# Patient Record
Sex: Male | Born: 1960 | Race: White | Hispanic: No | Marital: Single | Smoking: Never smoker
Health system: Southern US, Community
[De-identification: ages and names within clinical notes are randomized; demographics above are authoritative.]

## PROBLEM LIST (undated history)

## (undated) DIAGNOSIS — F32A Depression, unspecified: Secondary | ICD-10-CM

## (undated) DIAGNOSIS — K219 Gastro-esophageal reflux disease without esophagitis: Secondary | ICD-10-CM

## (undated) DIAGNOSIS — I10 Essential (primary) hypertension: Secondary | ICD-10-CM

## (undated) DIAGNOSIS — F329 Major depressive disorder, single episode, unspecified: Secondary | ICD-10-CM

## (undated) HISTORY — DX: Essential (primary) hypertension: I10

## (undated) HISTORY — DX: Depression, unspecified: F32.A

## (undated) HISTORY — DX: Major depressive disorder, single episode, unspecified: F32.9

## (undated) HISTORY — DX: Gastro-esophageal reflux disease without esophagitis: K21.9

---

## 2005-09-03 ENCOUNTER — Ambulatory Visit: Payer: Self-pay | Admitting: Unknown Physician Specialty

## 2009-05-10 ENCOUNTER — Ambulatory Visit: Payer: Self-pay | Admitting: Family Medicine

## 2009-05-11 ENCOUNTER — Ambulatory Visit: Payer: Self-pay | Admitting: Family Medicine

## 2009-07-13 ENCOUNTER — Encounter: Admission: RE | Admit: 2009-07-13 | Discharge: 2009-07-13 | Payer: Self-pay | Admitting: Occupational Medicine

## 2009-12-20 IMAGING — CR DG CHEST 2V
1 series · 3 of 3 positions shown · non-contrast
Comparison: none

REASON FOR EXAM: Pain due to trauma, left lower rib detail
COMMENTS:

[Series 1: view not recorded · 0.17mm/px · 3 of 3 slices shown]
[im 1/3]
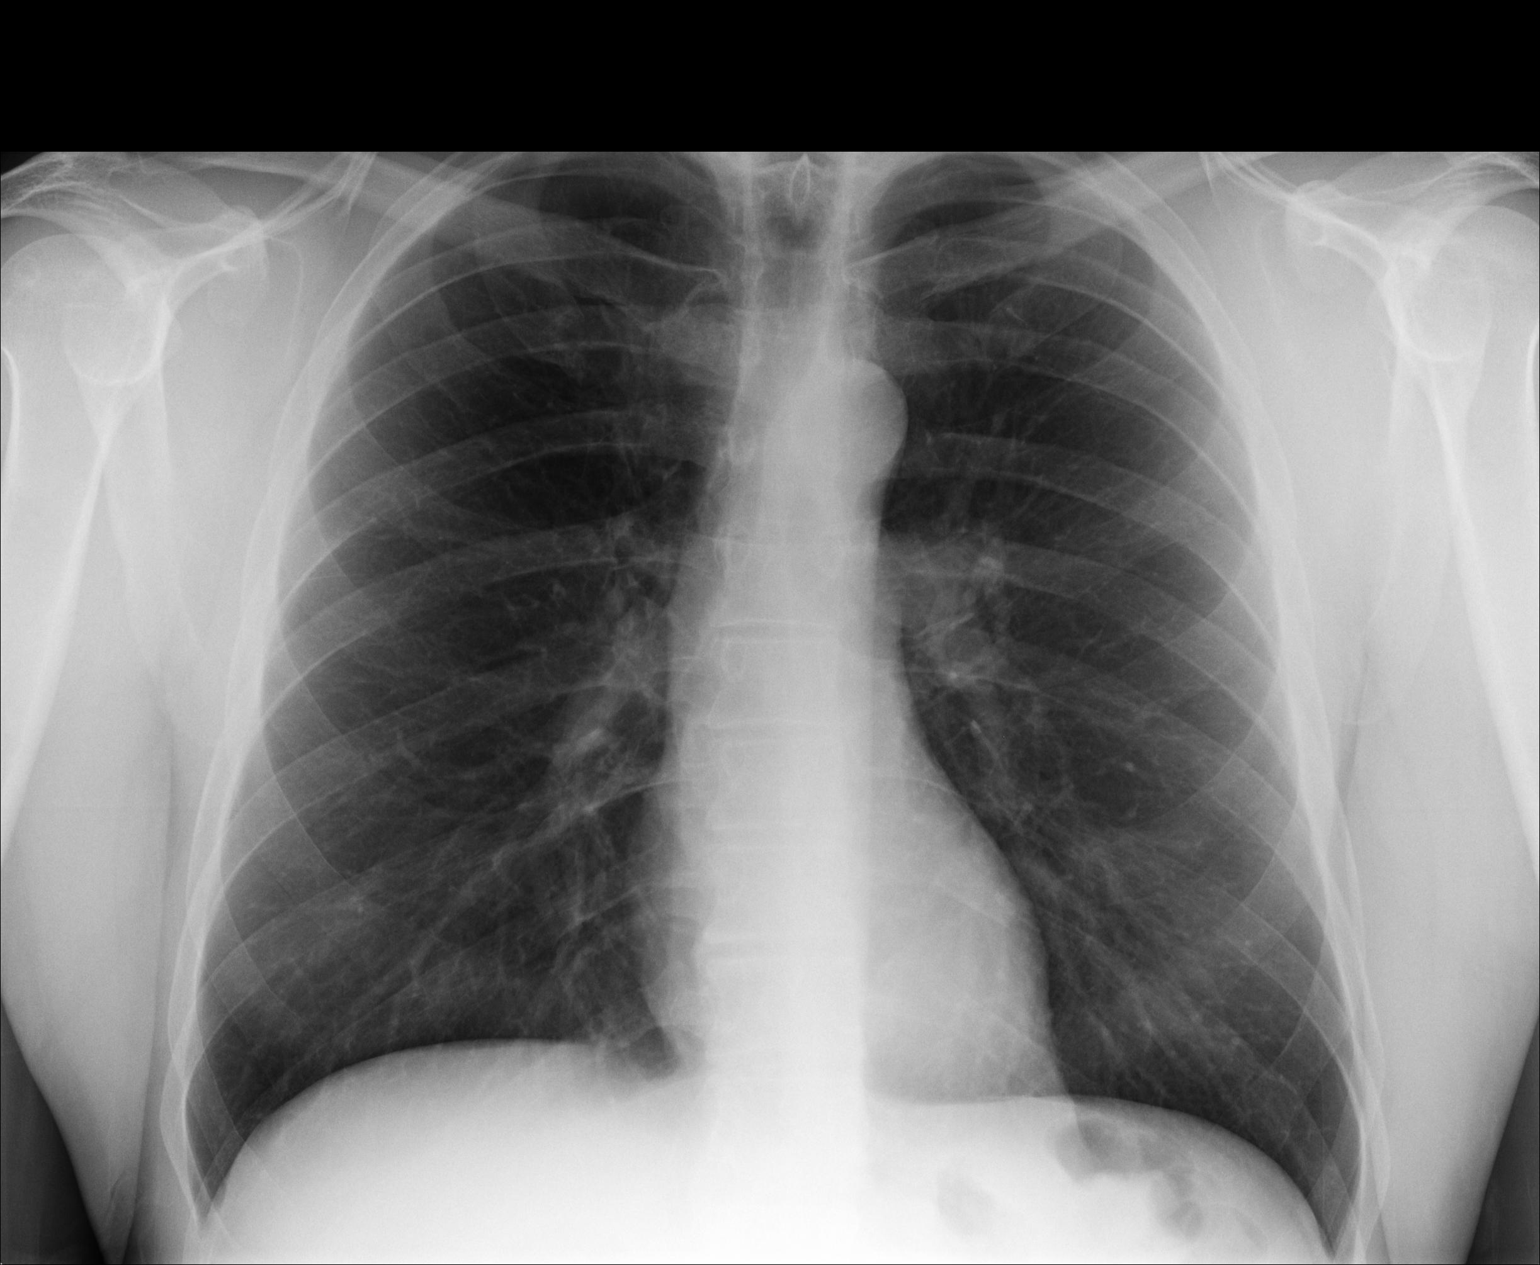
[im 2/3]
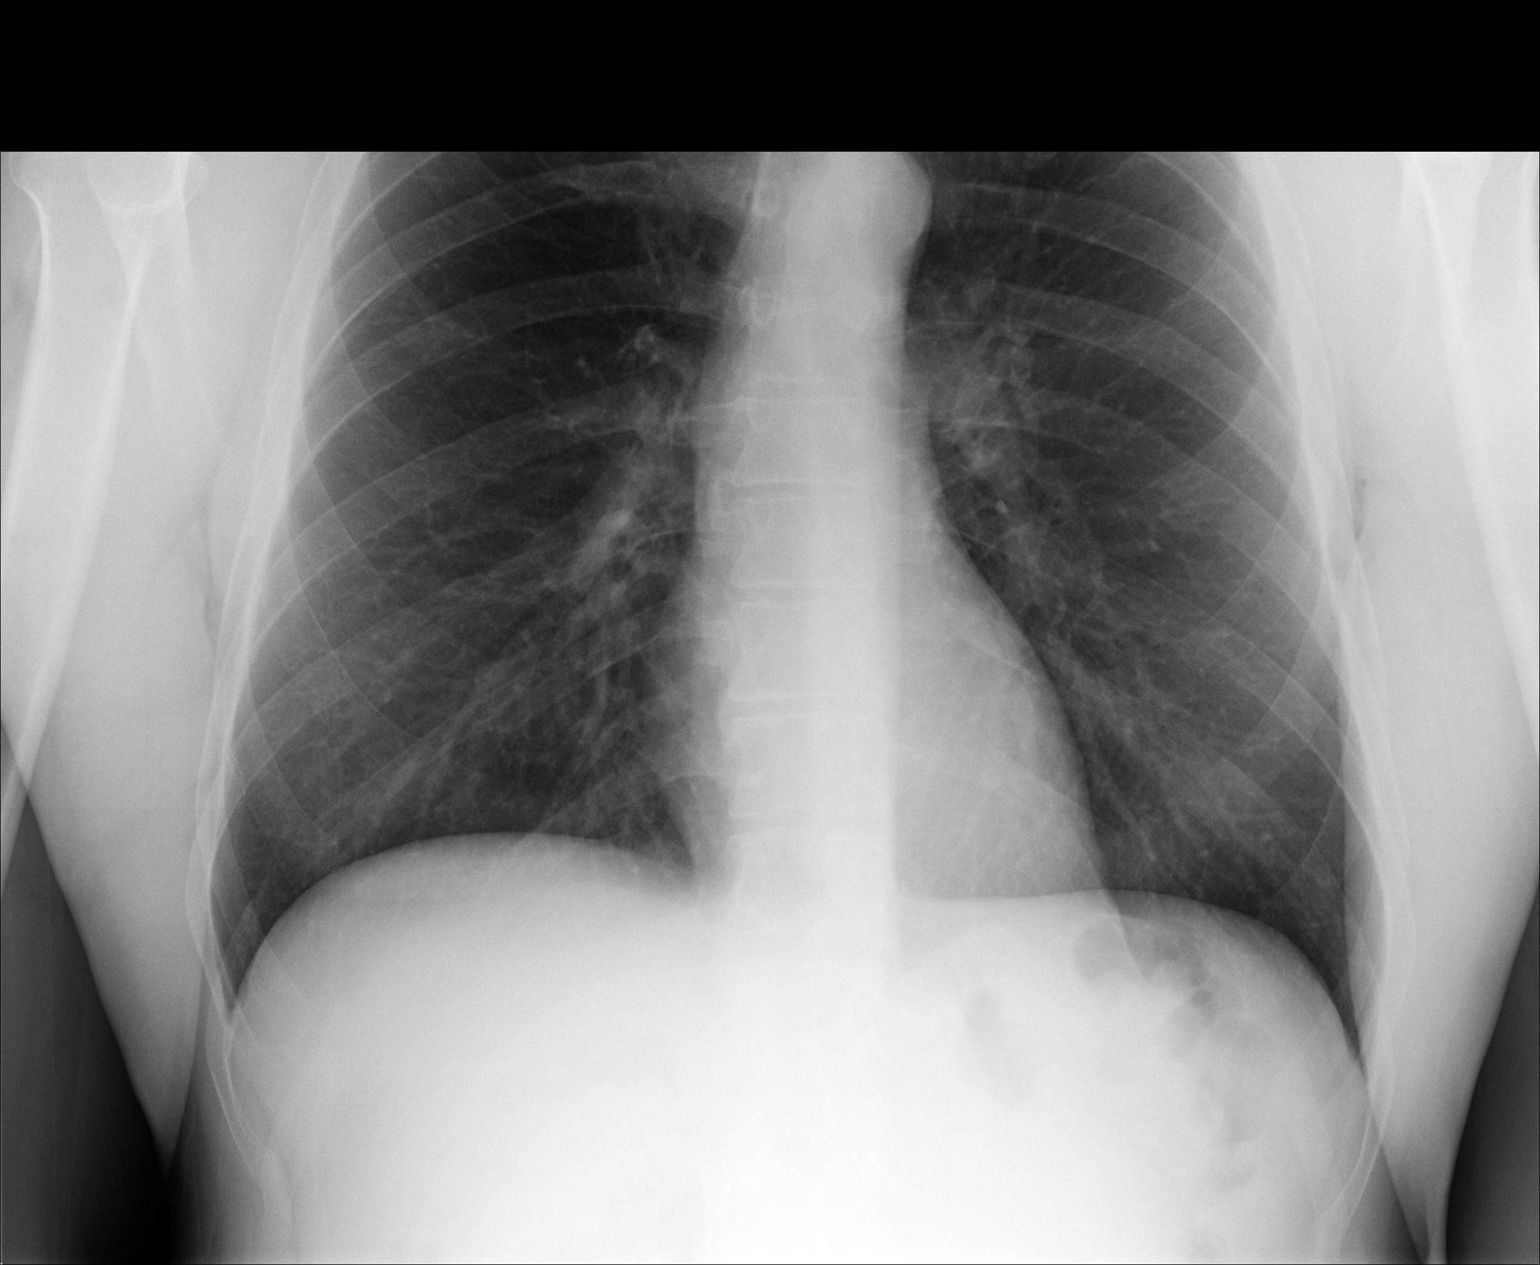
[im 3/3]
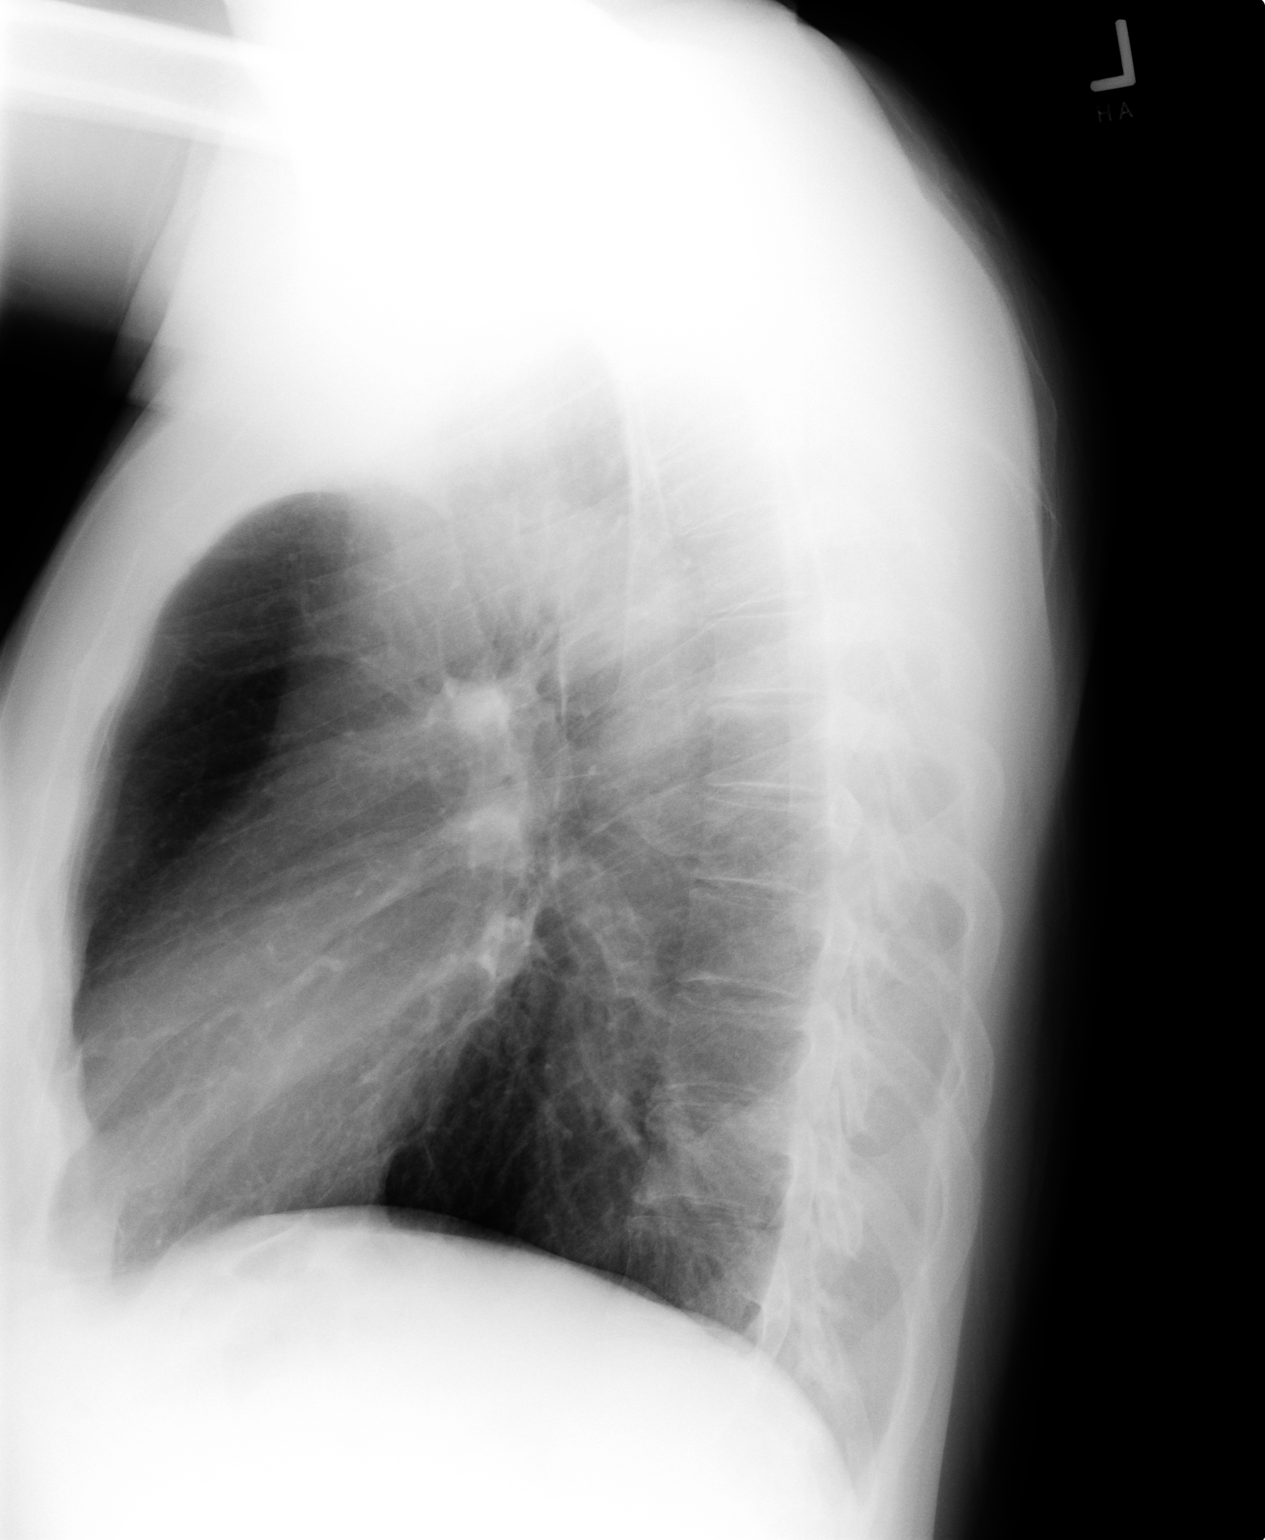

[3 of 3 positions shown; findings below may reference images not displayed]

PROCEDURE:     KDR - KDXR CHEST PA (OR AP) AND LAT  - May 10, 2009 [DATE]

RESULT:     The lung fields are clear. No pneumonia, pneumothorax or pleural
fusion is seen. The chest appears mildly hyperexpanded suspicious for a
history of COPD or asthma. No rib fracture is identified on the current
chest radiograph.
IMPRESSION: 1. No acute changes are identified.
2. The chest appears hyperexpanded.

## 2009-12-21 IMAGING — CT CT ABDOMEN W/ CM
1 of 2 series · 15 of 32 positions shown, 19 images · non-contrast
Comparison: None

REASON FOR EXAM: abd pain injury to ribs eval for rib fx or spleen trauma
COMMENTS:

PROCEDURE:     CT  - CT ABDOMEN STANDARD W  - May 11, 2009  [DATE]
RESULT:     History: Abdominal pain
TECHNIQUE: Multiple axial images of the abdomen were performed from the lung
bases to the iliac crests, with p.o. contrast and with one or milliliters of
Bsovue-AOR intravenous contrast.

[Series 2: abdomen · axial · 0.72mm/px · z∈[-952,-652]mm · 15 of 66 slices shown, 19 images]
[im 3/66  soft-tissue]
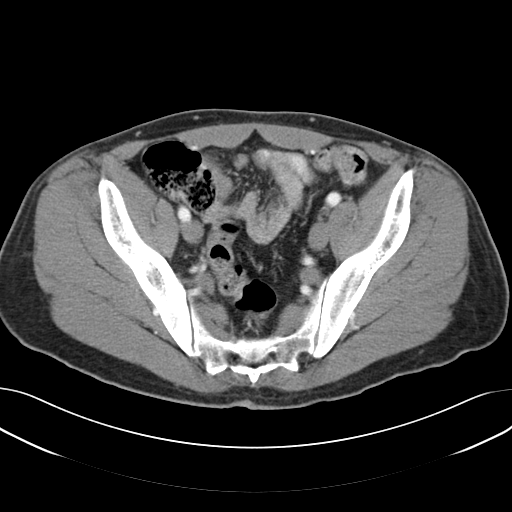
[im 3/66  bone]
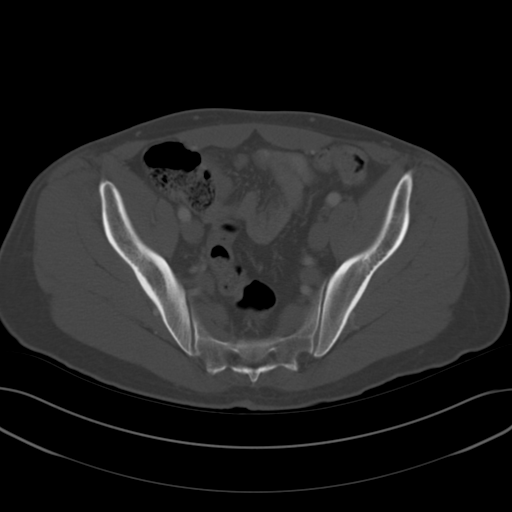
[im 9/66  soft-tissue]
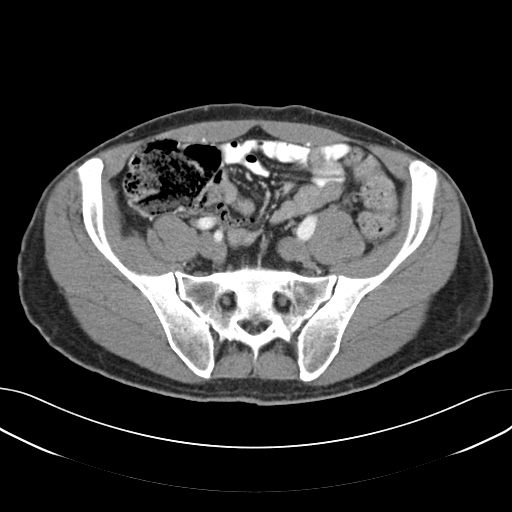
[im 14/66  soft-tissue]
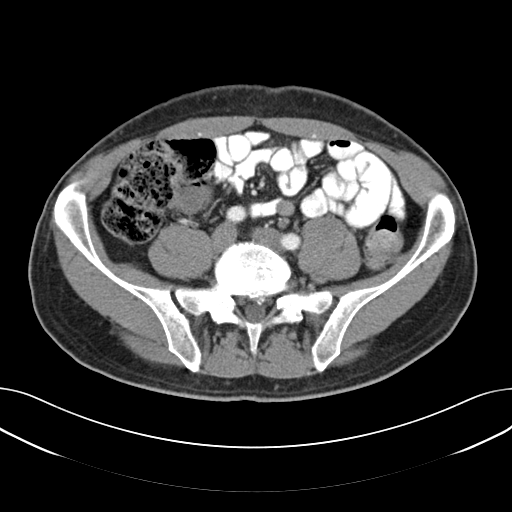
[im 19/66  soft-tissue]
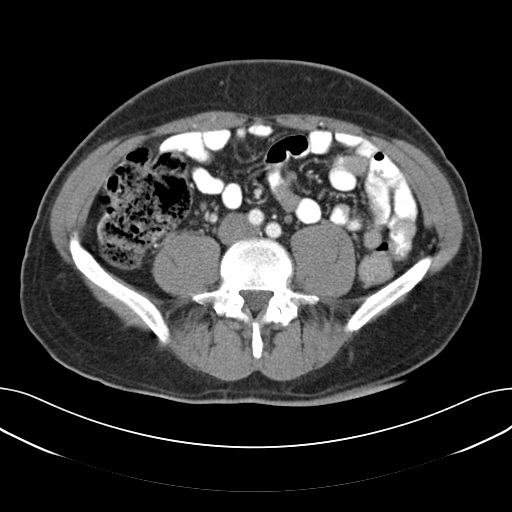
[im 22/66  soft-tissue]
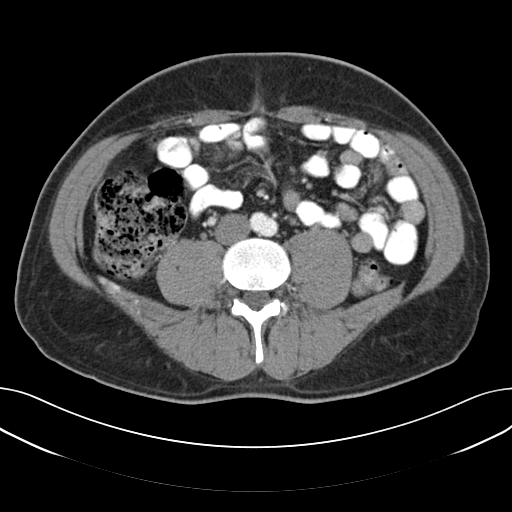
[im 28/66  soft-tissue]
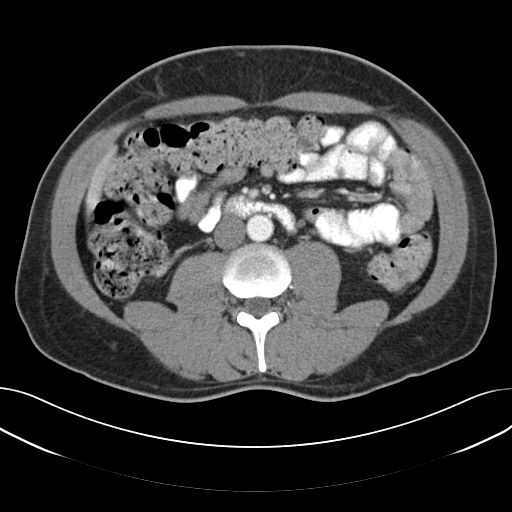
[im 33/66  soft-tissue]
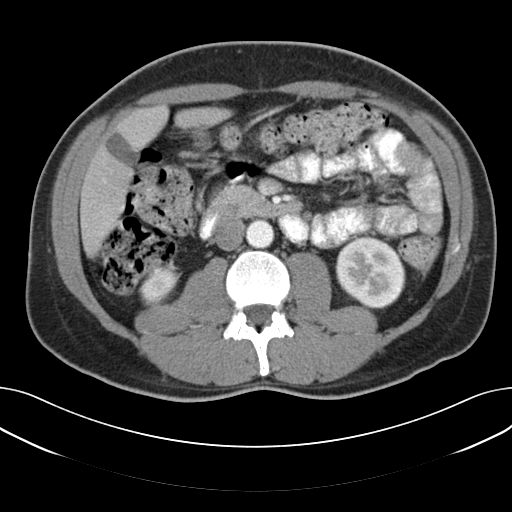
[im 38/66  soft-tissue]
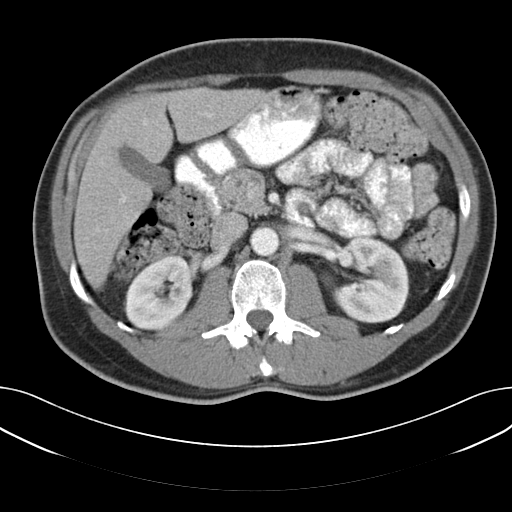
[im 44/66  soft-tissue]
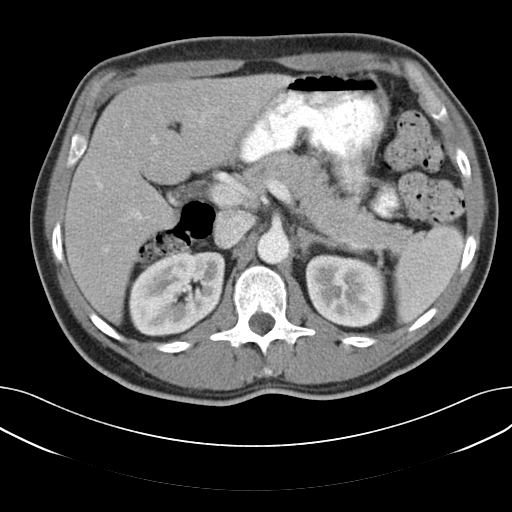
[im 44/66  bone]
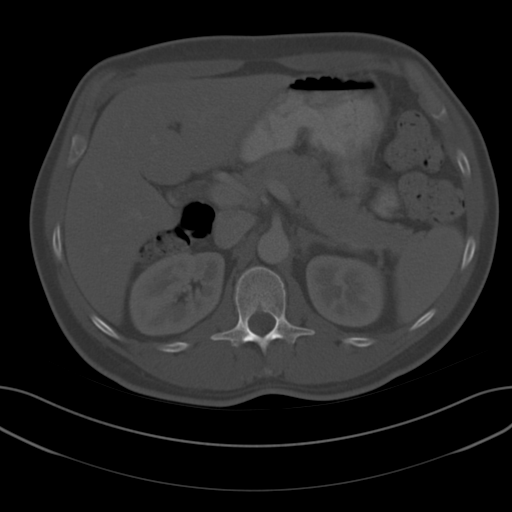
[im 47/66  soft-tissue]
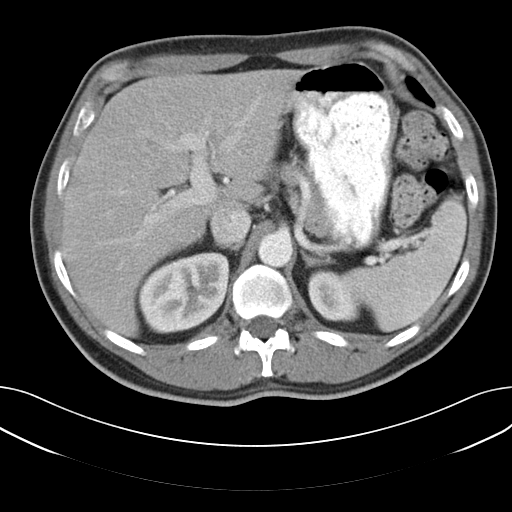
[im 52/66  soft-tissue]
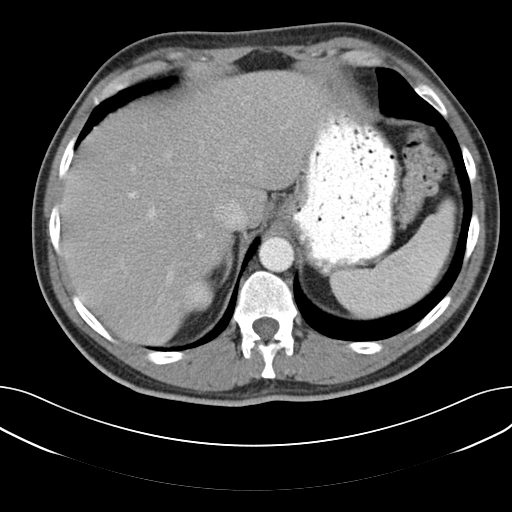
[im 55/66  lung]
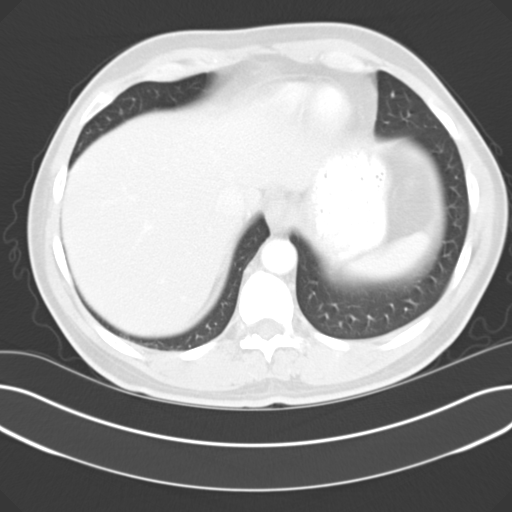
[im 57/66  soft-tissue]
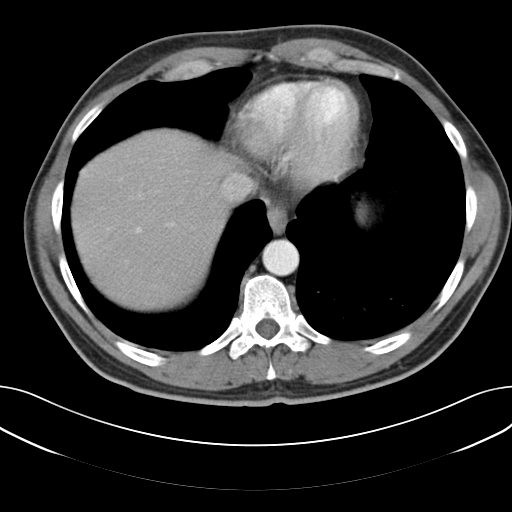
[im 57/66  lung]
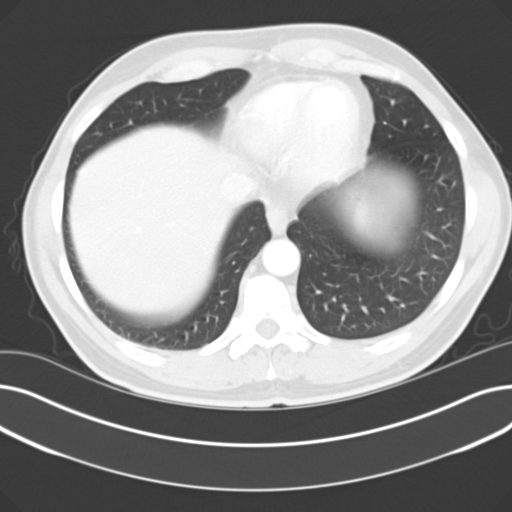
[im 60/66  lung]
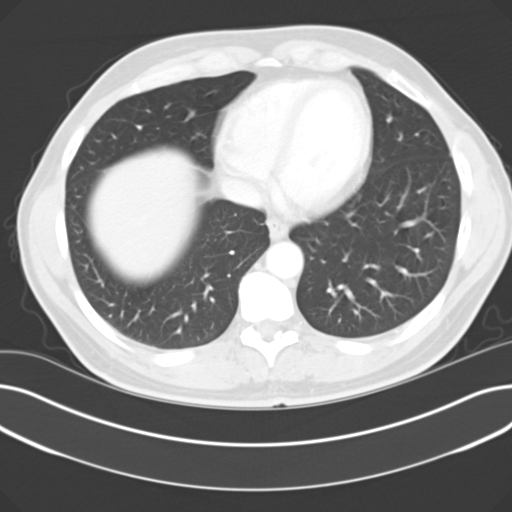
[im 63/66  soft-tissue]
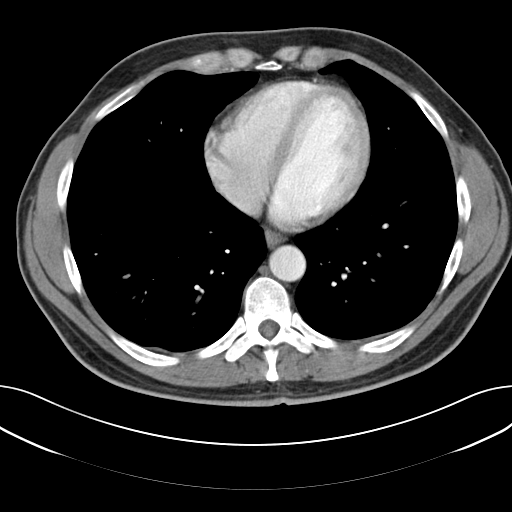
[im 63/66  lung]
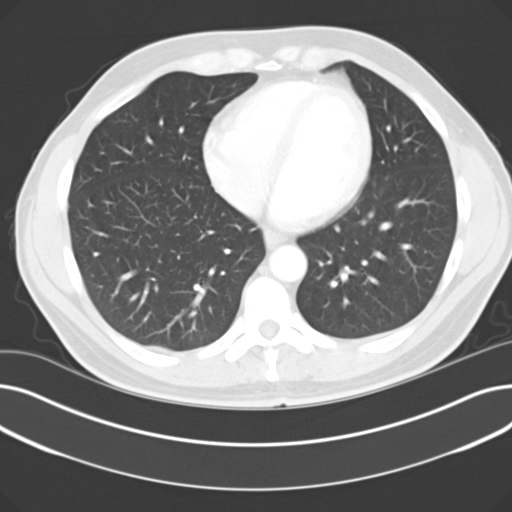

[15 of 32 positions shown; findings below may reference images not displayed]

FINDINGS: The lung bases are clear. There is no pneumothorax. The heart size is
normal.

The liver demonstrates no focal abnormality. There is no intrahepatic or
extrahepatic biliary ductal dilatation. The gallbladder is unremarkable. The
spleen demonstrates no focal abnormality. The kidneys, adrenal glands, and
pancreas are normal.

The visualized portions of the stomach, duodenum, small intestine, and large
intestine demonstrate no contrast extravasation or dilatation. There is no
pneumoperitoneum, pneumatosis, or portal venous gas. There is no abdominal
free fluid. There is no lymphadenopathy.

The abdominal aorta is normal in caliber.

The osseous structures are unremarkable.
IMPRESSION: No acute abdominal injury.

## 2010-03-04 ENCOUNTER — Ambulatory Visit: Payer: Self-pay | Admitting: Family Medicine

## 2010-10-14 IMAGING — CR LEFT INDEX FINGER 2+V
1 series · 3 of 3 positions shown · non-contrast
Comparison: none

REASON FOR EXAM: pain from trauma
COMMENTS:

[Series 1: view not recorded · 0.17mm/px · 3 of 3 slices shown]
[im 1/3]
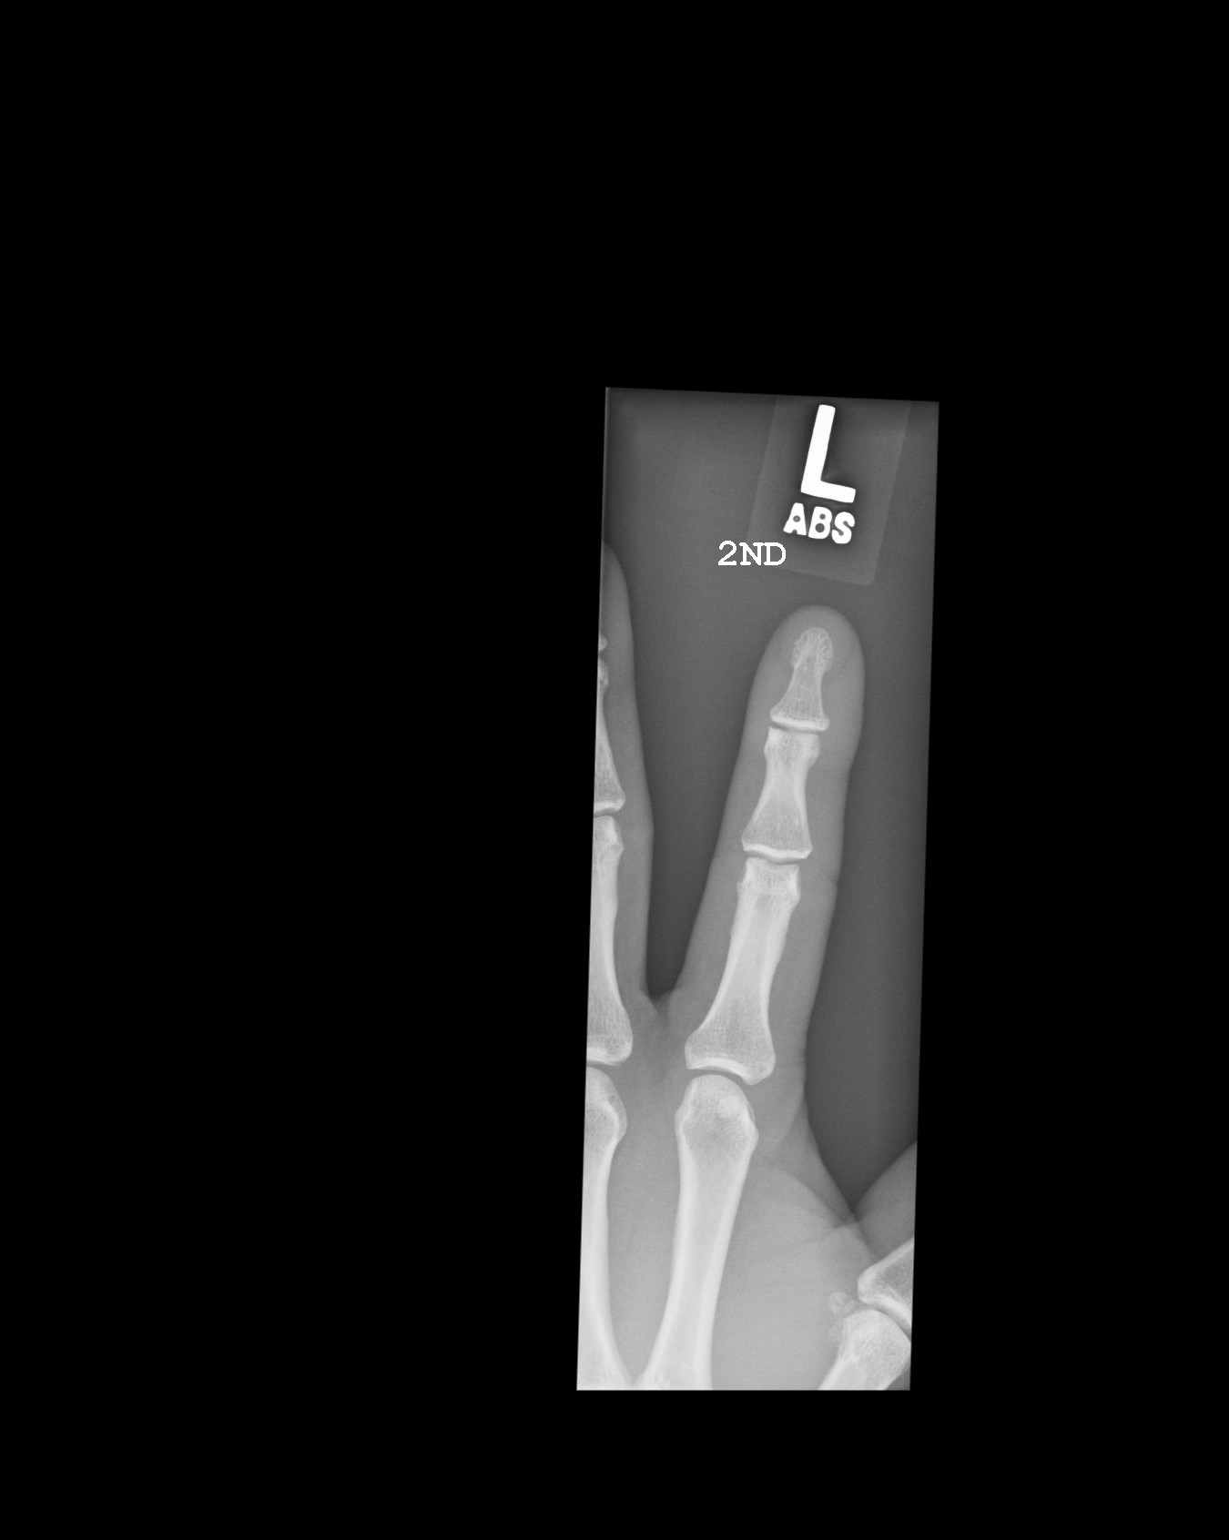
[im 2/3]
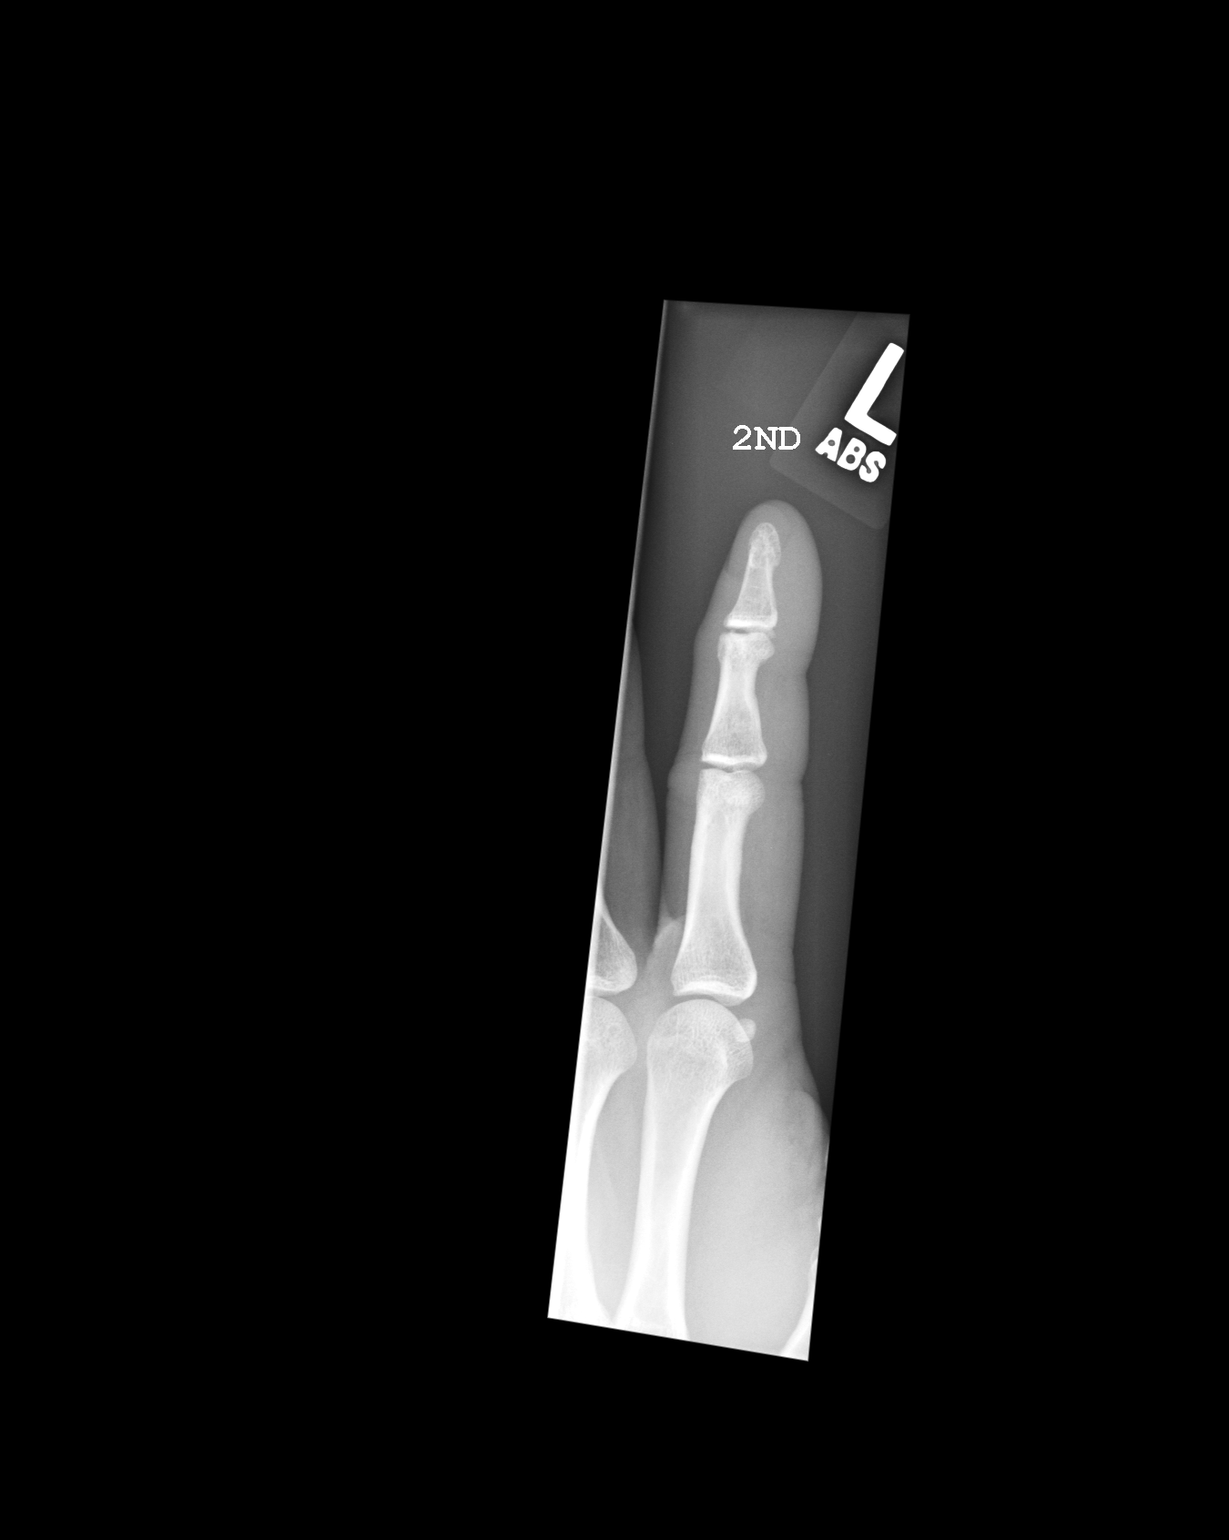
[im 3/3]
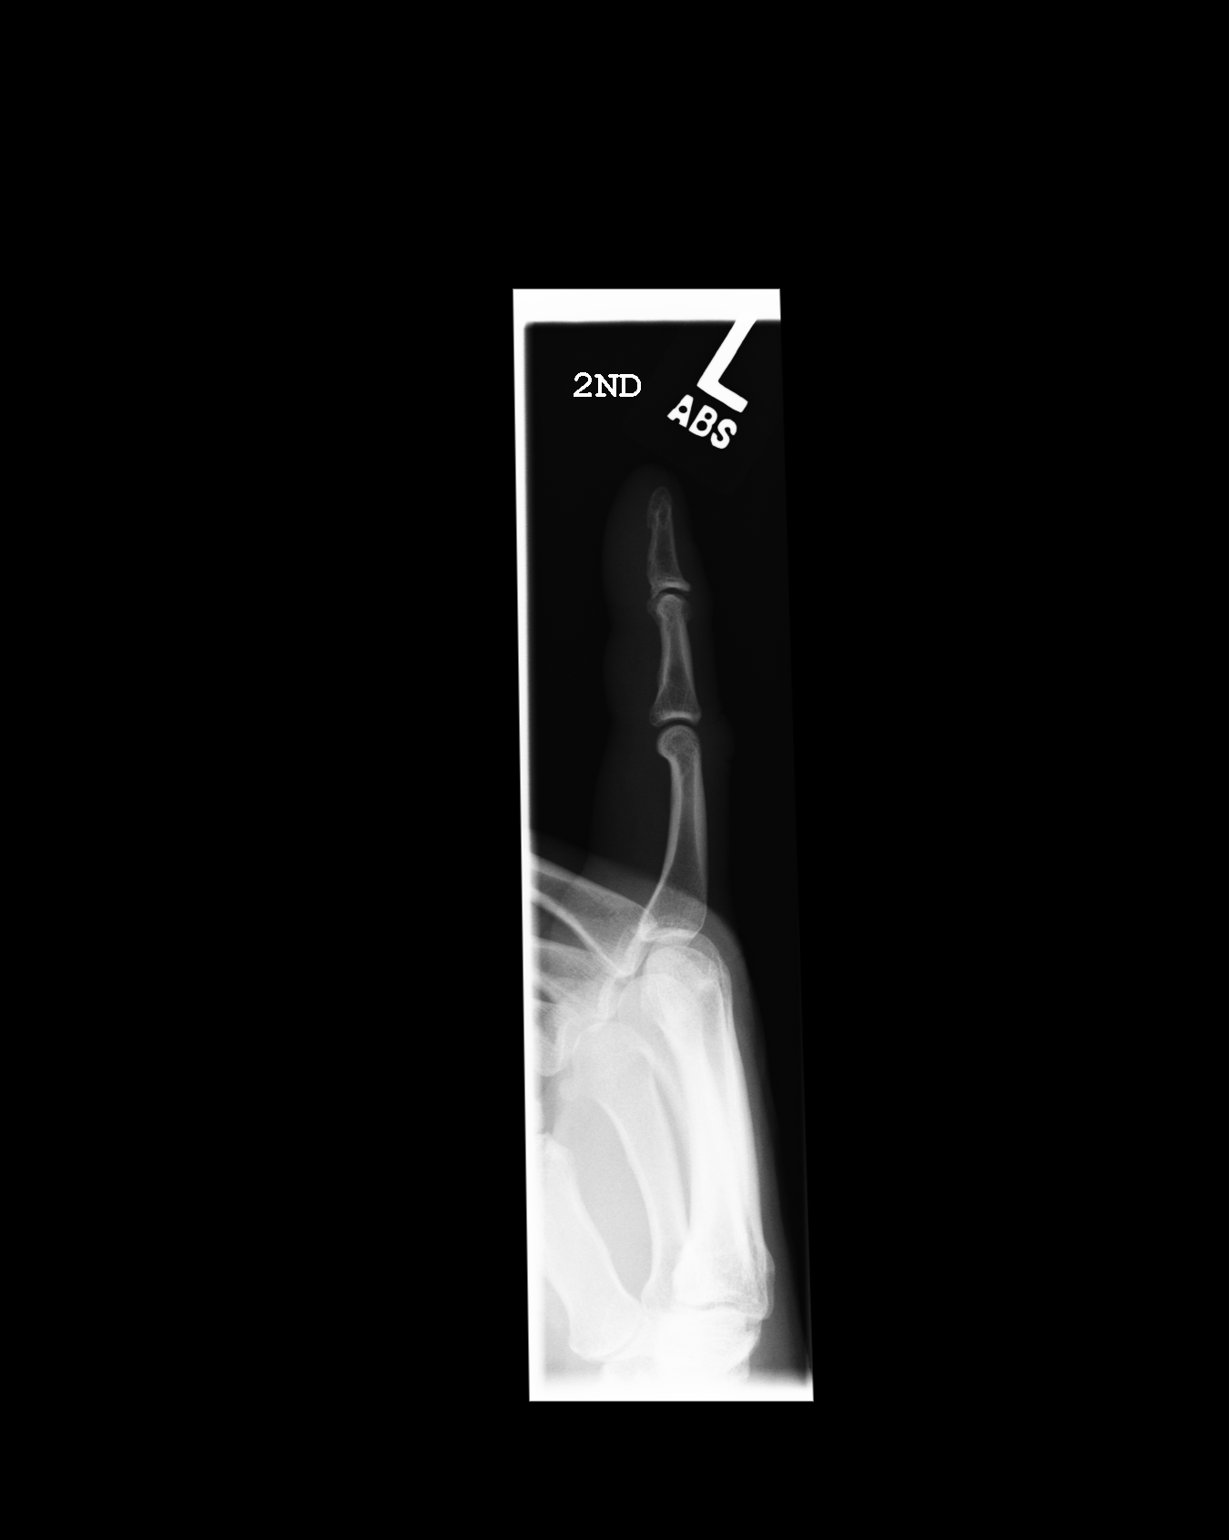

[3 of 3 positions shown; findings below may reference images not displayed]

PROCEDURE:     DXR - DXR FINGER INDEX 2ND DIGIT LT HA  - March 04, 2010 [DATE]

RESULT:     Three views of the right index finger are submitted. The bones
appear adequately mineralized. I do not see evidence of an acute fracture.
There is very mild degenerative change of the DIP joint. The overlying soft
tissues are grossly normal.
IMPRESSION: I do not see objective evidence of acute fracture or
dislocation of the left index finger. There is mild degenerative change of
the DIP joint.

## 2011-02-04 ENCOUNTER — Ambulatory Visit: Payer: Self-pay | Admitting: Unknown Physician Specialty

## 2011-02-05 LAB — PATHOLOGY REPORT

## 2013-11-03 HISTORY — PX: ESOPHAGOGASTRODUODENOSCOPY ENDOSCOPY: SHX5814

## 2015-03-29 LAB — BASIC METABOLIC PANEL
BUN: 18 mg/dL (ref 4–21)
Creatinine: 1.1 mg/dL (ref 0.6–1.3)
Potassium: 4.5 mmol/L (ref 3.4–5.3)
SODIUM: 141 mmol/L (ref 137–147)

## 2015-03-29 LAB — HEPATIC FUNCTION PANEL
ALK PHOS: 89 U/L (ref 25–125)
ALT: 23 U/L (ref 10–40)
AST: 19 U/L (ref 14–40)
Bilirubin, Total: 0.3 mg/dL

## 2015-03-29 LAB — TSH: TSH: 1.91 u[IU]/mL (ref 0.41–5.90)

## 2015-04-09 ENCOUNTER — Other Ambulatory Visit: Payer: Self-pay | Admitting: Emergency Medicine

## 2015-04-09 DIAGNOSIS — K2270 Barrett's esophagus without dysplasia: Secondary | ICD-10-CM | POA: Insufficient documentation

## 2015-04-09 DIAGNOSIS — F329 Major depressive disorder, single episode, unspecified: Secondary | ICD-10-CM | POA: Insufficient documentation

## 2015-04-09 DIAGNOSIS — Z8659 Personal history of other mental and behavioral disorders: Secondary | ICD-10-CM | POA: Insufficient documentation

## 2015-04-09 DIAGNOSIS — F32A Depression, unspecified: Secondary | ICD-10-CM | POA: Insufficient documentation

## 2015-04-09 DIAGNOSIS — I10 Essential (primary) hypertension: Secondary | ICD-10-CM | POA: Insufficient documentation

## 2015-04-09 DIAGNOSIS — Z8249 Family history of ischemic heart disease and other diseases of the circulatory system: Secondary | ICD-10-CM | POA: Insufficient documentation

## 2015-04-26 ENCOUNTER — Other Ambulatory Visit: Payer: Self-pay

## 2015-04-26 MED ORDER — LISINOPRIL 10 MG PO TABS: 10.0000 mg | ORAL_TABLET | Freq: Every day | ORAL | Status: DC

## 2015-04-27 ENCOUNTER — Encounter: Payer: Self-pay | Admitting: Family Medicine

## 2015-05-10 ENCOUNTER — Ambulatory Visit (INDEPENDENT_AMBULATORY_CARE_PROVIDER_SITE_OTHER): Payer: 59 | Admitting: Family Medicine

## 2015-05-10 ENCOUNTER — Encounter: Payer: Self-pay | Admitting: Family Medicine

## 2015-05-10 VITALS — BP 128/84 | HR 92 | Temp 98.4°F | Resp 16 | Wt 227.0 lb

## 2015-05-10 DIAGNOSIS — F329 Major depressive disorder, single episode, unspecified: Secondary | ICD-10-CM | POA: Diagnosis not present

## 2015-05-10 DIAGNOSIS — F32A Depression, unspecified: Secondary | ICD-10-CM

## 2015-05-10 DIAGNOSIS — I10 Essential (primary) hypertension: Secondary | ICD-10-CM | POA: Diagnosis not present

## 2015-05-10 NOTE — Progress Notes (Signed)
Patient ID: James Arellano, male   DOB: February 14, 1961, 54 y.o.   MRN: [id number]    Subjective:  HPI  Hypertension  Patient is here for 1 month follow up. Last visit was in May to get established and Lisinopril was started due to B/p at that time was 150/78. He has not been checking his B/P. He has not had any side effects from the medication. He is not having any symptoms of concern.  His last lab work was done in May during his last visit and levels were ok.  Prior to Admission medications   Medication Sig Start Date End Date Taking? Authorizing Provider  citalopram (CELEXA) 20 MG tablet Take by mouth.   Yes Historical Provider, MD  lisinopril (PRINIVIL,ZESTRIL) 10 MG tablet Take 1 tablet (10 mg total) by mouth daily. 04/26/15  Yes Richard Hulen ShoutsL Gilbert Jr., MD  Multiple Vitamin (MULTIVITAMIN) tablet Take 1 tablet by mouth daily.   Yes Historical Provider, MD  omeprazole (PRILOSEC) 40 MG capsule Take by mouth.   Yes Historical Provider, MD    Patient Active Problem List   Diagnosis Date Noted  . Barrett esophagus 04/09/2015  . H/O: depression 04/09/2015  . Family history of coronary arteriosclerosis 04/09/2015  . Benign hypertension 04/09/2015  . Depression 04/09/2015    Past Medical History  Diagnosis Date  . Depression     History   Social History  . Marital Status: Single    Spouse Name: single  . Number of Children: 2  . Years of Education: 19   Occupational History  . employeed with [place] [employer]    Social History Main Topics  . Smoking status: Never Smoker   . Smokeless tobacco: Never Used  . Alcohol Use: Yes     Comment: wine ususally on the weekend  . Drug Use: No  . Sexual Activity: Not on file   Other Topics Concern  . Not on file   Social History Narrative    No Known Allergies  Review of Systems  Constitutional: Negative for fever, chills, weight loss and malaise/fatigue.  Respiratory: Negative for cough, hemoptysis, sputum  production, shortness of breath and wheezing.   Cardiovascular: Negative for chest pain, palpitations, claudication and leg swelling.  Gastrointestinal: Negative for heartburn, nausea and vomiting.  Neurological: Negative for dizziness, tingling, tremors, weakness and headaches.     There is no immunization history on file for this patient. Objective:  BP 128/84 mmHg  Pulse 92  Temp(Src) 98.4 F (36.9 C)  Resp 16  Wt 227 lb (102.967 kg)  Physical Exam  Constitutional: He is oriented to person, place, and time and well-developed, well-nourished, and in no distress. No distress.  Eyes: Conjunctivae are normal. Pupils are equal, round, and reactive to light.  Neck: Normal range of motion. Neck supple.  Cardiovascular: Normal rate, regular rhythm, normal heart sounds and intact distal pulses.   No murmur heard. Pulmonary/Chest: Breath sounds normal. No respiratory distress. He has no wheezes. He has no rales.  Musculoskeletal: Normal range of motion. He exhibits no edema or tenderness.  Neurological: He is alert and oriented to person, place, and time. Gait normal.  Psychiatric: Mood, memory, affect and judgment normal.    Lab Results  Component Value Date   TSH 1.91 03/29/2015    CMP     Component Value Date/Time   NA 141 03/29/2015   K 4.5 03/29/2015   BUN 18 03/29/2015   CREATININE 1.1 03/29/2015   AST 19 03/29/2015  ALT 23 03/29/2015   ALKPHOS 89 03/29/2015    Assessment and Plan :  1. Benign hypertension Improved. Continue current medication.  2. Depression Follows psych in [place]. Stable. Followed by Dr. Bufford Buttner. Stable and doing well.  3. Reflux Stable on Prilosec. CPE later this year.   Patient was seen and examined by Dr. Bosie Clos and note was scribed by Samara Deist, RMA.   Julieanne Manson MD [facility] 05/10/2015 4:00 PM

## 2015-06-15 DIAGNOSIS — Z8719 Personal history of other diseases of the digestive system: Secondary | ICD-10-CM | POA: Insufficient documentation

## 2015-06-15 DIAGNOSIS — K219 Gastro-esophageal reflux disease without esophagitis: Secondary | ICD-10-CM | POA: Insufficient documentation

## 2015-08-23 ENCOUNTER — Encounter: Payer: Self-pay | Admitting: Family Medicine

## 2015-08-23 ENCOUNTER — Ambulatory Visit (INDEPENDENT_AMBULATORY_CARE_PROVIDER_SITE_OTHER): Payer: 59 | Admitting: Family Medicine

## 2015-08-23 VITALS — BP 112/80 | HR 68 | Temp 98.0°F | Resp 16 | Ht 72.0 in | Wt 220.0 lb

## 2015-08-23 DIAGNOSIS — Z125 Encounter for screening for malignant neoplasm of prostate: Secondary | ICD-10-CM

## 2015-08-23 DIAGNOSIS — Z0000 Encounter for general adult medical examination without abnormal findings: Secondary | ICD-10-CM | POA: Diagnosis not present

## 2015-08-23 LAB — POCT URINALYSIS DIPSTICK
BILIRUBIN UA: NEGATIVE
Blood, UA: NEGATIVE
GLUCOSE UA: NEGATIVE
KETONES UA: NEGATIVE
LEUKOCYTES UA: NEGATIVE
NITRITE UA: NEGATIVE
PH UA: 7
Protein, UA: NEGATIVE
Spec Grav, UA: 1.01
Urobilinogen, UA: 0.2

## 2015-08-23 NOTE — Progress Notes (Signed)
Patient ID: James Arellano, male   DOB: [birth year], 54 y.o.   MRN: [id number]       Patient: James Arellano, Male    DOB: Feb 18, 1961, 54 y.o.   MRN: [id number] Visit Date: 08/23/2015  Today's Provider: Megan Mans, MD   Chief Complaint  Patient presents with  . Annual Exam   Subjective:    Annual physical exam James Arellano is a 54 y.o. male who presents today for health maintenance and complete physical. He feels well. He reports exercising " not as much as I should. He reports he is sleeping well.  -----------------------------------------------------------------  Tdap- unsure, when he had it. But had flu vaccine about 1 week ago so can not get vaccine today.  Colonoscopy- 10/2014 EKG- 03/29/15 Flu vaccine 08/2015   Review of Systems  Constitutional: Negative.   HENT: Negative.   Eyes: Negative.   Respiratory: Negative.   Cardiovascular: Negative.   Gastrointestinal: Negative.   Endocrine: Negative.   Genitourinary: Negative.   Musculoskeletal: Negative.   Skin: Negative.   Allergic/Immunologic: Negative.   Neurological: Negative.   Hematological: Negative.   Psychiatric/Behavioral: Negative.     Social History He  reports that he has never smoked. He has never used smokeless tobacco. He reports that he drinks alcohol. He reports that he does not use illicit drugs. Social History   Social History  . Marital Status: Single    Spouse Name: single  . Number of Children: 2  . Years of Education: 19   Occupational History  . employeed with [employer] [employer]    Social History Main Topics  . Smoking status: Never Smoker   . Smokeless tobacco: Never Used  . Alcohol Use: Yes     Comment: wine ususally on the weekend  . Drug Use: No  . Sexual Activity: Not Asked   Other Topics Concern  . None   Social History Narrative    Patient Active Problem List   Diagnosis Date Noted  . Gastro-esophageal reflux disease without  esophagitis 06/15/2015  . Personal history of other diseases of the digestive system 06/15/2015  . Barrett esophagus 04/09/2015  . H/O: depression 04/09/2015  . Family history of coronary arteriosclerosis 04/09/2015  . Benign hypertension 04/09/2015  . Depression 04/09/2015    Past Surgical History  Procedure Laterality Date  . Esophagogastroduodenoscopy endoscopy  2015    Family History  Family Status  Relation Status Death Age  . Mother Deceased 32    Due to natural causes  . Father Deceased 65    cause of death MI  . Sister Alive   . Brother Alive   . Sister Alive   . Sister Alive   . Brother Alive   . Brother Deceased 4's    Cause of death- Kidney failure, alcoholism  . Brother Alive   . Brother Alive    His family history includes Dementia in his mother; GER disease in his brother; Heart disease in his father; Heart disease (age of onset: 72's) in his brother; Heart disease (age of onset: 20's) in his brother; Peptic Ulcer Disease in his brother.    No Known Allergies  Previous Medications   CITALOPRAM (CELEXA) 20 MG TABLET    Take by mouth.   LISINOPRIL (PRINIVIL,ZESTRIL) 10 MG TABLET    Take 1 tablet (10 mg total) by mouth daily.   MULTIPLE VITAMIN (MULTIVITAMIN) TABLET    Take 1 tablet by mouth daily.   OMEPRAZOLE (PRILOSEC) 40 MG CAPSULE  Take by mouth.    Patient Care Team: Maple Hudsonichard L Shaylan Tutton Jr., MD as PCP - General (Family Medicine)     Objective:   Vitals: BP 112/80 mmHg  Pulse 68  Temp(Src) 98 F (36.7 C) (Oral)  Resp 16  Ht 6' (1.829 m)  Wt 220 lb (99.791 kg)  BMI 29.83 kg/m2   Physical Exam  Constitutional: He is oriented to person, place, and time. He appears well-developed and well-nourished.  HENT:  Head: Normocephalic and atraumatic.  Right Ear: External ear normal.  Left Ear: External ear normal.  Nose: Nose normal.  Eyes: Conjunctivae are normal.  Neck: Neck supple.  Cardiovascular: Normal rate, regular rhythm and normal heart  sounds.   Pulmonary/Chest: Effort normal and breath sounds normal.  Abdominal: Soft.  Neurological: He is alert and oriented to person, place, and time.  Skin: Skin is warm and dry.  Psychiatric: He has a normal mood and affect. His behavior is normal. Judgment and thought content normal.     Depression Screen No flowsheet data found.    Assessment & Plan:     Routine Health Maintenance and Physical Exam  Exercise Activities and Dietary recommendations Goals    None       There is no immunization history on file for this patient.  Health Maintenance  Topic Date Due  . Hepatitis C Screening  17-Mar-1961  . HIV Screening  03/27/1977  . TETANUS/TDAP  03/27/1981  . COLONOSCOPY  03/27/2012  . INFLUENZA VACCINE  08/22/2016 (Originally 06/04/2015)      Discussed health benefits of physical activity, and encouraged him to engage in regular exercise appropriate for his age and condition.   GERD Pt needs long term PPI I have done the exam and reviewed the above chart and it is accurate to the best of my knowledge.  --------------------------------------------------------------------

## 2015-08-24 LAB — CBC WITH DIFFERENTIAL/PLATELET
BASOS: 0 %
Basophils Absolute: 0 10*3/uL (ref 0.0–0.2)
EOS (ABSOLUTE): 0.1 10*3/uL (ref 0.0–0.4)
EOS: 1 %
HEMATOCRIT: 42.8 % (ref 37.5–51.0)
Hemoglobin: 14.6 g/dL (ref 12.6–17.7)
IMMATURE GRANS (ABS): 0 10*3/uL (ref 0.0–0.1)
IMMATURE GRANULOCYTES: 0 %
LYMPHS: 28 %
Lymphocytes Absolute: 2 10*3/uL (ref 0.7–3.1)
MCH: 31.2 pg (ref 26.6–33.0)
MCHC: 34.1 g/dL (ref 31.5–35.7)
MCV: 92 fL (ref 79–97)
MONOCYTES: 5 %
Monocytes Absolute: 0.4 10*3/uL (ref 0.1–0.9)
NEUTROS PCT: 66 %
Neutrophils Absolute: 4.8 10*3/uL (ref 1.4–7.0)
PLATELETS: 360 10*3/uL (ref 150–379)
RBC: 4.68 x10E6/uL (ref 4.14–5.80)
RDW: 13.4 % (ref 12.3–15.4)
WBC: 7.3 10*3/uL (ref 3.4–10.8)

## 2015-08-24 LAB — LIPID PANEL WITH LDL/HDL RATIO
Cholesterol, Total: 227 mg/dL — ABNORMAL HIGH (ref 100–199)
HDL: 61 mg/dL (ref >39)
LDL Calculated: 153 mg/dL — ABNORMAL HIGH (ref 0–99)
LDL/HDL RATIO: 2.5 ratio (ref 0.0–3.6)
TRIGLYCERIDES: 63 mg/dL (ref 0–149)
VLDL CHOLESTEROL CAL: 13 mg/dL (ref 5–40)

## 2015-08-24 LAB — COMPREHENSIVE METABOLIC PANEL
A/G RATIO: 1.8 (ref 1.1–2.5)
ALT: 22 IU/L (ref 0–44)
AST: 19 IU/L (ref 0–40)
Albumin: 4.6 g/dL (ref 3.5–5.5)
Alkaline Phosphatase: 70 IU/L (ref 39–117)
BILIRUBIN TOTAL: 0.7 mg/dL (ref 0.0–1.2)
BUN/Creatinine Ratio: 16 (ref 9–20)
BUN: 17 mg/dL (ref 6–24)
CHLORIDE: 98 mmol/L (ref 97–106)
CO2: 27 mmol/L (ref 18–29)
Calcium: 9.5 mg/dL (ref 8.7–10.2)
Creatinine, Ser: 1.05 mg/dL (ref 0.76–1.27)
GFR calc non Af Amer: 81 mL/min/{1.73_m2} (ref >59)
GFR, EST AFRICAN AMERICAN: 93 mL/min/{1.73_m2} (ref >59)
GLOBULIN, TOTAL: 2.6 g/dL (ref 1.5–4.5)
Glucose: 99 mg/dL (ref 65–99)
POTASSIUM: 4.4 mmol/L (ref 3.5–5.2)
SODIUM: 140 mmol/L (ref 136–144)
TOTAL PROTEIN: 7.2 g/dL (ref 6.0–8.5)

## 2015-08-24 LAB — PSA: Prostate Specific Ag, Serum: 0.8 ng/mL (ref 0.0–4.0)

## 2015-08-24 LAB — TSH: TSH: 1.4 u[IU]/mL (ref 0.450–4.500)

## 2016-02-21 ENCOUNTER — Ambulatory Visit (INDEPENDENT_AMBULATORY_CARE_PROVIDER_SITE_OTHER): Payer: 59 | Admitting: Family Medicine

## 2016-02-21 VITALS — BP 142/88 | HR 92 | Temp 98.3°F | Resp 14 | Wt 215.0 lb

## 2016-02-21 DIAGNOSIS — K219 Gastro-esophageal reflux disease without esophagitis: Secondary | ICD-10-CM | POA: Diagnosis not present

## 2016-02-21 DIAGNOSIS — F32A Depression, unspecified: Secondary | ICD-10-CM

## 2016-02-21 DIAGNOSIS — E785 Hyperlipidemia, unspecified: Secondary | ICD-10-CM | POA: Diagnosis not present

## 2016-02-21 DIAGNOSIS — I10 Essential (primary) hypertension: Secondary | ICD-10-CM

## 2016-02-21 DIAGNOSIS — Z23 Encounter for immunization: Secondary | ICD-10-CM

## 2016-02-21 DIAGNOSIS — F329 Major depressive disorder, single episode, unspecified: Secondary | ICD-10-CM

## 2016-02-21 MED ORDER — LISINOPRIL 20 MG PO TABS: 20.0000 mg | ORAL_TABLET | Freq: Every day | ORAL | Status: DC

## 2016-02-21 NOTE — Progress Notes (Signed)
Patient ID: James Arellano, male   DOB: 1961/05/23, 55 y.o.   MRN: [id number]    Subjective:  HPI  Patient is here for 6 months follow up. Last visit was in October for physical.  Hypertension: Patient has been checking his b/p and he states readings have been a little high but he is not sure of the readings. No cardiac symptoms currently. But patient states that about 1 month ago he had an episode of dizziness while working. He feels like it was related to not eating a lot that day, it lasted for about an hour at the most and after he got something to eat this improved. No issues since then. BP Readings from Last 3 Encounters:  02/21/16 142/88  08/23/15 112/80  05/10/15 128/84   GERD: Symptoms are controlled on Omeprazole 40 mg 1 twice daily.  Hyperlipidemia: Patient is not on any medications presently. He has worked a little on his habits. He is down 5 lbs since last visit. Lab Results  Component Value Date   CHOL 227* 08/23/2015   HDL 61 08/23/2015   LDLCALC 153* 08/23/2015   TRIG 63 08/23/2015     Prior to Admission medications   Medication Sig Start Date End Date Taking? Authorizing Provider  citalopram (CELEXA) 20 MG tablet Take by mouth.    Historical Provider, MD  lisinopril (PRINIVIL,ZESTRIL) 10 MG tablet Take 1 tablet (10 mg total) by mouth daily. 04/26/15   Maple Hudsonichard L Gilbert Jr., MD  Multiple Vitamin (MULTIVITAMIN) tablet Take 1 tablet by mouth daily.    Historical Provider, MD  omeprazole (PRILOSEC) 40 MG capsule Take by mouth.    Historical Provider, MD    Patient Active Problem List   Diagnosis Date Noted  . Gastro-esophageal reflux disease without esophagitis 06/15/2015  . Personal history of other diseases of the digestive system 06/15/2015  . Barrett esophagus 04/09/2015  . H/O: depression 04/09/2015  . Family history of coronary arteriosclerosis 04/09/2015  . Benign hypertension 04/09/2015  . Depression 04/09/2015    Past Medical History  Diagnosis  Date  . Depression     Social History   Social History  . Marital Status: Single    Spouse Name: single  . Number of Children: 2  . Years of Education: 19   Occupational History  . employeed with [place] [employer]    Social History Main Topics  . Smoking status: Never Smoker   . Smokeless tobacco: Never Used  . Alcohol Use: Yes     Comment: wine ususally on the weekend  . Drug Use: No  . Sexual Activity: Not on file   Other Topics Concern  . Not on file   Social History Narrative    No Known Allergies  Review of Systems  Constitutional: Negative.   Respiratory: Negative.   Cardiovascular: Negative.   Gastrointestinal: Negative.   Musculoskeletal: Negative.   Psychiatric/Behavioral: Negative.      There is no immunization history on file for this patient. Objective:  BP 142/88 mmHg  Pulse 92  Temp(Src) 98.3 F (36.8 C)  Resp 14  Wt 215 lb (97.523 kg)  Physical Exam  Constitutional: He is oriented to person, place, and time and well-developed, well-nourished, and in no distress.  HENT:  Head: Normocephalic and atraumatic.  Cardiovascular: Normal rate, regular rhythm, normal heart sounds and intact distal pulses.   No murmur heard. Pulmonary/Chest: Effort normal and breath sounds normal. No respiratory distress. He has no wheezes.  Musculoskeletal: Normal range of motion.  He exhibits no edema or tenderness.  Neurological: He is alert and oriented to person, place, and time.  Psychiatric: Mood, memory, affect and judgment normal.    Lab Results  Component Value Date   WBC 7.3 08/23/2015   HCT 42.8 08/23/2015   PLT 360 08/23/2015   GLUCOSE 99 08/23/2015   CHOL 227* 08/23/2015   TRIG 63 08/23/2015   HDL 61 08/23/2015   LDLCALC 153* 08/23/2015   TSH 1.400 08/23/2015    CMP     Component Value Date/Time   NA 140 08/23/2015 0956   K 4.4 08/23/2015 0956   CL 98 08/23/2015 0956   CO2 27 08/23/2015 0956   GLUCOSE 99 08/23/2015 0956    BUN 17 08/23/2015 0956   CREATININE 1.05 08/23/2015 0956   CREATININE 1.1 03/29/2015   CALCIUM 9.5 08/23/2015 0956   PROT 7.2 08/23/2015 0956   ALBUMIN 4.6 08/23/2015 0956   AST 19 08/23/2015 0956   ALT 22 08/23/2015 0956   ALKPHOS 70 08/23/2015 0956   BILITOT 0.7 08/23/2015 0956   GFRNONAA 81 08/23/2015 0956   GFRAA 93 08/23/2015 0956    Assessment and Plan :  1. Benign hypertension Slightly elevated. Will go ahead and increase Lisinopril to 20 mg daily. - lisinopril (PRINIVIL,ZESTRIL) 20 MG tablet; Take 1 tablet (20 mg total) by mouth daily.  Dispense: 90 tablet; Refill: 3  2. Gastro-esophageal reflux disease without esophagitis Stable on Omeprazole daily.  3. Depression Stable. 4. Hyperlipidemia Re check levels on the next visit.  5. Need for Tdap vaccination Administered today. Patient does not know where he had his last tetanus or exactly when it was. He did step on the nail about 2 months ago. Update today. - Tdap vaccine greater than or equal to 7yo IM I have done the exam and reviewed the above chart and it is accurate to the best of my knowledge.  Patient was seen and examined by Dr. Bosie Clos and note was scribed by Samara Deist, RMA.  Julieanne Manson MD [facility] 02/21/2016 8:33 AM

## 2016-06-02 ENCOUNTER — Ambulatory Visit (INDEPENDENT_AMBULATORY_CARE_PROVIDER_SITE_OTHER): Payer: 59 | Admitting: Family Medicine

## 2016-06-02 VITALS — BP 114/78 | HR 80 | Temp 98.3°F | Wt 212.0 lb

## 2016-06-02 DIAGNOSIS — R110 Nausea: Secondary | ICD-10-CM | POA: Diagnosis not present

## 2016-06-02 DIAGNOSIS — R197 Diarrhea, unspecified: Secondary | ICD-10-CM | POA: Diagnosis not present

## 2016-06-02 MED ORDER — PROMETHAZINE HCL 25 MG PO TABS: 25.0000 mg | ORAL_TABLET | Freq: Four times a day (QID) | ORAL | 0 refills | Status: DC | PRN

## 2016-06-02 MED ORDER — PROMETHAZINE HCL 25 MG PO TABS: 25.0000 mg | ORAL_TABLET | Freq: Once | ORAL | Status: DC

## 2016-06-02 MED ORDER — DIPHENOXYLATE-ATROPINE 2.5-0.025 MG PO TABS: 1.0000 | ORAL_TABLET | Freq: Four times a day (QID) | ORAL | Status: DC | PRN

## 2016-06-02 MED ORDER — DIPHENOXYLATE-ATROPINE 2.5-0.025 MG PO TABS: 1.0000 | ORAL_TABLET | Freq: Four times a day (QID) | ORAL | 0 refills | Status: DC | PRN

## 2016-06-02 MED ORDER — PROMETHAZINE HCL 12.5 MG PO TABS: 25.0000 mg | ORAL_TABLET | Freq: Four times a day (QID) | ORAL | Status: DC | PRN

## 2016-06-02 NOTE — Progress Notes (Signed)
James Arellano  MRN: [id number] DOB: May 28, 1961  Subjective:  HPI   The patient is 55 year old male who presents with complaint of nausea, diarrhea and fever.  He was out of the country for 2 weeks, from July 15-July 28.  He traveled to [place] and [place].  On Monday July 24 he developed fever, nausea and diarrhea.  He continued with fever until Thursday July 27, and the diarrhea and nausea continue today.  He began feeling better on the 27 but on the 28 he had to travel home and has worsened since the day of travel.  No other people he was around during this time developed any symptoms.  Patient Active Problem List   Diagnosis Date Noted  . Gastro-esophageal reflux disease without esophagitis 06/15/2015  . Personal history of other diseases of the digestive system 06/15/2015  . Barrett esophagus 04/09/2015  . H/O: depression 04/09/2015  . Family history of coronary arteriosclerosis 04/09/2015  . Benign hypertension 04/09/2015  . Depression 04/09/2015    Past Medical History:  Diagnosis Date  . Depression     Social History   Social History  . Marital status: Single    Spouse name: single  . Number of children: 2  . Years of education: 42   Occupational History  . employeed with [place] [employer]    Social History Main Topics  . Smoking status: Never Smoker  . Smokeless tobacco: Never Used  . Alcohol use Yes     Comment: wine ususally on the weekend  . Drug use: No  . Sexual activity: Not on file   Other Topics Concern  . Not on file   Social History Narrative  . No narrative on file    Outpatient Medications Prior to Visit  Medication Sig Dispense Refill  . ADDERALL XR 20 MG 24 hr capsule TK ONE C PO BID  0  . citalopram (CELEXA) 20 MG tablet Take 20 mg by mouth daily.     Marland Kitchen lisinopril (PRINIVIL,ZESTRIL) 20 MG tablet Take 1 tablet (20 mg total) by mouth daily. 90 tablet 3  . Multiple Vitamin (MULTIVITAMIN) tablet Take 1 tablet by mouth daily.     Marland Kitchen omeprazole (PRILOSEC) 40 MG capsule Take 40 mg by mouth 2 (two) times daily.      No facility-administered medications prior to visit.     No Known Allergies  Review of Systems  Constitutional: Positive for chills, fever and malaise/fatigue.  HENT: Positive for sore throat. Negative for congestion, ear discharge, ear pain, hearing loss, nosebleeds and tinnitus.   Respiratory: Positive for cough and sputum production. Negative for hemoptysis, shortness of breath, wheezing and stridor.   Cardiovascular: Negative for chest pain, palpitations, orthopnea, claudication, leg swelling and PND.  Gastrointestinal: Positive for abdominal pain, diarrhea and nausea. Negative for blood in stool, constipation, heartburn, melena and vomiting.  Skin: Positive for itching. Negative for rash.  Neurological: Positive for dizziness, weakness and headaches.   Objective:  BP 114/78   Pulse 80   Temp 98.3 F (36.8 C) (Oral)   Wt 212 lb (96.2 kg)   BMI 28.75 kg/m   Physical Exam  Constitutional: He is oriented to person, place, and time and well-developed, well-nourished, and in no distress.  HENT:  Head: Normocephalic and atraumatic.  Eyes: Pupils are equal, round, and reactive to light.  Neck: Normal range of motion.  Cardiovascular: Normal rate, regular rhythm and normal heart sounds.   Pulmonary/Chest: Effort normal and breath sounds normal.  Abdominal: Soft. Bowel sounds are normal.  Neurological: He is alert and oriented to person, place, and time. Gait normal.  Skin: Skin is warm and dry.  Psychiatric: Mood, memory, affect and judgment normal.    Assessment and Plan :   1. Diarrhea, unspecified type  Unlikely traveler's diarrhea after traveling to [place]. More likely viral. Will workup as indicated. - diphenoxylate-atropine (LOMOTIL) 2.5-0.025 MG per tablet 1 tablet; Take 1 tablet by mouth 4 (four) times daily as needed for diarrhea or loose stools. - CBC with Differential/Platelet -  Comprehensive metabolic panel   2. Nausea Likely viral. No further workup beyond labs at this time.  - promethazine (PHENERGAN) tablet 25 mg; Take 2 tablets (25 mg total) by mouth every 6 (six) hours as needed for nausea or vomiting.    Julieanne Manson MD [facility] 06/02/2016 1:50 PM

## 2016-06-03 ENCOUNTER — Encounter: Payer: Self-pay | Admitting: Family Medicine

## 2016-06-03 LAB — COMPREHENSIVE METABOLIC PANEL
A/G RATIO: 1.6 (ref 1.2–2.2)
ALK PHOS: 60 IU/L (ref 39–117)
ALT: 12 IU/L (ref 0–44)
AST: 10 IU/L (ref 0–40)
Albumin: 4.1 g/dL (ref 3.5–5.5)
BILIRUBIN TOTAL: 0.4 mg/dL (ref 0.0–1.2)
BUN/Creatinine Ratio: 13 (ref 9–20)
BUN: 13 mg/dL (ref 6–24)
CHLORIDE: 96 mmol/L (ref 96–106)
CO2: 29 mmol/L (ref 18–29)
Calcium: 9.1 mg/dL (ref 8.7–10.2)
Creatinine, Ser: 1.02 mg/dL (ref 0.76–1.27)
GFR calc non Af Amer: 83 mL/min/{1.73_m2} (ref >59)
GFR, EST AFRICAN AMERICAN: 96 mL/min/{1.73_m2} (ref >59)
GLUCOSE: 94 mg/dL (ref 65–99)
Globulin, Total: 2.6 g/dL (ref 1.5–4.5)
POTASSIUM: 4.3 mmol/L (ref 3.5–5.2)
Sodium: 142 mmol/L (ref 134–144)
TOTAL PROTEIN: 6.7 g/dL (ref 6.0–8.5)

## 2016-06-03 LAB — CBC WITH DIFFERENTIAL/PLATELET
BASOS ABS: 0.1 10*3/uL (ref 0.0–0.2)
Basos: 1 %
EOS (ABSOLUTE): 0.2 10*3/uL (ref 0.0–0.4)
Eos: 2 %
Hematocrit: 40.2 % (ref 37.5–51.0)
Hemoglobin: 13.3 g/dL (ref 12.6–17.7)
Immature Grans (Abs): 0.1 10*3/uL (ref 0.0–0.1)
Immature Granulocytes: 1 %
LYMPHS ABS: 2 10*3/uL (ref 0.7–3.1)
Lymphs: 28 %
MCH: 30.5 pg (ref 26.6–33.0)
MCHC: 33.1 g/dL (ref 31.5–35.7)
MCV: 92 fL (ref 79–97)
MONOCYTES: 12 %
MONOS ABS: 0.9 10*3/uL (ref 0.1–0.9)
NEUTROS ABS: 4 10*3/uL (ref 1.4–7.0)
Neutrophils: 56 %
PLATELETS: 361 10*3/uL (ref 150–379)
RBC: 4.36 x10E6/uL (ref 4.14–5.80)
RDW: 13.8 % (ref 12.3–15.4)
WBC: 7.1 10*3/uL (ref 3.4–10.8)

## 2016-06-05 ENCOUNTER — Telehealth: Payer: Self-pay

## 2016-06-05 LAB — HEPATITIS A ANTIBODY, IGM: Hep A IgM: NEGATIVE

## 2016-06-05 LAB — SPECIMEN STATUS REPORT

## 2016-06-05 NOTE — Telephone Encounter (Signed)
Dr. Reece Agar.    I wanted to update you on the latest. Wednesday night the diarrhea returned with a vengeance, with 6 or so painfully productive movements in a 2 hr period. Since that time I have had several more, but none as prolific. The low grade fever has returned, as well as the chills, muscle aches, and general misery. I guess my question is, do I just need to suck it up and it will eventually go away, or is there anything else that can be done? I am not averse to spending a night or two in the hospital, if necessary. I just want this crap to go away.     As always, you are awesome!   This came through my chart-aa

## 2016-06-09 NOTE — Telephone Encounter (Signed)
Does pt have well or city water? If still having diarrhea can treat empirically with Cipro or can do stool studies to the lab.

## 2016-06-09 NOTE — Telephone Encounter (Signed)
Pt has improved over the weekend and just wants to wait on an antibiotic at this point. He does have a follow up appt for 06/12/16 with you and will keep it to follow up with this as well as follow up on medications. Thanks.

## 2016-06-12 ENCOUNTER — Ambulatory Visit (INDEPENDENT_AMBULATORY_CARE_PROVIDER_SITE_OTHER): Payer: 59 | Admitting: Family Medicine

## 2016-06-12 ENCOUNTER — Encounter: Payer: Self-pay | Admitting: Family Medicine

## 2016-06-12 VITALS — BP 122/72 | HR 80 | Temp 98.3°F | Resp 16 | Wt 216.0 lb

## 2016-06-12 DIAGNOSIS — K219 Gastro-esophageal reflux disease without esophagitis: Secondary | ICD-10-CM

## 2016-06-12 DIAGNOSIS — I10 Essential (primary) hypertension: Secondary | ICD-10-CM | POA: Diagnosis not present

## 2016-06-12 NOTE — Progress Notes (Signed)
Patient: James Arellano Male    DOB: 07/15/1961   55 y.o.   MRN: [id number] Visit Date: 06/12/2016  Today's Provider: Megan Mansichard Rishik Tubby Jr, MD   Chief Complaint  Patient presents with  . Hypertension    3 month follow up   Subjective:    HPI  Hypertension, follow-up:  BP Readings from Last 3 Encounters:  06/12/16 122/72  06/02/16 114/78  02/21/16 (!) 142/88    He was last seen for hypertension 4 months ago.  BP at that visit was 142/88. Management since that visit includes increasing Lisinopril to 20mg  daily. He reports good compliance with treatment. He is not having side effects.  He is exercising. He is adherent to low salt diet.   Outside blood pressures are checked occasionally. Patient denies chest pain, fatigue, orthopnea, palpitations and tachypnea.    Weight trend: stable Wt Readings from Last 3 Encounters:  06/12/16 216 lb (98 kg)  06/02/16 212 lb (96.2 kg)  02/21/16 215 lb (97.5 kg)    Current diet: well balanced   Patient also reports that he was seen last week for diarrhea. Patient reports that his symptoms have resolved.      No Known Allergies Current Meds  Medication Sig  . ADDERALL XR 20 MG 24 hr capsule TK ONE C PO BID  . citalopram (CELEXA) 20 MG tablet Take 20 mg by mouth daily.   . diphenoxylate-atropine (LOMOTIL) 2.5-0.025 MG tablet Take 1 tablet by mouth 4 (four) times daily as needed for diarrhea or loose stools.  Marland Kitchen. lisinopril (PRINIVIL,ZESTRIL) 20 MG tablet Take 1 tablet (20 mg total) by mouth daily.  . Multiple Vitamin (MULTIVITAMIN) tablet Take 1 tablet by mouth daily.  Marland Kitchen. omeprazole (PRILOSEC) 40 MG capsule Take 40 mg by mouth 2 (two) times daily.   . promethazine (PHENERGAN) 25 MG tablet Take 1 tablet (25 mg total) by mouth every 6 (six) hours as needed for nausea or vomiting.   Current Facility-Administered Medications for the 06/12/16 encounter (Office Visit) with Maple Hudsonichard L Anelle Parlow Jr., MD  Medication  .  diphenoxylate-atropine (LOMOTIL) 2.5-0.025 MG per tablet 1 tablet  . diphenoxylate-atropine (LOMOTIL) 2.5-0.025 MG per tablet 1 tablet  . promethazine (PHENERGAN) tablet 25 mg  . promethazine (PHENERGAN) tablet 25 mg    Review of Systems  Constitutional: Negative.   Respiratory: Negative.   Cardiovascular: Negative.   Musculoskeletal: Negative.   Neurological: Negative.     Social History  Substance Use Topics  . Smoking status: Never Smoker  . Smokeless tobacco: Never Used  . Alcohol use Yes     Comment: wine ususally on the weekend   Objective:   BP 122/72 (BP Location: Right Arm, Patient Position: Sitting, Cuff Size: Normal)   Pulse 80   Temp 98.3 F (36.8 C)   Resp 16   Wt 216 lb (98 kg)   BMI 29.29 kg/m   Physical Exam  Constitutional: He is oriented to person, place, and time. He appears well-developed and well-nourished.  HENT:  Head: Normocephalic and atraumatic.  Eyes: Conjunctivae are normal.  Cardiovascular: Normal rate, regular rhythm, normal heart sounds and intact distal pulses.   Pulmonary/Chest: Effort normal and breath sounds normal.  Abdominal: Soft. Bowel sounds are normal.  Neurological: He is alert and oriented to person, place, and time.  Skin: Skin is warm and dry.  Psychiatric: He has a normal mood and affect. His behavior is normal. Judgment and thought content normal.  Assessment & Plan:     1. Benign hypertension   2. Gastro-esophageal reflux disease without esophagitis  3. Gastroenteritis Resolving       Megan Mans, MD  [facility]

## 2016-06-16 ENCOUNTER — Encounter: Payer: Self-pay | Admitting: Family Medicine

## 2016-06-23 ENCOUNTER — Other Ambulatory Visit: Payer: Self-pay | Admitting: Family Medicine

## 2016-06-23 ENCOUNTER — Telehealth: Payer: Self-pay | Admitting: Family Medicine

## 2016-06-23 DIAGNOSIS — R197 Diarrhea, unspecified: Secondary | ICD-10-CM

## 2016-06-23 NOTE — Telephone Encounter (Signed)
Patient advised as below. Lab slip printed.

## 2016-06-23 NOTE — Telephone Encounter (Signed)
GI referraL while waiting obtain stool for CDiff,WBC,O and P.

## 2016-06-23 NOTE — Telephone Encounter (Signed)
Pt states he has had diarrhea on and off for 4 weeks.  Pt states he has been in to see Dr Sullivan LoneGilbert a couple of times.  Pt is concerned due to the diarrhea started back last night.  Pt is requesting a call back.  CB#[phone]/MW

## 2016-06-24 ENCOUNTER — Encounter: Payer: Self-pay | Admitting: Family Medicine

## 2016-07-02 LAB — CDIFF NAA+O+P+STOOL CULTURE: E coli, Shiga toxin Assay: NEGATIVE

## 2017-01-10 ENCOUNTER — Other Ambulatory Visit: Payer: Self-pay | Admitting: Family Medicine

## 2017-01-10 DIAGNOSIS — I10 Essential (primary) hypertension: Secondary | ICD-10-CM

## 2017-06-26 ENCOUNTER — Other Ambulatory Visit: Payer: Self-pay | Admitting: Family Medicine

## 2017-06-26 DIAGNOSIS — I10 Essential (primary) hypertension: Secondary | ICD-10-CM

## 2017-09-03 DIAGNOSIS — Z8719 Personal history of other diseases of the digestive system: Secondary | ICD-10-CM | POA: Diagnosis not present

## 2017-09-03 DIAGNOSIS — K2270 Barrett's esophagus without dysplasia: Secondary | ICD-10-CM | POA: Diagnosis not present

## 2017-09-03 DIAGNOSIS — K219 Gastro-esophageal reflux disease without esophagitis: Secondary | ICD-10-CM | POA: Diagnosis not present

## 2017-09-17 ENCOUNTER — Other Ambulatory Visit: Payer: Self-pay | Admitting: Family Medicine

## 2017-09-17 DIAGNOSIS — I10 Essential (primary) hypertension: Secondary | ICD-10-CM

## 2017-10-01 DIAGNOSIS — K219 Gastro-esophageal reflux disease without esophagitis: Secondary | ICD-10-CM | POA: Diagnosis not present

## 2017-10-01 DIAGNOSIS — K2970 Gastritis, unspecified, without bleeding: Secondary | ICD-10-CM | POA: Diagnosis not present

## 2017-10-01 DIAGNOSIS — K2270 Barrett's esophagus without dysplasia: Secondary | ICD-10-CM | POA: Diagnosis not present

## 2017-10-20 ENCOUNTER — Other Ambulatory Visit: Payer: Self-pay | Admitting: Family Medicine

## 2017-10-20 DIAGNOSIS — I10 Essential (primary) hypertension: Secondary | ICD-10-CM

## 2018-02-01 ENCOUNTER — Other Ambulatory Visit: Payer: Self-pay | Admitting: Family Medicine

## 2018-02-01 DIAGNOSIS — I10 Essential (primary) hypertension: Secondary | ICD-10-CM

## 2018-02-23 ENCOUNTER — Ambulatory Visit: Payer: 59 | Admitting: Family Medicine

## 2018-02-23 ENCOUNTER — Encounter: Payer: Self-pay | Admitting: Family Medicine

## 2018-02-23 VITALS — BP 144/104 | HR 87 | Temp 98.6°F | Resp 16 | Wt 224.2 lb

## 2018-02-23 DIAGNOSIS — F988 Other specified behavioral and emotional disorders with onset usually occurring in childhood and adolescence: Secondary | ICD-10-CM | POA: Diagnosis not present

## 2018-02-23 DIAGNOSIS — I10 Essential (primary) hypertension: Secondary | ICD-10-CM | POA: Diagnosis not present

## 2018-02-23 MED ORDER — LISINOPRIL 40 MG PO TABS: 40.0000 mg | ORAL_TABLET | Freq: Every day | ORAL | 3 refills | Status: DC

## 2018-02-23 NOTE — Progress Notes (Signed)
Patient: James Arellano Male    DOB: 1961/03/23   57 y.o.   MRN: [id number] Visit Date: 02/23/2018  Today's Provider: Megan Mansichard Gilbert Jr, MD   Chief Complaint  Patient presents with  . Hypertension  . Gastroesophageal Reflux   Subjective:    Gastroesophageal Reflux  He complains of heartburn. He reports no abdominal pain, no belching, no chest pain, no choking, no coughing, no dysphagia, no early satiety, no globus sensation, no hoarse voice or no nausea. This is a recurrent problem. The problem occurs occasionally. The problem has been unchanged. The heartburn is located in the substernum. The heartburn is of mild intensity. The heartburn does not wake him from sleep. The heartburn does not limit his activity. The heartburn doesn't change with position. The symptoms are aggravated by certain foods. Pertinent negatives include no anemia, fatigue, melena, muscle weakness, orthopnea or weight loss. Risk factors include Barrett's esophagus and caffeine use. He has tried an antacid for the symptoms.        Hypertension, follow-up:  BP Readings from Last 3 Encounters:  02/23/18 (!) 144/104  06/12/16 122/72  06/02/16 114/78    He was last seen for hypertension 1 years ago.  BP at that visit was 122/72. Management changes since that visit include none. He reports fair compliance with treatment. He is not having side effects.  He is exercising. He is adherent to low salt diet.   Outside blood pressures are as follows per patient diastolic reading ranging >90. He is experiencing none.  Patient denies chest pain, chest pressure/discomfort, claudication, dyspnea, exertional chest pressure/discomfort, fatigue, irregular heart beat, lower extremity edema, near-syncope, orthopnea, palpitations, paroxysmal nocturnal dyspnea, syncope and tachypnea.   Cardiovascular risk factors include advanced age (older than 8855 for men, 5165 for women).  Use of agents associated with hypertension:  none.     Weight trend: increasing steadily Wt Readings from Last 3 Encounters:  02/23/18 224 lb 3.2 oz (101.7 kg)  06/12/16 216 lb (98 kg)  06/02/16 212 lb (96.2 kg)    Current diet: in general, a "healthy" diet    ------------------------------------------------------------------------    No Known Allergies   Current Outpatient Medications:  .  ADDERALL XR 20 MG 24 hr capsule, TK ONE C PO BID, Disp: , Rfl: 0 .  busPIRone (BUSPAR) 10 MG tablet, Take by mouth., Disp: , Rfl:  .  citalopram (CELEXA) 20 MG tablet, Take 20 mg by mouth daily. , Disp: , Rfl:  .  lisinopril (PRINIVIL,ZESTRIL) 20 MG tablet, TAKE 1 TABLET BY MOUTH  DAILY, Disp: 30 tablet, Rfl: 0 .  omeprazole (PRILOSEC) 40 MG capsule, Take 40 mg by mouth 2 (two) times daily. , Disp: , Rfl:   Current Facility-Administered Medications:  .  diphenoxylate-atropine (LOMOTIL) 2.5-0.025 MG per tablet 1 tablet, 1 tablet, Oral, QID PRN, Maple HudsonGilbert, Richard L Jr., MD .  diphenoxylate-atropine (LOMOTIL) 2.5-0.025 MG per tablet 1 tablet, 1 tablet, Oral, QID PRN, Maple HudsonGilbert, Richard L Jr., MD .  promethazine [facility](PHENERGAN) tablet 25 mg, 25 mg, Oral, Once, Maple HudsonGilbert, Richard L Jr., MD .  promethazine [facility](PHENERGAN) tablet 25 mg, 25 mg, Oral, Q6H PRN, Maple HudsonGilbert, Richard L Jr., MD  Review of Systems  Constitutional: Negative for fatigue and weight loss.  HENT: Negative.  Negative for hoarse voice.   Respiratory: Negative for cough and choking.   Cardiovascular: Negative for chest pain.  Gastrointestinal: Positive for heartburn. Negative for abdominal pain, dysphagia, melena and nausea.  Genitourinary: Negative.  Musculoskeletal: Negative.  Negative for muscle weakness.  Allergic/Immunologic: Negative.   Neurological: Negative.   Hematological: Negative.   Psychiatric/Behavioral: Negative.     Social History   Tobacco Use  . Smoking status: Never Smoker  . Smokeless tobacco: Never Used  Substance Use Topics  . Alcohol use: Yes    Comment:  wine ususally on the weekend   Objective:   BP (!) 144/104   Pulse 87   Temp 98.6 F (37 C) (Oral)   Resp 16   Wt 224 lb 3.2 oz (101.7 kg)   BMI 30.41 kg/m  Vitals:   02/23/18 1502  BP: (!) 144/104  Pulse: 87  Resp: 16  Temp: 98.6 F (37 C)  TempSrc: Oral  Weight: 224 lb 3.2 oz (101.7 kg)     Physical Exam  Constitutional: He is oriented to person, place, and time. He appears well-developed and well-nourished.  HENT:  Head: Normocephalic and atraumatic.  Eyes: No scleral icterus.  Neck: No thyromegaly present.  Cardiovascular: Normal rate, regular rhythm and normal heart sounds.  Pulmonary/Chest: Effort normal.  Abdominal: Soft.  Neurological: He is alert and oriented to person, place, and time.  Skin: Skin is warm and dry.  Psychiatric: He has a normal mood and affect. His behavior is normal. Judgment and thought content normal.        Assessment & Plan:     1. Benign hypertension 2.ADD Per psychiatry. - lisinopril (PRINIVIL,ZESTRIL) 40 MG tablet; Take 1 tablet (40 mg total) by mouth daily.  Dispense: 90 tablet; Refill: 3 RTC 6 months for CPE.     I have done the exam and reviewed the chart and it is accurate to the best of my knowledge. Dentist has been used and  any errors in dictation or transcription are unintentional. Julieanne Manson M.D. [facility]  Megan Mans, MD  [facility]

## 2018-04-06 ENCOUNTER — Ambulatory Visit: Payer: 59 | Admitting: Family Medicine

## 2018-04-06 ENCOUNTER — Encounter: Payer: Self-pay | Admitting: Family Medicine

## 2018-04-06 VITALS — BP 118/80 | HR 74 | Temp 99.4°F | Resp 14 | Ht 72.0 in | Wt 222.0 lb

## 2018-04-06 DIAGNOSIS — L309 Dermatitis, unspecified: Secondary | ICD-10-CM

## 2018-04-06 DIAGNOSIS — B880 Other acariasis: Secondary | ICD-10-CM | POA: Diagnosis not present

## 2018-04-06 MED ORDER — PREDNISONE 5 MG (48) PO TBPK: ORAL_TABLET | ORAL | 0 refills | Status: DC

## 2018-04-06 NOTE — Progress Notes (Signed)
Patient: James Arellano Male    DOB: 09-19-1961   57 y.o.   MRN: [id number] Visit Date: 04/06/2018  Today's Provider: Megan Mansichard Gilbert Jr, MD   Chief Complaint  Patient presents with  . Rash   Subjective:    HPI Pt is here today for a rash that has been going on for about 2-3 weeks. He reports that it is extremely itchy. It is red, raised and sort of scaly like. He was thinking it was some type of poison ivy/oak but it doe snot have the blisters that it normally does. PT reports that he does a lot of outside yard work but he uses gloves, long pants, long sleeves and does not know how he would be getting into anything. The rash is located on his lower back, lower legs and buttocks. He has been trying Benadryl cream and another OTC cream for it without relief.    No Known Allergies   Current Outpatient Medications:  .  ADDERALL XR 20 MG 24 hr capsule, TK ONE C PO BID, Disp: , Rfl: 0 .  busPIRone (BUSPAR) 10 MG tablet, Take by mouth., Disp: , Rfl:  .  citalopram (CELEXA) 20 MG tablet, Take 20 mg by mouth daily. , Disp: , Rfl:  .  lisinopril (PRINIVIL,ZESTRIL) 40 MG tablet, Take 1 tablet (40 mg total) by mouth daily., Disp: 90 tablet, Rfl: 3 .  omeprazole (PRILOSEC) 40 MG capsule, Take 40 mg by mouth 2 (two) times daily. , Disp: , Rfl:   Current Facility-Administered Medications:  .  diphenoxylate-atropine (LOMOTIL) 2.5-0.025 MG per tablet 1 tablet, 1 tablet, Oral, QID PRN, Maple HudsonGilbert, Richard L Jr., MD .  diphenoxylate-atropine (LOMOTIL) 2.5-0.025 MG per tablet 1 tablet, 1 tablet, Oral, QID PRN, Maple HudsonGilbert, Richard L Jr., MD .  promethazine [facility](PHENERGAN) tablet 25 mg, 25 mg, Oral, Once, Maple HudsonGilbert, Richard L Jr., MD .  promethazine [facility](PHENERGAN) tablet 25 mg, 25 mg, Oral, Q6H PRN, Maple HudsonGilbert, Richard L Jr., MD  Review of Systems  Constitutional: Negative.   HENT: Negative.   Eyes: Negative.   Respiratory: Negative.   Cardiovascular: Negative.   Gastrointestinal: Negative.   Endocrine:  Negative.   Genitourinary: Negative.   Musculoskeletal: Negative.   Skin: Positive for rash.  Allergic/Immunologic: Negative.   Neurological: Negative.   Hematological: Negative.   Psychiatric/Behavioral: Negative.     Social History   Tobacco Use  . Smoking status: Never Smoker  . Smokeless tobacco: Never Used  Substance Use Topics  . Alcohol use: Yes    Comment: wine ususally on the weekend   Objective:   BP 118/80 (BP Location: Left Arm, Patient Position: Sitting, Cuff Size: Large)   Pulse 74   Temp 99.4 F (37.4 C) (Oral)   Resp 14   Ht 6' (1.829 m)   Wt 222 lb (100.7 kg)   BMI 30.11 kg/m  Vitals:   04/06/18 1109  BP: 118/80  Pulse: 74  Resp: 14  Temp: 99.4 F (37.4 C)  TempSrc: Oral  Weight: 222 lb (100.7 kg)  Height: 6' (1.829 m)     Physical Exam  Constitutional: He is oriented to person, place, and time. He appears well-developed and well-nourished.  HENT:  Head: Normocephalic and atraumatic.  Right Ear: External ear normal.  Left Ear: External ear normal.  Nose: Nose normal.  Mouth/Throat: Oropharynx is clear and moist.  Eyes: No scleral icterus.  Neck: No thyromegaly present.  Cardiovascular: Normal rate, regular rhythm and normal heart sounds.  Pulmonary/Chest: Effort normal and breath sounds normal.  Lymphadenopathy:    He has no cervical adenopathy.  Neurological: He is alert and oriented to person, place, and time.  Skin: Skin is warm and dry. Rash noted.  Erythematous scaling rash on trunk. Small patches. No blisters.  Psychiatric: He has a normal mood and affect. His behavior is normal. Judgment and thought content normal.        Assessment & Plan:     1. [place]  - predniSONE (STERAPRED UNI-PAK 48 TAB) 5 MG (48) TBPK tablet; Take as directed on pak.  Dispense: 48 tablet; Refill: 0  2. Dermatitis Nonspecific. - predniSONE (STERAPRED UNI-PAK 48 TAB) 5 MG (48) TBPK tablet; Take as directed on pak.  Dispense: 48 tablet; Refill:  0  I have done the exam and reviewed the chart and it is accurate to the best of my knowledge. Dentist has been used and  any errors in dictation or transcription are unintentional. Julieanne Manson M.D. [facility]       Megan Mans, MD  [facility]

## 2018-04-22 ENCOUNTER — Ambulatory Visit: Payer: Self-pay | Admitting: Family Medicine

## 2018-10-07 ENCOUNTER — Encounter: Payer: Self-pay | Admitting: Family Medicine

## 2018-10-12 ENCOUNTER — Ambulatory Visit (INDEPENDENT_AMBULATORY_CARE_PROVIDER_SITE_OTHER): Payer: 59 | Admitting: Family Medicine

## 2018-10-12 ENCOUNTER — Other Ambulatory Visit: Payer: Self-pay

## 2018-10-12 VITALS — BP 162/88 | HR 68 | Temp 98.1°F | Resp 16 | Ht 72.0 in | Wt 234.0 lb

## 2018-10-12 DIAGNOSIS — Z23 Encounter for immunization: Secondary | ICD-10-CM

## 2018-10-12 DIAGNOSIS — Z8719 Personal history of other diseases of the digestive system: Secondary | ICD-10-CM | POA: Diagnosis not present

## 2018-10-12 DIAGNOSIS — G4733 Obstructive sleep apnea (adult) (pediatric): Secondary | ICD-10-CM | POA: Diagnosis not present

## 2018-10-12 DIAGNOSIS — Z125 Encounter for screening for malignant neoplasm of prostate: Secondary | ICD-10-CM

## 2018-10-12 DIAGNOSIS — Z0000 Encounter for general adult medical examination without abnormal findings: Secondary | ICD-10-CM | POA: Diagnosis not present

## 2018-10-12 DIAGNOSIS — I10 Essential (primary) hypertension: Secondary | ICD-10-CM | POA: Diagnosis not present

## 2018-10-12 LAB — POCT URINALYSIS DIPSTICK
BILIRUBIN UA: NEGATIVE
Blood, UA: NEGATIVE
Glucose, UA: NEGATIVE
Ketones, UA: NEGATIVE
Leukocytes, UA: NEGATIVE
Nitrite, UA: NEGATIVE
Protein, UA: NEGATIVE
Spec Grav, UA: >=1.03 — AB (ref 1.010–1.025)
Urobilinogen, UA: 0.2 E.U./dL
pH, UA: 6 (ref 5.0–8.0)

## 2018-10-12 MED ORDER — AMLODIPINE BESYLATE 5 MG PO TABS: 5.0000 mg | ORAL_TABLET | Freq: Every day | ORAL | 3 refills | Status: DC

## 2018-10-12 NOTE — Progress Notes (Signed)
Patient: James Arellano, Male    DOB: 1960-11-28, 57 y.o.   MRN: [id number] Visit Date: 10/12/2018  Today's Provider: Wilhemena Durie, MD   Chief Complaint  Patient presents with  . Annual Exam   Subjective:  James Arellano is a 57 y.o. male who presents today for health maintenance and complete physical. He feels well. He reports exercising three times weekly. He reports he is sleeping well. 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  He is married for the second time and has been for 8 years.  He has 2 children and one step child. His wife states he snores regularly and has some apnea.  He has not been exercising regularly and his diet is out of control.  Review of Systems  Constitutional: Negative.   HENT: Negative.   Eyes: Negative.   Respiratory: Negative.   Cardiovascular: Negative.   Gastrointestinal: Negative.   Endocrine: Negative.   Genitourinary: Negative.   Musculoskeletal: Negative.   Skin: Negative.   Allergic/Immunologic: Negative.   Neurological: Negative.   Hematological: Negative.   Psychiatric/Behavioral: Negative.     Social History   Socioeconomic History  . Marital status: Single    Spouse name: single  . Number of children: 2  . Years of education: 44  . Highest education level: Not on file  Occupational History  . 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 Financial resource strain: Not on file  . Food insecurity:    Worry: Not on file    Inability: Not on file  . Transportation needs:    Medical: Not on file    Non-medical: Not on file  Tobacco Use  . Smoking status: Never Smoker  . Smokeless tobacco: Never Used  Substance and Sexual Activity  . Alcohol use: Yes    Comment: wine ususally on the weekend  . Drug use: No  . Sexual activity: Not on file  Lifestyle  . Physical activity:    Days per week: Not on file    Minutes per session: Not on file  . Stress: Not on file   Relationships  . Social connections:    Talks on phone: Not on file    Gets together: Not on file    Attends religious service: Not on file    Active member of club or organization: Not on file    Attends meetings of clubs or organizations: Not on file    Relationship status: Not on file  . Intimate partner violence:    Fear of current or ex partner: Not on file    Emotionally abused: Not on file    Physically abused: Not on file    Forced sexual activity: Not on file  Other Topics Concern  . Not on file  Social History Narrative  . Not on file    Patient Active Problem List   Diagnosis Date Noted  . Gastro-esophageal reflux disease without esophagitis 06/15/2015  . Personal history of other diseases of the digestive system 06/15/2015  . History of Barrett's esophagus 06/15/2015  . Barrett esophagus 04/09/2015  . H/O: depression 04/09/2015  . Family history of coronary arteriosclerosis 04/09/2015  . Benign hypertension 04/09/2015  . Depression 04/09/2015    Past Surgical History:  Procedure Laterality Date  . ESOPHAGOGASTRODUODENOSCOPY ENDOSCOPY  2015    His family history includes Dementia in his mother; GER disease in his brother; Heart disease in his father; Heart disease (age of onset: 57's) in his  brother; Heart disease (age of onset: 15's) in his brother; Peptic Ulcer Disease in his brother.     Outpatient Encounter Medications as of 10/12/2018  Medication Sig Note  . ADDERALL XR 20 MG 24 hr capsule TK ONE C PO BID 02/21/2016: Received from: External Pharmacy  . busPIRone (BUSPAR) 10 MG tablet Take by mouth.   . citalopram (CELEXA) 20 MG tablet Take 20 mg by mouth daily.  04/09/2015: Received from: [facility]  . lisinopril (PRINIVIL,ZESTRIL) 40 MG tablet Take 1 tablet (40 mg total) by mouth daily.   Marland Kitchen omeprazole (PRILOSEC) 40 MG capsule Take 40 mg by mouth 2 (two) times daily.  04/09/2015: Received from: [facility]  .  [DISCONTINUED] predniSONE (STERAPRED UNI-PAK 48 TAB) 5 MG (48) TBPK tablet Take as directed on pak.    Facility-Administered Encounter Medications as of 10/12/2018  Medication  . diphenoxylate-atropine (LOMOTIL) 2.5-0.025 MG per tablet 1 tablet  . diphenoxylate-atropine (LOMOTIL) 2.5-0.025 MG per tablet 1 tablet  . promethazine (PHENERGAN) tablet 25 mg  . promethazine (PHENERGAN) tablet 25 mg    Patient Care Team: Jerrol Banana., MD as PCP - General (Family Medicine)      Objective:   Vitals:  Vitals:   10/12/18 1508  BP: (!) 162/88  Pulse: 68  Resp: 16  Temp: 98.1 F (36.7 C)  TempSrc: Oral  SpO2: 99%  Weight: 234 lb (106.1 kg)  Height: 6' (1.829 m)    Physical Exam  Constitutional: He is oriented to person, place, and time. He appears well-developed and well-nourished.  HENT:  Head: Normocephalic and atraumatic.  Right Ear: External ear normal.  Left Ear: External ear normal.  Nose: Nose normal.  Mouth/Throat: Oropharynx is clear and moist.  Eyes: Pupils are equal, round, and reactive to light. Conjunctivae and EOM are normal.  Neck: Normal range of motion. Neck supple.  Cardiovascular: Normal rate, regular rhythm, normal heart sounds and intact distal pulses.  Pulmonary/Chest: Effort normal and breath sounds normal.  Abdominal: Soft. Bowel sounds are normal.  Genitourinary: Rectum normal, prostate normal and penis normal.  Musculoskeletal: Normal range of motion.  Neurological: He is alert and oriented to person, place, and time.  Skin: Skin is warm and dry.  Psychiatric: He has a normal mood and affect. His behavior is normal. Judgment and thought content normal.   Fall Risk  10/12/2018  Falls in the past year? 0   Depression Screen PHQ 2/9 Scores 10/12/2018  PHQ - 2 Score 0  PHQ- 9 Score 0   Functional Status Survey: Is the patient deaf or have difficulty hearing?: No Does the patient have difficulty seeing, even when wearing glasses/contacts?:  No Does the patient have difficulty concentrating, remembering, or making decisions?: No Does the patient have difficulty walking or climbing stairs?: No Does the patient have difficulty dressing or bathing?: No Does the patient have difficulty doing errands alone such as visiting a doctor's office or shopping?: No  Current Exercise Habits: Home exercise routine, Frequency (Times/Week): 3      Office Visit from 10/12/2018 in [place]  AUDIT-C Score  2      Assessment & Plan:     Routine Health Maintenance and Physical Exam  Exercise Activities and Dietary recommendations Goals   None     Immunization History  Administered Date(s) Administered  . MMR 06/08/1995  . Td 03/19/1995  . Tdap 02/21/2016    Health Maintenance  Topic Date Due  . Hepatitis C Screening  23-Nov-1960  . HIV Screening  03/27/1977  . COLONOSCOPY  03/27/2012  . INFLUENZA VACCINE  06/03/2018  . TETANUS/TDAP  02/20/2026     Discussed health benefits of physical activity, and encouraged him to engage in regular exercise appropriate for his age and condition.  1. Annual physical exam  - CBC with Differential/Platelet - Comprehensive metabolic panel - Lipid Panel With LDL/HDL Ratio - TSH - POCT urinalysis dipstick  2. Prostate cancer screening  - PSA  3. Need for influenza vaccination  - Flu Vaccine QUAD 6+ mos PF IM (Fluarix Quad PF)  4. Essential hypertension Add amlodipine.  Return to clinic 1 to 2 months - amLODipine (NORVASC) 5 MG tablet; Take 1 tablet (5 mg total) by mouth daily.  Dispense: 90 tablet; Refill: 3  5. History of Barrett's esophagus May need to go back to see GI.  6. OSA (obstructive sleep apnea)/possible [place] today is 0.  Have the patient work hard on weight loss.      HPI, Exam and A&P Transcribed under the direction and in the presence of Miguel Aschoff, Brooke Bonito., MD. Electronically Signed: Althea Charon, RMA  I have done the exam and reviewed  the chart and it is accurate to the best of my knowledge. Development worker, community has been used and  any errors in dictation or transcription are unintentional. Miguel Aschoff M.D. [facility]

## 2018-12-13 ENCOUNTER — Ambulatory Visit: Payer: 59 | Admitting: Family Medicine

## 2018-12-13 ENCOUNTER — Encounter: Payer: Self-pay | Admitting: Family Medicine

## 2018-12-13 VITALS — BP 132/80 | HR 76 | Temp 98.7°F | Resp 16 | Ht 72.0 in | Wt 222.0 lb

## 2018-12-13 DIAGNOSIS — K2100 Gastro-esophageal reflux disease with esophagitis, without bleeding: Secondary | ICD-10-CM

## 2018-12-13 DIAGNOSIS — F3341 Major depressive disorder, recurrent, in partial remission: Secondary | ICD-10-CM

## 2018-12-13 DIAGNOSIS — I10 Essential (primary) hypertension: Secondary | ICD-10-CM | POA: Diagnosis not present

## 2018-12-13 NOTE — Progress Notes (Signed)
Patient: James BeardsStephen E Arellano Male    DOB: [birth year]   58 y.o.   MRN: [id number] Visit Date: 12/13/2018  Today's Provider: Megan Mansichard  Jr, MD   Chief Complaint  Patient presents with  . Hypertension   Subjective:   HPI  Hypertension, follow-up:  BP Readings from Last 3 Encounters:  12/13/18 132/80  10/12/18 (!) 162/88  04/06/18 118/80    He was last seen for hypertension 2 months ago.  BP at that visit was 162/88. Management since that visit includes adding amlodipine 5mg  daily. He reports good compliance with treatment. He is having side effects. Patient reports that he did notice that he is having more lightheaded when going from sitting to standing position. However, he reports that this only lasts a few seconds.    Outside blood pressures are checked daily. He is averaging 130s/80s.  He is experiencing none.  Patient denies exertional chest pressure/discomfort, lower extremity edema and palpitations.   Cardiovascular risk factors include dyslipidemia.   Weight trend: stable Wt Readings from Last 3 Encounters:  12/13/18 222 lb (100.7 kg)  10/12/18 234 lb (106.1 kg)  04/06/18 222 lb (100.7 kg)  Current diet: well balanced   No Known Allergies   Current Outpatient Medications:  .  ADDERALL XR 20 MG 24 hr capsule, TK ONE C PO BID, Disp: , Rfl: 0 .  amLODipine (NORVASC) 5 MG tablet, Take 1 tablet (5 mg total) by mouth daily., Disp: 90 tablet, Rfl: 3 .  busPIRone (BUSPAR) 10 MG tablet, Take by mouth., Disp: , Rfl:  .  citalopram (CELEXA) 20 MG tablet, Take 20 mg by mouth daily. , Disp: , Rfl:  .  lisinopril (PRINIVIL,ZESTRIL) 40 MG tablet, Take 1 tablet (40 mg total) by mouth daily., Disp: 90 tablet, Rfl: 3 .  omeprazole (PRILOSEC) 40 MG capsule, Take 40 mg by mouth 2 (two) times daily. , Disp: , Rfl:   Current Facility-Administered Medications:  .  diphenoxylate-atropine (LOMOTIL) 2.5-0.025 MG per tablet 1 tablet, 1 tablet, Oral, QID PRN, Maple Hudson,   L Jr., MD .  diphenoxylate-atropine (LOMOTIL) 2.5-0.025 MG per tablet 1 tablet, 1 tablet, Oral, QID PRN, Maple Hudson,  L Jr., MD .  promethazine [facility](PHENERGAN) tablet 25 mg, 25 mg, Oral, Once, Maple Hudson,  L Jr., MD .  promethazine [facility](PHENERGAN) tablet 25 mg, 25 mg, Oral, Q6H PRN, Maple Hudson,  L Jr., MD  Review of Systems  Constitutional: Negative for activity change, appetite change, chills, diaphoresis, fatigue, fever and unexpected weight change.  Eyes: Negative.   Respiratory: Negative for cough and shortness of breath.   Cardiovascular: Negative for chest pain, palpitations and leg swelling.  Endocrine: Negative.   Musculoskeletal: Negative for arthralgias, joint swelling, myalgias and neck pain.  Allergic/Immunologic: Negative.   Neurological: Positive for light-headedness. Negative for dizziness, weakness and headaches.  Hematological: Negative.   Psychiatric/Behavioral: Negative.     Social History   Tobacco Use  . Smoking status: Never Smoker  . Smokeless tobacco: Never Used  Substance Use Topics  . Alcohol use: Yes    Comment: wine ususally on the weekend      Objective:   BP 132/80 (BP Location: Left Arm, Patient Position: Sitting, Cuff Size: Large)   Pulse 76   Temp 98.7 F (37.1 C)   Resp 16   Ht 6' (1.829 m)   Wt 222 lb (100.7 kg)   BMI 30.11 kg/m  Vitals:   12/13/18 1457  BP: 132/80  Pulse: 76  Resp: 16  Temp: 98.7 F (37.1 C)  Weight: 222 lb (100.7 kg)  Height: 6' (1.829 m)     Physical Exam Constitutional:      Appearance: He is well-developed.  HENT:     Head: Normocephalic and atraumatic.     Right Ear: External ear normal.     Left Ear: External ear normal.     Nose: Nose normal.  Eyes:     Conjunctiva/sclera: Conjunctivae normal.     Pupils: Pupils are equal, round, and reactive to light.  Neck:     Musculoskeletal: Normal range of motion and neck supple.  Cardiovascular:     Rate and Rhythm: Normal rate and regular  rhythm.     Heart sounds: Normal heart sounds.  Pulmonary:     Effort: Pulmonary effort is normal.     Breath sounds: Normal breath sounds.  Abdominal:     General: Bowel sounds are normal.     Palpations: Abdomen is soft.  Genitourinary:    Penis: Normal.      Prostate: Normal.     Rectum: Normal.  Musculoskeletal: Normal range of motion.  Skin:    General: Skin is warm and dry.  Neurological:     Mental Status: He is alert and oriented to person, place, and time.  Psychiatric:        Behavior: Behavior normal.        Thought Content: Thought content normal.        Judgment: Judgment normal.         Assessment & Plan    Problem List Items Addressed This Visit      Cardiovascular and Mediastinum   Benign hypertension - Primary    Controlled with amlodipine and lisinopril.        Digestive   GERD with esophagitis    On omeprazole twice a day.  Followed by GI.      RESOLVED: Gastro-esophageal reflux disease without esophagitis    On omeprazole twice a day.  Followed by GI.        Other   Depression    Doing well on citalopram.  [place] in [place].       Return to clinic this summer.    I have done the exam and reviewed the above chart and it is accurate to the best of my knowledge. Dentist[employer]agon  technology has been used in this note in any air is in the dictation or transcription are unintentional.  Megan Mansichard  Jr, MD  [facility]

## 2018-12-15 DIAGNOSIS — K2100 Gastro-esophageal reflux disease with esophagitis, without bleeding: Secondary | ICD-10-CM | POA: Insufficient documentation

## 2018-12-15 NOTE — Assessment & Plan Note (Signed)
On omeprazole twice a day.  Followed by GI.

## 2018-12-15 NOTE — Assessment & Plan Note (Signed)
Controlled with amlodipine and lisinopril.

## 2018-12-15 NOTE — Assessment & Plan Note (Signed)
On omeprazole twice a day.  Followed by GI. 

## 2018-12-15 NOTE — Assessment & Plan Note (Signed)
Doing well on citalopram.  He is followed for this in [place].

## 2018-12-17 ENCOUNTER — Other Ambulatory Visit: Payer: Self-pay | Admitting: Family Medicine

## 2018-12-17 DIAGNOSIS — Z125 Encounter for screening for malignant neoplasm of prostate: Secondary | ICD-10-CM | POA: Diagnosis not present

## 2018-12-17 DIAGNOSIS — Z0000 Encounter for general adult medical examination without abnormal findings: Secondary | ICD-10-CM | POA: Diagnosis not present

## 2018-12-17 DIAGNOSIS — I10 Essential (primary) hypertension: Secondary | ICD-10-CM

## 2018-12-17 NOTE — Telephone Encounter (Signed)
[facility] faxed refill request for the following medications:  amLODipine (NORVASC) 5 MG tablet  Please advise.  

## 2018-12-18 LAB — CBC WITH DIFFERENTIAL/PLATELET
BASOS: 0 %
Basophils Absolute: 0 10*3/uL (ref 0.0–0.2)
EOS (ABSOLUTE): 0.1 10*3/uL (ref 0.0–0.4)
EOS: 2 %
HEMATOCRIT: 40.8 % (ref 37.5–51.0)
HEMOGLOBIN: 13.8 g/dL (ref 13.0–17.7)
IMMATURE GRANS (ABS): 0 10*3/uL (ref 0.0–0.1)
IMMATURE GRANULOCYTES: 0 %
LYMPHS: 23 %
Lymphocytes Absolute: 1.6 10*3/uL (ref 0.7–3.1)
MCH: 30.8 pg (ref 26.6–33.0)
MCHC: 33.8 g/dL (ref 31.5–35.7)
MCV: 91 fL (ref 79–97)
MONOCYTES: 6 %
Monocytes Absolute: 0.4 10*3/uL (ref 0.1–0.9)
NEUTROS ABS: 4.6 10*3/uL (ref 1.4–7.0)
Neutrophils: 69 %
Platelets: 301 10*3/uL (ref 150–450)
RBC: 4.48 x10E6/uL (ref 4.14–5.80)
RDW: 12.7 % (ref 11.6–15.4)
WBC: 6.7 10*3/uL (ref 3.4–10.8)

## 2018-12-18 LAB — COMPREHENSIVE METABOLIC PANEL
A/G RATIO: 2.1 (ref 1.2–2.2)
ALBUMIN: 4.6 g/dL (ref 3.8–4.9)
ALT: 19 IU/L (ref 0–44)
AST: 20 IU/L (ref 0–40)
Alkaline Phosphatase: 79 IU/L (ref 39–117)
BILIRUBIN TOTAL: 0.4 mg/dL (ref 0.0–1.2)
BUN / CREAT RATIO: 13 (ref 9–20)
BUN: 14 mg/dL (ref 6–24)
CALCIUM: 9.4 mg/dL (ref 8.7–10.2)
CHLORIDE: 99 mmol/L (ref 96–106)
CO2: 24 mmol/L (ref 20–29)
Creatinine, Ser: 1.11 mg/dL (ref 0.76–1.27)
GFR, EST AFRICAN AMERICAN: 85 mL/min/{1.73_m2} (ref >59)
GFR, EST NON AFRICAN AMERICAN: 74 mL/min/{1.73_m2} (ref >59)
Globulin, Total: 2.2 g/dL (ref 1.5–4.5)
Glucose: 98 mg/dL (ref 65–99)
POTASSIUM: 4.6 mmol/L (ref 3.5–5.2)
Sodium: 139 mmol/L (ref 134–144)
TOTAL PROTEIN: 6.8 g/dL (ref 6.0–8.5)

## 2018-12-18 LAB — LIPID PANEL WITH LDL/HDL RATIO
Cholesterol, Total: 208 mg/dL — ABNORMAL HIGH (ref 100–199)
HDL: 52 mg/dL (ref >39)
LDL Calculated: 144 mg/dL — ABNORMAL HIGH (ref 0–99)
LDL/HDL RATIO: 2.8 ratio (ref 0.0–3.6)
Triglycerides: 61 mg/dL (ref 0–149)
VLDL CHOLESTEROL CAL: 12 mg/dL (ref 5–40)

## 2018-12-18 LAB — PSA: Prostate Specific Ag, Serum: 0.6 ng/mL (ref 0.0–4.0)

## 2018-12-18 LAB — TSH: TSH: 1.53 u[IU]/mL (ref 0.450–4.500)

## 2018-12-20 NOTE — Telephone Encounter (Signed)
Please review. Thanks!  

## 2018-12-21 MED ORDER — AMLODIPINE BESYLATE 5 MG PO TABS: 5.0000 mg | ORAL_TABLET | Freq: Every day | ORAL | 3 refills | Status: DC

## 2019-02-19 ENCOUNTER — Encounter: Payer: Self-pay | Admitting: Family Medicine

## 2019-02-22 ENCOUNTER — Ambulatory Visit (INDEPENDENT_AMBULATORY_CARE_PROVIDER_SITE_OTHER): Payer: 59 | Admitting: Family Medicine

## 2019-02-22 ENCOUNTER — Other Ambulatory Visit: Payer: Self-pay

## 2019-02-22 DIAGNOSIS — B889 Infestation, unspecified: Secondary | ICD-10-CM

## 2019-02-22 MED ORDER — PERMETHRIN 5 % EX CREA: 1.0000 "application " | TOPICAL_CREAM | Freq: Once | CUTANEOUS | 0 refills | Status: AC

## 2019-02-22 NOTE — Progress Notes (Signed)
Established Patient Office Visit  Subjective:  Patient ID: James Arellano, male    DOB: 12/09/1960  Age: 58 y.o. MRN: [id number]  Virtual Visit via Video Note  I connected with [name] on 02/22/19 at  8:00 AM EDT by a video enabled telemedicine application and verified that I am speaking with the correct person using two identifiers.   I discussed the limitations of evaluation and management by telemedicine and the availability of in person appointments. The patient expressed understanding and agreed to proceed.  History of Present Illness: [place]  [place] has had chiggers at his home/ land in the past.  He mowed last week some high grass and feels that he has treat sugars again.  It is extremely pruritic on his back.   Observations/Objective: Rashes noted on the right mid back which is erythematous and does not appear to be secondarily infected.  Assessment and Plan: Chiggers.  With permethrin  Follow Up Instructions:    I discussed the assessment and treatment plan with the patient. The patient was provided an opportunity to ask questions and all were answered. The patient agreed with the plan and demonstrated an understanding of the instructions.   The patient was advised to call back or seek an in-person evaluation if the symptoms worsen or if the condition fails to improve as anticipated.  I provided 10 minutes of non-face-to-face time during this encounter.   Dawnyel Leven Wendelyn Breslow, MD   CC: No chief complaint on file. Itching rash.  HPI James Arellano presents for chiggers exposure and rash.  Past Medical History:  Diagnosis Date  . Depression     Past Surgical History:  Procedure Laterality Date  . ESOPHAGOGASTRODUODENOSCOPY ENDOSCOPY  2015    Family History  Problem Relation Age of Onset  . Dementia Mother   . Heart disease Father   . Heart disease Brother 30's  . Heart disease Brother 27's  . Peptic  Ulcer Disease Brother        removal of part if his esophagus.  . GER disease Brother     Social History   Socioeconomic History  . Marital status: Single    Spouse name: single  . Number of children: 2  . Years of education: 21  . Highest education level: Not on file  Occupational History  . 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 Financial resource strain: Not on file  . Food insecurity:    Worry: Not on file    Inability: Not on file  . Transportation needs:    Medical: Not on file    Non-medical: Not on file  Tobacco Use  . Smoking status: Never Smoker  . Smokeless tobacco: Never Used  Substance and Sexual Activity  . Alcohol use: Yes    Comment: wine ususally on the weekend  . Drug use: No  . Sexual activity: Not on file  Lifestyle  . Physical activity:    Days per week: Not on file    Minutes per session: Not on file  . Stress: Not on file  Relationships  . Social connections:    Talks on phone: Not on file    Gets together: Not on file    Attends religious service: Not on file    Active member of club or organization: Not on file    Attends meetings of clubs or organizations: Not on file    Relationship status:  Not on file  . Intimate partner violence:    Fear of current or ex partner: Not on file    Emotionally abused: Not on file    Physically abused: Not on file    Forced sexual activity: Not on file  Other Topics Concern  . Not on file  Social History Narrative  . Not on file    Outpatient Medications Prior to Visit  Medication Sig Dispense Refill  . ADDERALL XR 20 MG 24 hr capsule TK ONE C PO BID  0  . amLODipine (NORVASC) 5 MG tablet Take 1 tablet (5 mg total) by mouth daily. 90 tablet 3  . busPIRone (BUSPAR) 10 MG tablet Take by mouth.    . citalopram (CELEXA) 20 MG tablet Take 20 mg by mouth daily.     Marland Kitchen. lisinopril (PRINIVIL,ZESTRIL) 40 MG tablet Take 1 tablet (40 mg total) by mouth daily. 90 tablet 3  .  omeprazole (PRILOSEC) 40 MG capsule Take 40 mg by mouth 2 (two) times daily.      Facility-Administered Medications Prior to Visit  Medication Dose Route Frequency Provider Last Rate Last Dose  . diphenoxylate-atropine (LOMOTIL) 2.5-0.025 MG per tablet 1 tablet  1 tablet Oral QID PRN Maple HudsonGilbert, Daneil Beem L Jr., MD      . diphenoxylate-atropine (LOMOTIL) 2.5-0.025 MG per tablet 1 tablet  1 tablet Oral QID PRN Maple HudsonGilbert, Breyona Swander L Jr., MD      . promethazine [facility](PHENERGAN) tablet 25 mg  25 mg Oral Once Maple HudsonGilbert, [place] Retana L Jr., MD      . promethazine [facility](PHENERGAN) tablet 25 mg  25 mg Oral Q6H PRN Maple HudsonGilbert, Laiyla Slagel L Jr., MD        No Known Allergies  ROS Review of Systems  Constitutional: Negative.   Respiratory: Negative.   Cardiovascular: Negative.   Skin: Positive for rash.  Psychiatric/Behavioral: Negative.    Pruritus with rash on back.   Objective:    Physical Exam Large patch of erythema on the right mid back were patient is pruritic and has been scratching.  No secondary infection There were no vitals taken for this visit. Wt Readings from Last 3 Encounters:  12/13/18 222 lb (100.7 kg)  10/12/18 234 lb (106.1 kg)  04/06/18 222 lb (100.7 kg)     Health Maintenance Due  Topic Date Due  . Hepatitis C Screening  03[birth year]06[birth year]  . HIV Screening  03/27/1977  . COLONOSCOPY  03/27/2012    There are no preventive care reminders to display for this patient.  Lab Results  Component Value Date   TSH 1.530 12/17/2018   Lab Results  Component Value Date   WBC 6.7 12/17/2018   HGB 13.8 12/17/2018   HCT 40.8 12/17/2018   MCV 91 12/17/2018   PLT 301 12/17/2018   Lab Results  Component Value Date   NA 139 12/17/2018   K 4.6 12/17/2018   CO2 24 12/17/2018   GLUCOSE 98 12/17/2018   BUN 14 12/17/2018   CREATININE 1.11 12/17/2018   BILITOT 0.4 12/17/2018   ALKPHOS 79 12/17/2018   AST 20 12/17/2018   ALT 19 12/17/2018   PROT 6.8 12/17/2018   ALBUMIN 4.6 12/17/2018   CALCIUM 9.4  12/17/2018   Lab Results  Component Value Date   CHOL 208 (H) 12/17/2018   Lab Results  Component Value Date   HDL 52 12/17/2018   Lab Results  Component Value Date   LDLCALC 144 (H) 12/17/2018   Lab Results  Component Value Date  TRIG 61 12/17/2018   No results found for: CHOLHDL No results found for: XENM0H    Assessment & Plan:   Problem List Items Addressed This Visit    None    Chiggers Treat with permethrin No orders of the defined types were placed in this encounter.   Follow-up: No follow-ups on file.    Megan Mans, MD

## 2019-02-24 ENCOUNTER — Encounter: Payer: Self-pay | Admitting: Family Medicine

## 2019-04-08 ENCOUNTER — Other Ambulatory Visit: Payer: Self-pay | Admitting: Family Medicine

## 2019-04-08 DIAGNOSIS — I10 Essential (primary) hypertension: Secondary | ICD-10-CM

## 2019-04-08 NOTE — Telephone Encounter (Signed)
Please review

## 2019-06-13 ENCOUNTER — Other Ambulatory Visit: Payer: Self-pay

## 2019-06-13 ENCOUNTER — Ambulatory Visit (INDEPENDENT_AMBULATORY_CARE_PROVIDER_SITE_OTHER): Payer: 59 | Admitting: Adult Health

## 2019-06-13 ENCOUNTER — Encounter: Payer: Self-pay | Admitting: Adult Health

## 2019-06-13 VITALS — Wt 220.0 lb

## 2019-06-13 DIAGNOSIS — F902 Attention-deficit hyperactivity disorder, combined type: Secondary | ICD-10-CM | POA: Diagnosis not present

## 2019-06-13 DIAGNOSIS — I10 Essential (primary) hypertension: Secondary | ICD-10-CM

## 2019-06-13 DIAGNOSIS — F32A Depression, unspecified: Secondary | ICD-10-CM

## 2019-06-13 DIAGNOSIS — F411 Generalized anxiety disorder: Secondary | ICD-10-CM | POA: Diagnosis not present

## 2019-06-13 DIAGNOSIS — F329 Major depressive disorder, single episode, unspecified: Secondary | ICD-10-CM

## 2019-06-13 MED ORDER — BUSPIRONE HCL 10 MG PO TABS: 10.0000 mg | ORAL_TABLET | Freq: Three times a day (TID) | ORAL | 1 refills | Status: DC

## 2019-06-13 MED ORDER — ADDERALL XR 20 MG PO CP24: 20.0000 mg | ORAL_CAPSULE | Freq: Two times a day (BID) | ORAL | 0 refills | Status: DC

## 2019-06-13 MED ORDER — CITALOPRAM HYDROBROMIDE 20 MG PO TABS: 20.0000 mg | ORAL_TABLET | Freq: Every day | ORAL | 1 refills | Status: DC

## 2019-06-13 NOTE — Progress Notes (Signed)
James Arellano [id number] [birth year] 58 y.o.  Subjective:   Patient ID:  James Arellano is a 58 y.o. (DOB [birth year]) male.  Chief Complaint:  James Arellano Complaint  Patient presents with  . ADD  . Anxiety  . Depression    HPI James Arellano presents to the office today for follow-up of anxiety, depression, and ADHD.  Describes mood today as "ok". Mood symptoms - denies depression, anxiety, and irritability. Stating "I've been doing pretty good". Stable interest and motivation. 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 Both children are doing well. Taking medications as prescribed.  Energy levels stable. Active, has a regular exercise routine. Works in [employer]. Enjoys some usual interests and activities. Spending time with family. Getting out some. Appetite adequate. Weight stable. Sleeps well most nights. Averages 6 to 8 hours. Focus and concentration stable with medication. Completing tasks. Managing aspects of household. Work going well.  Denies SI or HI. Denies AH or VH.   Review of Systems:  Review of Systems  Musculoskeletal: Negative for gait problem.  Neurological: Negative for tremors.  Psychiatric/Behavioral:       Please refer to HPI    Medications: I have reviewed the patient's current medications.  Current Outpatient Medications  Medication Sig Dispense Refill  . [START ON 08/08/2019] ADDERALL XR 20 MG 24 hr capsule Take 1 capsule (20 mg total) by mouth 2 (two) times daily. 60 capsule 0  . amLODipine (NORVASC) 5 MG tablet Take 1 tablet (5 mg total) by mouth daily. 90 tablet 3  . busPIRone (BUSPAR) 10 MG tablet Take 1 tablet (10 mg total) by mouth 3 (three) times daily. 270 tablet 1  . citalopram (CELEXA) 20 MG tablet Take 1 tablet (20 mg total) by mouth daily. 90 tablet 1  . lisinopril (ZESTRIL) 40 MG tablet TAKE 1 TABLET BY MOUTH  DAILY 90 tablet 3  . omeprazole (PRILOSEC) 40 MG capsule Take 40 mg  by mouth 2 (two) times daily.      Current Facility-Administered Medications  Medication Dose Route Frequency Provider Last Rate Last Dose  . diphenoxylate-atropine (LOMOTIL) 2.5-0.025 MG per tablet 1 tablet  1 tablet Oral QID PRN Maple HudsonGilbert, Richard L Jr., MD      . diphenoxylate-atropine (LOMOTIL) 2.5-0.025 MG per tablet 1 tablet  1 tablet Oral QID PRN Maple HudsonGilbert, Richard L Jr., MD      . promethazine [facility](PHENERGAN) tablet 25 mg  25 mg Oral Once Maple HudsonGilbert, Richard L Jr., MD      . promethazine [facility](PHENERGAN) tablet 25 mg  25 mg Oral Q6H PRN Maple HudsonGilbert, Richard L Jr., MD        Medication Side Effects: None  Allergies: No Known Allergies  Past Medical History:  Diagnosis Date  . Depression     Family History  Problem Relation Age of Onset  . Dementia Mother   . Heart disease Father   . Heart disease Brother 30's  . Heart disease Brother 7650's  . Peptic Ulcer Disease Brother        removal of part if his esophagus.  . GER disease Brother     Social History   Socioeconomic History  . Marital status: Single    Spouse name: single  . Number of children: 2  . Years of education: 6119  . Highest education level: Not on file  Occupational History  . Occupation: employeed with [employer]  Social Needs  . Financial resource strain:  Not on file  . Food insecurity    Worry: Not on file    Inability: Not on file  . Transportation needs    Medical: Not on file    Non-medical: Not on file  Tobacco Use  . Smoking status: Never Smoker  . Smokeless tobacco: Never Used  Substance and Sexual Activity  . Alcohol use: Yes    Comment: wine ususally on the weekend  . Drug use: No  . Sexual activity: Not on file  Lifestyle  . Physical activity    Days per week: Not on file    Minutes per session: Not on file  . Stress: Not on file  Relationships  . Social Herbalist on phone: Not on file    Gets together: Not on file    Attends religious service: Not on file     Active member of club or organization: Not on file    Attends meetings of clubs or organizations: Not on file    Relationship status: Not on file  . Intimate partner violence    Fear of current or ex partner: Not on file    Emotionally abused: Not on file    Physically abused: Not on file    Forced sexual activity: Not on file  Other Topics Concern  . Not on file  Social History Narrative  . Not on file    Past Medical History, Surgical history, Social history, and Family history were reviewed and updated as appropriate.   Please see review of systems for further details on the patient's review from today.   Objective:   Physical Exam:  Wt 220 lb (99.8 kg)   BMI 29.84 kg/m   Physical Exam Neurological:     Mental Status: He is alert and oriented to person, place, and time.     Cranial Nerves: No dysarthria.  Psychiatric:        Attention and Perception: Attention normal.        Mood and Affect: Mood normal.        Speech: Speech normal.        Behavior: Behavior is cooperative.        Thought Content: Thought content normal. Thought content is not paranoid or delusional. Thought content does not include homicidal or suicidal ideation. Thought content does not include homicidal or suicidal plan.        Cognition and Memory: Cognition and memory normal.        Judgment: Judgment normal.     Lab Review:     Component Value Date/Time   NA 139 12/17/2018 0820   K 4.6 12/17/2018 0820   CL 99 12/17/2018 0820   CO2 24 12/17/2018 0820   GLUCOSE 98 12/17/2018 0820   BUN 14 12/17/2018 0820   CREATININE 1.11 12/17/2018 0820   CALCIUM 9.4 12/17/2018 0820   PROT 6.8 12/17/2018 0820   ALBUMIN 4.6 12/17/2018 0820   AST 20 12/17/2018 0820   ALT 19 12/17/2018 0820   ALKPHOS 79 12/17/2018 0820   BILITOT 0.4 12/17/2018 0820   GFRNONAA 74 12/17/2018 0820   GFRAA 85 12/17/2018 0820       Component Value Date/Time   WBC 6.7 12/17/2018 0820   RBC 4.48 12/17/2018 0820   HGB  13.8 12/17/2018 0820   HCT 40.8 12/17/2018 0820   PLT 301 12/17/2018 0820   MCV 91 12/17/2018 0820   MCH 30.8 12/17/2018 0820   MCHC 33.8 12/17/2018 0820   RDW  12.7 12/17/2018 0820   LYMPHSABS 1.6 12/17/2018 0820   EOSABS 0.1 12/17/2018 0820   BASOSABS 0.0 12/17/2018 0820    No results found for: POCLITH, LITHIUM   No results found for: PHENYTOIN, PHENOBARB, VALPROATE, CBMZ   .res Assessment: Plan:    Plan:  1. Continue Celexa 20mg  daily 2. Continue Buspar 10mg  TID 3. Continue Adderall XR20mg  capsule BID  Patient advised to contact office with any questions, adverse effects, or acute worsening in signs and symptoms.  RTC 4 weeks  James Arellano was seen today for add, anxiety and depression.  Diagnoses and all orders for this visit:  Generalized anxiety disorder -     busPIRone (BUSPAR) 10 MG tablet; Take 1 tablet (10 mg total) by mouth 3 (three) times daily. -     citalopram (CELEXA) 20 MG tablet; Take 1 tablet (20 mg total) by mouth daily.  Essential hypertension  Attention deficit hyperactivity disorder (ADHD), combined type -     Discontinue: ADDERALL XR 20 MG 24 hr capsule; Take 1 capsule (20 mg total) by mouth 2 (two) times daily. -     Discontinue: ADDERALL XR 20 MG 24 hr capsule; Take 1 capsule (20 mg total) by mouth 2 (two) times daily. -     Discontinue: ADDERALL XR 20 MG 24 hr capsule; Take 1 capsule (20 mg total) by mouth 2 (two) times daily. -     ADDERALL XR 20 MG 24 hr capsule; Take 1 capsule (20 mg total) by mouth 2 (two) times daily.  Depression, unspecified depression type -     citalopram (CELEXA) 20 MG tablet; Take 1 tablet (20 mg total) by mouth daily.     Please see After Visit Summary for patient specific instructions.  Future Appointments  Date Time Provider Department Center  09/13/2019  3:40 PM Kelson Queenan, Thereasa Soloegina Nattalie, NP [facility] None  10/17/2019  3:00 PM Maple HudsonGilbert, Richard L Jr., MD [facility] None    No orders of the defined types were placed  in this encounter.   -------------------------------

## 2019-06-20 ENCOUNTER — Encounter: Payer: Self-pay | Admitting: Adult Health

## 2019-09-06 ENCOUNTER — Telehealth: Payer: Self-pay

## 2019-09-06 NOTE — Telephone Encounter (Signed)
Prior authorization submitted and approved for ADDERALL XR 20 mg capsules 1 capsule twice daily #60 through [facility] effective through 09/05/2020  [facility] notified of approval

## 2019-09-12 ENCOUNTER — Other Ambulatory Visit: Payer: Self-pay | Admitting: Adult Health

## 2019-09-12 DIAGNOSIS — F32A Depression, unspecified: Secondary | ICD-10-CM

## 2019-09-12 DIAGNOSIS — F411 Generalized anxiety disorder: Secondary | ICD-10-CM

## 2019-09-12 DIAGNOSIS — F329 Major depressive disorder, single episode, unspecified: Secondary | ICD-10-CM

## 2019-09-13 ENCOUNTER — Other Ambulatory Visit: Payer: Self-pay

## 2019-09-13 ENCOUNTER — Ambulatory Visit (INDEPENDENT_AMBULATORY_CARE_PROVIDER_SITE_OTHER): Payer: 59 | Admitting: Adult Health

## 2019-09-13 ENCOUNTER — Encounter: Payer: Self-pay | Admitting: Adult Health

## 2019-09-13 DIAGNOSIS — F32A Depression, unspecified: Secondary | ICD-10-CM

## 2019-09-13 DIAGNOSIS — F329 Major depressive disorder, single episode, unspecified: Secondary | ICD-10-CM | POA: Diagnosis not present

## 2019-09-13 DIAGNOSIS — F902 Attention-deficit hyperactivity disorder, combined type: Secondary | ICD-10-CM | POA: Diagnosis not present

## 2019-09-13 DIAGNOSIS — F411 Generalized anxiety disorder: Secondary | ICD-10-CM | POA: Diagnosis not present

## 2019-09-13 MED ORDER — CITALOPRAM HYDROBROMIDE 20 MG PO TABS: 20.0000 mg | ORAL_TABLET | Freq: Every day | ORAL | 3 refills | Status: DC

## 2019-09-13 MED ORDER — AMPHETAMINE-DEXTROAMPHET ER 20 MG PO CP24: 20.0000 mg | ORAL_CAPSULE | Freq: Two times a day (BID) | ORAL | 0 refills | Status: DC

## 2019-09-13 MED ORDER — BUSPIRONE HCL 10 MG PO TABS: 10.0000 mg | ORAL_TABLET | Freq: Three times a day (TID) | ORAL | 3 refills | Status: DC

## 2019-09-13 NOTE — Progress Notes (Signed)
James Arellano [id number] 1961-08-01 58 y.o.  Subjective:   Patient ID:  James Arellano is a 58 y.o. (DOB 15-Jul-1961) male.  Chief Complaint:  No chief complaint on file.   HPI James Arellano presents to the office today for follow-up of anxiety, depression, and ADHD.  Describes mood today as "ok". Pleasant. Mood symptoms - denies depression, anxiety, and irritability. Stating "I'm doing alright, ready for 2020 to be over with". Stable interest and motivation. He and significant doing well. Taking medications as prescribed.  Energy levels stable. Active, has a regular exercise routine. Works in Consulting civil engineer for [employer]. Enjoys some usual interests and activities. Spending time with family - significant other - 2 adult children. One son at [place]. Daughter works as a Runner, broadcasting/film/video. Getting out some. Appetite adequate. Weight stable. Sleeps well most nights. Averages 6 to 8 hours. Focus and concentration stable with Adderall. Completing tasks. Managing aspects of household. Work going well.  Denies SI or HI. Denies AH or VH.  Review of Systems:  Review of Systems  Musculoskeletal: Negative for gait problem.  Neurological: Negative for tremors.  Psychiatric/Behavioral:       Please refer to HPI    Medications: I have reviewed the patient's current medications.  Current Outpatient Medications  Medication Sig Dispense Refill  . amLODipine (NORVASC) 5 MG tablet Take 1 tablet (5 mg total) by mouth daily. 90 tablet 3  . amphetamine-dextroamphetamine (ADDERALL XR) 20 MG 24 hr capsule Take 1 capsule (20 mg total) by mouth 2 (two) times daily. 60 capsule 0  . [START ON 10/11/2019] amphetamine-dextroamphetamine (ADDERALL XR) 20 MG 24 hr capsule Take 1 capsule (20 mg total) by mouth 2 (two) times daily. 60 capsule 0  . [START ON 11/08/2019] amphetamine-dextroamphetamine (ADDERALL XR) 20 MG 24 hr capsule Take 1 capsule (20 mg total) by mouth 2 (two) times daily. 60 capsule 0  .  busPIRone (BUSPAR) 10 MG tablet Take 1 tablet (10 mg total) by mouth 3 (three) times daily. 270 tablet 3  . citalopram (CELEXA) 20 MG tablet Take 1 tablet (20 mg total) by mouth daily. 90 tablet 3  . lisinopril (ZESTRIL) 40 MG tablet TAKE 1 TABLET BY MOUTH  DAILY 90 tablet 3  . omeprazole (PRILOSEC) 40 MG capsule Take 40 mg by mouth 2 (two) times daily.      Current Facility-Administered Medications  Medication Dose Route Frequency Provider Last Rate Last Dose  . diphenoxylate-atropine (LOMOTIL) 2.5-0.025 MG per tablet 1 tablet  1 tablet Oral QID PRN Maple Hudson., MD      . diphenoxylate-atropine (LOMOTIL) 2.5-0.025 MG per tablet 1 tablet  1 tablet Oral QID PRN Maple Hudson., MD      . promethazine [facility]) tablet 25 mg  25 mg Oral Once Maple Hudson., MD      . promethazine [facility]) tablet 25 mg  25 mg Oral Q6H PRN Maple Hudson., MD        Medication Side Effects: None  Allergies: No Known Allergies  Past Medical History:  Diagnosis Date  . Depression     Family History  Problem Relation Age of Onset  . Dementia Mother   . Heart disease Father   . Heart disease Brother 30's  . Heart disease Brother 62's  . Peptic Ulcer Disease Brother        removal of part if his esophagus.  . GER disease Brother     Social History   Socioeconomic  History  . Marital status: Single    Spouse name: single  . Number of children: 2  . Years of education: 67  . Highest education level: Not on file  Occupational History  . 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 Financial resource strain: Not on file  . Food insecurity    Worry: Not on file    Inability: Not on file  . Transportation needs    Medical: Not on file    Non-medical: Not on file  Tobacco Use  . Smoking status: Never Smoker  . Smokeless tobacco: Never Used  Substance and Sexual Activity  . Alcohol use: Yes    Comment: wine ususally on the weekend   . Drug use: No  . Sexual activity: Not on file  Lifestyle  . Physical activity    Days per week: Not on file    Minutes per session: Not on file  . Stress: Not on file  Relationships  . Social Herbalist on phone: Not on file    Gets together: Not on file    Attends religious service: Not on file    Active member of club or organization: Not on file    Attends meetings of clubs or organizations: Not on file    Relationship status: Not on file  . Intimate partner violence    Fear of current or ex partner: Not on file    Emotionally abused: Not on file    Physically abused: Not on file    Forced sexual activity: Not on file  Other Topics Concern  . Not on file  Social History Narrative  . Not on file    Past Medical History, Surgical history, Social history, and Family history were reviewed and updated as appropriate.   Please see review of systems for further details on the patient's review from today.   Objective:   Physical Exam:  There were no vitals taken for this visit.  Physical Exam Constitutional:      General: He is not in acute distress.    Appearance: He is well-developed.  Musculoskeletal:        General: No deformity.  Neurological:     Mental Status: He is alert and oriented to person, place, and time.     Cranial Nerves: No dysarthria.     Coordination: Coordination normal.  Psychiatric:        Attention and Perception: Attention and perception normal. He does not perceive auditory or visual hallucinations.        Mood and Affect: Mood normal. Mood is not anxious or depressed. Affect is not labile, blunt, angry or inappropriate.        Speech: Speech normal.        Behavior: Behavior normal. Behavior is cooperative.        Thought Content: Thought content normal. Thought content is not paranoid or delusional. Thought content does not include homicidal or suicidal ideation. Thought content does not include homicidal or suicidal plan.         Cognition and Memory: Cognition and memory normal.        Judgment: Judgment normal.     Comments: Insight intact     Lab Review:     Component Value Date/Time   NA 139 12/17/2018 0820   K 4.6 12/17/2018 0820   CL 99 12/17/2018 0820   CO2 24 12/17/2018 0820   GLUCOSE 98 12/17/2018 0820   BUN 14 12/17/2018  0820   CREATININE 1.11 12/17/2018 0820   CALCIUM 9.4 12/17/2018 0820   PROT 6.8 12/17/2018 0820   ALBUMIN 4.6 12/17/2018 0820   AST 20 12/17/2018 0820   ALT 19 12/17/2018 0820   ALKPHOS 79 12/17/2018 0820   BILITOT 0.4 12/17/2018 0820   GFRNONAA 74 12/17/2018 0820   GFRAA 85 12/17/2018 0820       Component Value Date/Time   WBC 6.7 12/17/2018 0820   RBC 4.48 12/17/2018 0820   HGB 13.8 12/17/2018 0820   HCT 40.8 12/17/2018 0820   PLT 301 12/17/2018 0820   MCV 91 12/17/2018 0820   MCH 30.8 12/17/2018 0820   MCHC 33.8 12/17/2018 0820   RDW 12.7 12/17/2018 0820   LYMPHSABS 1.6 12/17/2018 0820   EOSABS 0.1 12/17/2018 0820   BASOSABS 0.0 12/17/2018 0820    No results found for: POCLITH, LITHIUM   No results found for: PHENYTOIN, PHENOBARB, VALPROATE, CBMZ   .res Assessment: Plan:    Plan:  1. Continue Celexa 20mg  daily 2. Continue Buspar 10mg  TID 3. Continue Adderall XR20mg  capsule BID  Patient advised to contact office with any questions, adverse effects, or acute worsening in signs and symptoms.  RTC 3 months  Diagnoses and all orders for this visit:  Generalized anxiety disorder -     busPIRone (BUSPAR) 10 MG tablet; Take 1 tablet (10 mg total) by mouth 3 (three) times daily. -     citalopram (CELEXA) 20 MG tablet; Take 1 tablet (20 mg total) by mouth daily.  Depression, unspecified depression type -     citalopram (CELEXA) 20 MG tablet; Take 1 tablet (20 mg total) by mouth daily.  Attention deficit hyperactivity disorder (ADHD), combined type -     amphetamine-dextroamphetamine (ADDERALL XR) 20 MG 24 hr capsule; Take 1 capsule (20 mg total) by  mouth 2 (two) times daily. -     amphetamine-dextroamphetamine (ADDERALL XR) 20 MG 24 hr capsule; Take 1 capsule (20 mg total) by mouth 2 (two) times daily. -     amphetamine-dextroamphetamine (ADDERALL XR) 20 MG 24 hr capsule; Take 1 capsule (20 mg total) by mouth 2 (two) times daily.     Please see After Visit Summary for patient specific instructions.  Future Appointments  Date Time Provider Department Center  10/17/2019  3:00 PM Maple HudsonGilbert, Richard L Jr., MD [facility] None    No orders of the defined types were placed in this encounter.   -------------------------------

## 2019-10-10 ENCOUNTER — Encounter: Payer: Self-pay | Admitting: Family Medicine

## 2019-10-17 ENCOUNTER — Encounter: Payer: 59 | Admitting: Family Medicine

## 2019-10-24 ENCOUNTER — Other Ambulatory Visit: Payer: Self-pay | Admitting: Family Medicine

## 2019-10-24 DIAGNOSIS — I10 Essential (primary) hypertension: Secondary | ICD-10-CM

## 2019-12-14 ENCOUNTER — Encounter: Payer: Self-pay | Admitting: Adult Health

## 2019-12-14 ENCOUNTER — Ambulatory Visit (INDEPENDENT_AMBULATORY_CARE_PROVIDER_SITE_OTHER): Payer: 59 | Admitting: Adult Health

## 2019-12-14 ENCOUNTER — Other Ambulatory Visit: Payer: Self-pay

## 2019-12-14 DIAGNOSIS — F329 Major depressive disorder, single episode, unspecified: Secondary | ICD-10-CM

## 2019-12-14 DIAGNOSIS — F902 Attention-deficit hyperactivity disorder, combined type: Secondary | ICD-10-CM

## 2019-12-14 DIAGNOSIS — F411 Generalized anxiety disorder: Secondary | ICD-10-CM

## 2019-12-14 DIAGNOSIS — F32A Depression, unspecified: Secondary | ICD-10-CM

## 2019-12-14 MED ORDER — AMPHETAMINE-DEXTROAMPHET ER 20 MG PO CP24: 20.0000 mg | ORAL_CAPSULE | Freq: Two times a day (BID) | ORAL | 0 refills | Status: DC

## 2019-12-14 NOTE — Progress Notes (Signed)
ELYON ZOLL [id number] 03-17-1961 59 y.o.  Subjective:   Patient ID:  James Arellano is a 59 y.o. (DOB 11-Feb-1961) male.  Chief Complaint:  Chief Complaint  Patient presents with  . Anxiety  . Depression  . ADHD    HPI JASIYAH PAULDING presents to the office today for follow-up of anxiety, depression, and ADHD.  Describes mood today as "ok". Pleasant. Mood symptoms - denies depression, anxiety, and irritability. Stating "I'm doing pretty good". Concerns about "sexual" side effects. Wanting to change medications to see if it helps. Stable interest and motivation. He and significant other doing well. Taking medications as prescribed.  Energy levels stable. Active, has a regular exercise routine. 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 Enjoys some usual interests and activities. Divorced. Lives with significant other - has 2 adult children. One son at [place]. Daughter works as a Pharmacist, hospital. Woodworking - making a table. Appetite adequate. Weight stable. Sleeps well most nights. Averages 8 hours. Focus and concentration stable with Adderall. Completing tasks. Managing aspects of household. Work going well.  Denies SI or HI. Denies AH or VH.   PHQ2-9     Office Visit from 10/12/2018 in [place]  PHQ-2 Total Score  0  PHQ-9 Total Score  0       Review of Systems:  Review of Systems  Musculoskeletal: Negative for gait problem.  Neurological: Negative for tremors.  Psychiatric/Behavioral:       Please refer to HPI    Medications: I have reviewed the patient's current medications.  Current Outpatient Medications  Medication Sig Dispense Refill  . amLODipine (NORVASC) 5 MG tablet TAKE 1 TABLET BY MOUTH  DAILY 90 tablet 1  . amphetamine-dextroamphetamine (ADDERALL XR) 20 MG 24 hr capsule Take 1 capsule (20 mg total) by mouth 2 (two) times daily. 60 capsule 0  . [START ON 01/11/2020] amphetamine-dextroamphetamine (ADDERALL XR) 20 MG 24 hr  capsule Take 1 capsule (20 mg total) by mouth 2 (two) times daily. 60 capsule 0  . [START ON 02/08/2020] amphetamine-dextroamphetamine (ADDERALL XR) 20 MG 24 hr capsule Take 1 capsule (20 mg total) by mouth 2 (two) times daily. 60 capsule 0  . busPIRone (BUSPAR) 10 MG tablet Take 1 tablet (10 mg total) by mouth 3 (three) times daily. 270 tablet 3  . citalopram (CELEXA) 20 MG tablet Take 1 tablet (20 mg total) by mouth daily. 90 tablet 3  . lisinopril (ZESTRIL) 40 MG tablet TAKE 1 TABLET BY MOUTH  DAILY 90 tablet 3  . omeprazole (PRILOSEC) 40 MG capsule Take 40 mg by mouth 2 (two) times daily.      Current Facility-Administered Medications  Medication Dose Route Frequency Provider Last Rate Last Admin  . diphenoxylate-atropine (LOMOTIL) 2.5-0.025 MG per tablet 1 tablet  1 tablet Oral QID PRN Jerrol Banana., MD      . diphenoxylate-atropine (LOMOTIL) 2.5-0.025 MG per tablet 1 tablet  1 tablet Oral QID PRN Jerrol Banana., MD      . promethazine [facility]) tablet 25 mg  25 mg Oral Once Jerrol Banana., MD      . promethazine [facility]) tablet 25 mg  25 mg Oral Q6H PRN Jerrol Banana., MD        Medication Side Effects: None  Allergies: No Known Allergies  Past Medical History:  Diagnosis Date  . Depression     Family History  Problem Relation Age of Onset  . Dementia Mother   .  Heart disease Father   . Heart disease Brother 30's  . Heart disease Brother 23's  . Peptic Ulcer Disease Brother        removal of part if his esophagus.  . GER disease Brother     Social History   Socioeconomic History  . Marital status: Single    Spouse name: single  . Number of children: 2  . Years of education: 73  . Highest education level: Not on file  Occupational History  . 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 Smoking status: Never Smoker  . Smokeless tobacco: Never Used  Substance and Sexual Activity  . Alcohol use: Yes     Comment: wine ususally on the weekend  . Drug use: No  . Sexual activity: Not on file  Other Topics Concern  . Not on file  Social History Narrative  . Not on file   Social Determinants of Health   Financial Resource Strain:   . Difficulty of Paying Living Expenses: Not on file  Food Insecurity:   . Worried About Programme researcher, broadcasting/film/video in the Last Year: Not on file  . Ran Out of Food in the Last Year: Not on file  Transportation Needs:   . Lack of Transportation (Medical): Not on file  . Lack of Transportation (Non-Medical): Not on file  Physical Activity:   . Days of Exercise per Week: Not on file  . Minutes of Exercise per Session: Not on file  Stress:   . Feeling of Stress : Not on file  Social Connections:   . Frequency of Communication with Friends and Family: Not on file  . Frequency of Social Gatherings with Friends and Family: Not on file  . Attends Religious Services: Not on file  . Active Member of Clubs or Organizations: Not on file  . Attends Banker Meetings: Not on file  . Marital Status: Not on file  Intimate Partner Violence:   . Fear of Current or Ex-Partner: Not on file  . Emotionally Abused: Not on file  . Physically Abused: Not on file  . Sexually Abused: Not on file    Past Medical History, Surgical history, Social history, and Family history were reviewed and updated as appropriate.   Please see review of systems for further details on the patient's review from today.   Objective:   Physical Exam:  There were no vitals taken for this visit.  Physical Exam Constitutional:      General: He is not in acute distress.    Appearance: He is well-developed.  Musculoskeletal:        General: No deformity.  Neurological:     Mental Status: He is alert and oriented to person, place, and time.     Coordination: Coordination normal.  Psychiatric:        Attention and Perception: Attention and perception normal. He does not perceive auditory  or visual hallucinations.        Mood and Affect: Mood normal. Mood is not anxious or depressed. Affect is not labile, blunt, angry or inappropriate.        Speech: Speech normal.        Behavior: Behavior normal.        Thought Content: Thought content normal. Thought content is not paranoid or delusional. Thought content does not include homicidal or suicidal ideation. Thought content does not include homicidal or suicidal plan.        Cognition and Memory: Cognition  and memory normal.        Judgment: Judgment normal.     Comments: Insight intact     Lab Review:     Component Value Date/Time   NA 139 12/17/2018 0820   K 4.6 12/17/2018 0820   CL 99 12/17/2018 0820   CO2 24 12/17/2018 0820   GLUCOSE 98 12/17/2018 0820   BUN 14 12/17/2018 0820   CREATININE 1.11 12/17/2018 0820   CALCIUM 9.4 12/17/2018 0820   PROT 6.8 12/17/2018 0820   ALBUMIN 4.6 12/17/2018 0820   AST 20 12/17/2018 0820   ALT 19 12/17/2018 0820   ALKPHOS 79 12/17/2018 0820   BILITOT 0.4 12/17/2018 0820   GFRNONAA 74 12/17/2018 0820   GFRAA 85 12/17/2018 0820       Component Value Date/Time   WBC 6.7 12/17/2018 0820   RBC 4.48 12/17/2018 0820   HGB 13.8 12/17/2018 0820   HCT 40.8 12/17/2018 0820   PLT 301 12/17/2018 0820   MCV 91 12/17/2018 0820   MCH 30.8 12/17/2018 0820   MCHC 33.8 12/17/2018 0820   RDW 12.7 12/17/2018 0820   LYMPHSABS 1.6 12/17/2018 0820   EOSABS 0.1 12/17/2018 0820   BASOSABS 0.0 12/17/2018 0820    No results found for: POCLITH, LITHIUM   No results found for: PHENYTOIN, PHENOBARB, VALPROATE, CBMZ   .res Assessment: Plan:    Plan:  1. Continue Celexa 20mg  daily 2. Continue Buspar 10mg  TID 3. Continue Adderall XR 20mg  capsule BID   4. Add Trintellix 5mg  daily 7 x days, then 10mg  daily - samples given   Patient advised to contact office with any questions, adverse effects, or acute worsening in signs and symptoms.  Discussed potential benefits, risks, and side effects  of stimulants with patient to include increased heart rate, palpitations, insomnia, increased anxiety, increased irritability, or decreased appetite.  Instructed patient to contact office if experiencing any significant tolerability issues.  RTC 3 months  Kerin was seen today for anxiety, depression and adhd.  Diagnoses and all orders for this visit:  Generalized anxiety disorder  Depression, unspecified depression type  Attention deficit hyperactivity disorder (ADHD), combined type -     amphetamine-dextroamphetamine (ADDERALL XR) 20 MG 24 hr capsule; Take 1 capsule (20 mg total) by mouth 2 (two) times daily. -     amphetamine-dextroamphetamine (ADDERALL XR) 20 MG 24 hr capsule; Take 1 capsule (20 mg total) by mouth 2 (two) times daily. -     amphetamine-dextroamphetamine (ADDERALL XR) 20 MG 24 hr capsule; Take 1 capsule (20 mg total) by mouth 2 (two) times daily.     Please see After Visit Summary for patient specific instructions.  Future Appointments  Date Time Provider Department Center  02/06/2020  2:00 PM ., MD [facility]    No orders of the defined types were placed in this encounter.   -------------------------------

## 2019-12-23 ENCOUNTER — Ambulatory Visit: Payer: Self-pay | Attending: Internal Medicine

## 2019-12-23 DIAGNOSIS — Z23 Encounter for immunization: Secondary | ICD-10-CM | POA: Insufficient documentation

## 2019-12-23 NOTE — Progress Notes (Signed)
   Covid-19 Vaccination Clinic  Name:  James Arellano    MRN: [id number] DOB: [birth year]  12/23/2019  James Arellano was observed post Covid-19 immunization for 15 minutes without incidence. He was provided with Vaccine Information Sheet and instruction to access the V-Safe system.   James Arellano was instructed to call 911 with any severe reactions post vaccine: Marland Kitchen Difficulty breathing  . Swelling of your face and throat  . A fast heartbeat  . A bad rash all over your body  . Dizziness and weakness    Immunizations Administered    Name Date Dose VIS Date Route   Pfizer COVID-19 Vaccine 12/23/2019 11:06 AM 0.3 mL 10/14/2019 Intramuscular   Manufacturer: [employer], [employer]   Lot: [id number]   NDC: [address]-9

## 2020-01-05 ENCOUNTER — Other Ambulatory Visit: Payer: Self-pay | Admitting: Adult Health

## 2020-01-05 DIAGNOSIS — F329 Major depressive disorder, single episode, unspecified: Secondary | ICD-10-CM

## 2020-01-05 DIAGNOSIS — F411 Generalized anxiety disorder: Secondary | ICD-10-CM

## 2020-01-05 DIAGNOSIS — F32A Depression, unspecified: Secondary | ICD-10-CM

## 2020-01-05 NOTE — Telephone Encounter (Signed)
Patient called and  Left a message said that the adderall is woking but he would like to increase the dose. Please send in a new script to the [facility] [address].

## 2020-01-09 MED ORDER — VORTIOXETINE HBR 20 MG PO TABS: 20.0000 mg | ORAL_TABLET | Freq: Every day | ORAL | 2 refills | Status: DC

## 2020-01-09 NOTE — Telephone Encounter (Signed)
Left message to call back with current dose of Adderall

## 2020-01-09 NOTE — Telephone Encounter (Signed)
Pt returned call. He needs Rx for Trintellix. Was given starter kit and will be out Thursday. Pt asking to increase to next dose and sent 1 month to local [facility] [address] and Rx to mail order [facility] there after.Needs both Rx sent in . DOES NOT NEED ADDERALL  

## 2020-01-09 NOTE — Telephone Encounter (Signed)
Script sent  

## 2020-01-17 ENCOUNTER — Ambulatory Visit: Payer: Self-pay | Attending: Internal Medicine

## 2020-01-17 DIAGNOSIS — Z23 Encounter for immunization: Secondary | ICD-10-CM

## 2020-01-17 NOTE — Progress Notes (Signed)
   Covid-19 Vaccination Clinic  Name:  James Arellano    MRN: [id number] DOB: 1961/08/12  01/17/2020  [name] was observed post Covid-19 immunization for 15 minutes without incident. He was provided with Vaccine Information Sheet and instruction to access the V-Safe system.   [name] was instructed to call 911 with any severe reactions post vaccine: Marland Kitchen Difficulty breathing  . Swelling of face and throat  . A fast heartbeat  . A bad rash all over body  . Dizziness and weakness   Immunizations Administered    Name Date Dose VIS Date Route   Pfizer COVID-19 Vaccine 01/17/2020 10:57 AM 0.3 mL 10/14/2019 Intramuscular   Manufacturer: [employer], [employer]   Lot: [id number]   NDC: [address]-2

## 2020-02-01 ENCOUNTER — Other Ambulatory Visit: Payer: Self-pay

## 2020-02-01 DIAGNOSIS — F411 Generalized anxiety disorder: Secondary | ICD-10-CM

## 2020-02-01 DIAGNOSIS — F32A Depression, unspecified: Secondary | ICD-10-CM

## 2020-02-01 DIAGNOSIS — F329 Major depressive disorder, single episode, unspecified: Secondary | ICD-10-CM

## 2020-02-01 MED ORDER — VORTIOXETINE HBR 20 MG PO TABS: 20.0000 mg | ORAL_TABLET | Freq: Every day | ORAL | 0 refills | Status: DC

## 2020-02-06 ENCOUNTER — Encounter: Payer: Self-pay | Admitting: Family Medicine

## 2020-02-14 NOTE — Progress Notes (Signed)
Complete physical exam      I,April Miller,acting as a scribe for Wilhemena Durie, MD.,have documented all relevant documentation on the behalf of Wilhemena Durie, MD,as directed by  Wilhemena Durie, MD while in the presence of Wilhemena Durie, MD.  Patient: James Arellano   DOB: Feb 22, 1961   59 y.o. Male  MRN: [id number] Visit Date: 02/15/2020  Today's healthcare provider: Wilhemena Durie, MD  Subjective:    Chief Complaint  Patient presents with  . Annual Exam    KHOA OPDAHL is a 59 y.o. male who presents today for a complete physical exam. HPI  Overall patient feels well.  He has a 10-year common-law marriage with his present partner.  [place] country in [place].  He has a daughter who is now a Systems developer and his son who is a Administrator, arts at [employer]  He is exercising regularly on a treadmill and elliptical machine. Blood pressure at home runs between 1 34-9 50 systolic over 17-915 diastolic.  Past Medical History:  Diagnosis Date  . Depression    Past Surgical History:  Procedure Laterality Date  . ESOPHAGOGASTRODUODENOSCOPY ENDOSCOPY  2015   Social History   Socioeconomic History  . Marital status: Single    Spouse name: single  . Number of children: 2  . Years of education: 23  . Highest education level: Not on file  Occupational History  . Occupation: employeed with [employer]  Tobacco Use  . Smoking status: Never Smoker  . Smokeless tobacco: Never Used  Substance and Sexual Activity  . Alcohol use: Yes    Comment: wine ususally on the weekend  . Drug use: No  . Sexual activity: Not on file  Other Topics Concern  . Not on file  Social History Narrative  . Not on file   Social Determinants of Health   Financial Resource Strain:   . Difficulty of Paying Living Expenses:   Food Insecurity:   . Worried About Charity fundraiser in the Last Year:   . Arboriculturist in the Last  Year:   Transportation Needs:   . Film/video editor (Medical):   Marland Kitchen Lack of Transportation (Non-Medical):   Physical Activity:   . Days of Exercise per Week:   . Minutes of Exercise per Session:   Stress:   . Feeling of Stress :   Social Connections:   . Frequency of Communication with Friends and Family:   . Frequency of Social Gatherings with Friends and Family:   . Attends Religious Services:   . Active Member of Clubs or Organizations:   . Attends Archivist Meetings:   Marland Kitchen Marital Status:   Intimate Partner Violence:   . Fear of Current or Ex-Partner:   . Emotionally Abused:   Marland Kitchen Physically Abused:   . Sexually Abused:    Family Status  Relation Name Status  . Mother  Deceased at age 39       Due to natural causes  . Father  Deceased at age 59       cause of death MI  . Sister [facility]  . Brother [employer]  . Sister [employer]  . Sister Jonny Ruiz  . Brother [employer]  . Brother John Deceased at age 76's       Cause of death- Kidney failure, alcoholism  . Brother [employer]  . Brother [employer]  Family History  Problem Relation Age of Onset  . Dementia Mother   . Heart disease Father   . Heart disease Brother 30's  . Heart disease Brother 31's  . Peptic Ulcer Disease Brother        removal of part if his esophagus.  . GER disease Brother    No Known Allergies  Patient Care Team: Jerrol Banana., MD as PCP - General (Family Medicine)   Medications: Outpatient Medications Prior to Visit  Medication Sig  . amLODipine (NORVASC) 5 MG tablet TAKE 1 TABLET BY MOUTH  DAILY  . amphetamine-dextroamphetamine (ADDERALL XR) 20 MG 24 hr capsule Take 1 capsule (20 mg total) by mouth 2 (two) times daily.  Marland Kitchen amphetamine-dextroamphetamine (ADDERALL XR) 20 MG 24 hr capsule Take 1 capsule (20 mg total) by mouth 2 (two) times daily.  Marland Kitchen amphetamine-dextroamphetamine (ADDERALL XR) 20 MG 24 hr capsule Take 1 capsule (20 mg total) by mouth  2 (two) times daily.  . busPIRone (BUSPAR) 10 MG tablet Take 1 tablet (10 mg total) by mouth 3 (three) times daily.  Marland Kitchen lisinopril (ZESTRIL) 40 MG tablet TAKE 1 TABLET BY MOUTH  DAILY  . omeprazole (PRILOSEC) 40 MG capsule Take 40 mg by mouth 2 (two) times daily.   Marland Kitchen vortioxetine HBr (TRINTELLIX) 20 MG TABS tablet Take 1 tablet (20 mg total) by mouth daily.  . citalopram (CELEXA) 20 MG tablet Take 1 tablet (20 mg total) by mouth daily. (Patient not taking: Reported on 02/15/2020)   Facility-Administered Medications Prior to Visit  Medication Dose Route Frequency Provider  . diphenoxylate-atropine (LOMOTIL) 2.5-0.025 MG per tablet 1 tablet  1 tablet Oral QID PRN Jerrol Banana., MD  . diphenoxylate-atropine (LOMOTIL) 2.5-0.025 MG per tablet 1 tablet  1 tablet Oral QID PRN Jerrol Banana., MD  . promethazine [facility]) tablet 25 mg  25 mg Oral Once Jerrol Banana., MD  . promethazine [facility]) tablet 25 mg  25 mg Oral Q6H PRN Jerrol Banana., MD    Review of Systems  All other systems reviewed and are negative.        Objective:    BP 126/78 (BP Location: Right Arm, Patient Position: Sitting, Cuff Size: Large)   Pulse 76   Temp (!) 97.3 F (36.3 C) (Other (Comment))   Resp 16   Ht 6' (1.829 m)   Wt 228 lb (103.4 kg)   SpO2 97%   BMI 30.92 kg/m  BP Readings from Last 3 Encounters:  02/15/20 126/78  12/13/18 132/80  10/12/18 (!) 162/88   Wt Readings from Last 3 Encounters:  02/15/20 228 lb (103.4 kg)  12/13/18 222 lb (100.7 kg)  10/12/18 234 lb (106.1 kg)      Physical Exam Vitals reviewed.  Constitutional:      Appearance: He is well-developed.  HENT:     Head: Normocephalic and atraumatic.     Right Ear: External ear normal.     Left Ear: External ear normal.     Nose: Nose normal.  Eyes:     Conjunctiva/sclera: Conjunctivae normal.     Pupils: Pupils are equal, round, and reactive to light.  Cardiovascular:     Rate and Rhythm:  Normal rate and regular rhythm.     Heart sounds: Normal heart sounds.  Pulmonary:     Effort: Pulmonary effort is normal.     Breath sounds: Normal breath sounds.  Abdominal:     General: Bowel sounds are normal.  Palpations: Abdomen is soft.  Genitourinary:    Penis: Normal.      Testes: Normal.     Prostate: Normal.     Rectum: Normal.  Musculoskeletal:        General: Normal range of motion.     Cervical back: Normal range of motion and neck supple.  Skin:    General: Skin is warm and dry.  Neurological:     General: No focal deficit present.     Mental Status: He is alert and oriented to person, place, and time.  Psychiatric:        Mood and Affect: Mood normal.        Behavior: Behavior normal.        Thought Content: Thought content normal.        Judgment: Judgment normal.       Depression Screen  PHQ 2/9 Scores 02/15/2020 10/12/2018  PHQ - 2 Score 0 0  PHQ- 9 Score 0 0    No results found for any visits on 02/15/20.    Assessment & Plan:    Routine Health Maintenance and Physical Exam  Exercise Activities and Dietary recommendations Goals   None     Immunization History  Administered Date(s) Administered  . Influenza,inj,Quad PF,6+ Mos 10/12/2018  . MMR 06/08/1995  . PFIZER SARS-COV-2 Vaccination 12/23/2019, 01/17/2020  . Td 03/19/1995  . Tdap 02/21/2016    Health Maintenance  Topic Date Due  . Hepatitis C Screening  Never done  . HIV Screening  Never done  . COLONOSCOPY  Never done  . INFLUENZA VACCINE  06/03/2020  . TETANUS/TDAP  02/20/2026    Discussed health benefits of physical activity, and encouraged him to engage in regular exercise appropriate for his age and condition.   1. Annual physical exam  - POCT urinalysis dipstick - Lipid panel - CBC w/Diff/Platelet - Comprehensive Metabolic Panel (CMET) - TSH - PSA  2. Benign hypertension   3. Prostate cancer screening  - POCT urinalysis dipstick-Normal - Lipid panel -  CBC w/Diff/Platelet - Comprehensive Metabolic Panel (CMET) - TSH - PSA  4. Essential hypertension Follow-up in 2 to 3 months with home blood pressure readings and home blood pressure cuff. - POCT urinalysis dipstick - Lipid panel - CBC w/Diff/Platelet - Comprehensive Metabolic Panel (CMET) - TSH  5. Encounter for screening fecal occult blood testing Colonoscopy and EGD were both done 10/01/2017 by Dr. Vira Agar.  Follow-up colonoscopy in 2028.  [place] no further need for EGD. - IFOBT POC (occult bld, rslt in office)-Negative.       Arlynn Stare Cranford Mon, MD  [facility] [phone] (phone) [phone] (fax)  [place]

## 2020-02-15 ENCOUNTER — Other Ambulatory Visit: Payer: Self-pay

## 2020-02-15 ENCOUNTER — Ambulatory Visit (INDEPENDENT_AMBULATORY_CARE_PROVIDER_SITE_OTHER): Payer: 59 | Admitting: Family Medicine

## 2020-02-15 ENCOUNTER — Encounter: Payer: Self-pay | Admitting: Family Medicine

## 2020-02-15 VITALS — BP 126/78 | HR 76 | Temp 97.3°F | Resp 16 | Ht 72.0 in | Wt 228.0 lb

## 2020-02-15 DIAGNOSIS — Z125 Encounter for screening for malignant neoplasm of prostate: Secondary | ICD-10-CM | POA: Diagnosis not present

## 2020-02-15 DIAGNOSIS — I10 Essential (primary) hypertension: Secondary | ICD-10-CM | POA: Diagnosis not present

## 2020-02-15 DIAGNOSIS — Z1211 Encounter for screening for malignant neoplasm of colon: Secondary | ICD-10-CM

## 2020-02-15 DIAGNOSIS — Z0000 Encounter for general adult medical examination without abnormal findings: Secondary | ICD-10-CM

## 2020-02-15 LAB — POCT URINALYSIS DIPSTICK
Appearance: NORMAL
Bilirubin, UA: NEGATIVE
Blood, UA: NEGATIVE
Glucose, UA: NEGATIVE
Ketones, UA: NEGATIVE
Leukocytes, UA: NEGATIVE
Nitrite, UA: NEGATIVE
Odor: NORMAL
Protein, UA: NEGATIVE
Spec Grav, UA: 1.01 (ref 1.010–1.025)
Urobilinogen, UA: 0.2 E.U./dL
pH, UA: 6 (ref 5.0–8.0)

## 2020-02-15 LAB — IFOBT (OCCULT BLOOD): IFOBT: NEGATIVE

## 2020-02-24 LAB — COMPREHENSIVE METABOLIC PANEL
ALT: 32 IU/L (ref 0–44)
AST: 80 IU/L — ABNORMAL HIGH (ref 0–40)
Albumin/Globulin Ratio: 1.9 (ref 1.2–2.2)
Albumin: 4.5 g/dL (ref 3.8–4.9)
Alkaline Phosphatase: 80 IU/L (ref 39–117)
BUN/Creatinine Ratio: 16 (ref 9–20)
BUN: 18 mg/dL (ref 6–24)
Bilirubin Total: 0.5 mg/dL (ref 0.0–1.2)
CO2: 22 mmol/L (ref 20–29)
Calcium: 9.5 mg/dL (ref 8.7–10.2)
Chloride: 100 mmol/L (ref 96–106)
Creatinine, Ser: 1.16 mg/dL (ref 0.76–1.27)
GFR calc Af Amer: 80 mL/min/{1.73_m2} (ref >59)
GFR calc non Af Amer: 70 mL/min/{1.73_m2} (ref >59)
Globulin, Total: 2.4 g/dL (ref 1.5–4.5)
Glucose: 97 mg/dL (ref 65–99)
Potassium: 4.3 mmol/L (ref 3.5–5.2)
Sodium: 139 mmol/L (ref 134–144)
Total Protein: 6.9 g/dL (ref 6.0–8.5)

## 2020-02-24 LAB — CBC WITH DIFFERENTIAL/PLATELET
Basophils Absolute: 0 10*3/uL (ref 0.0–0.2)
Basos: 1 %
EOS (ABSOLUTE): 0.3 10*3/uL (ref 0.0–0.4)
Eos: 4 %
Hematocrit: 41 % (ref 37.5–51.0)
Hemoglobin: 14.1 g/dL (ref 13.0–17.7)
Immature Grans (Abs): 0 10*3/uL (ref 0.0–0.1)
Immature Granulocytes: 0 %
Lymphocytes Absolute: 1.7 10*3/uL (ref 0.7–3.1)
Lymphs: 24 %
MCH: 31.1 pg (ref 26.6–33.0)
MCHC: 34.4 g/dL (ref 31.5–35.7)
MCV: 90 fL (ref 79–97)
Monocytes Absolute: 0.5 10*3/uL (ref 0.1–0.9)
Monocytes: 7 %
Neutrophils Absolute: 4.7 10*3/uL (ref 1.4–7.0)
Neutrophils: 64 %
Platelets: 290 10*3/uL (ref 150–450)
RBC: 4.54 x10E6/uL (ref 4.14–5.80)
RDW: 12.9 % (ref 11.6–15.4)
WBC: 7.3 10*3/uL (ref 3.4–10.8)

## 2020-02-24 LAB — TSH: TSH: 1.37 u[IU]/mL (ref 0.450–4.500)

## 2020-02-24 LAB — LIPID PANEL
Chol/HDL Ratio: 4 ratio (ref 0.0–5.0)
Cholesterol, Total: 205 mg/dL — ABNORMAL HIGH (ref 100–199)
HDL: 51 mg/dL (ref >39)
LDL Chol Calc (NIH): 141 mg/dL — ABNORMAL HIGH (ref 0–99)
Triglycerides: 74 mg/dL (ref 0–149)
VLDL Cholesterol Cal: 13 mg/dL (ref 5–40)

## 2020-02-24 LAB — PSA: Prostate Specific Ag, Serum: 0.8 ng/mL (ref 0.0–4.0)

## 2020-03-12 ENCOUNTER — Other Ambulatory Visit: Payer: Self-pay

## 2020-03-12 ENCOUNTER — Encounter: Payer: Self-pay | Admitting: Adult Health

## 2020-03-12 ENCOUNTER — Ambulatory Visit (INDEPENDENT_AMBULATORY_CARE_PROVIDER_SITE_OTHER): Payer: 59 | Admitting: Adult Health

## 2020-03-12 DIAGNOSIS — F411 Generalized anxiety disorder: Secondary | ICD-10-CM | POA: Diagnosis not present

## 2020-03-12 DIAGNOSIS — F902 Attention-deficit hyperactivity disorder, combined type: Secondary | ICD-10-CM | POA: Diagnosis not present

## 2020-03-12 DIAGNOSIS — F329 Major depressive disorder, single episode, unspecified: Secondary | ICD-10-CM

## 2020-03-12 DIAGNOSIS — F32A Depression, unspecified: Secondary | ICD-10-CM

## 2020-03-12 MED ORDER — AMPHETAMINE-DEXTROAMPHET ER 20 MG PO CP24: 20.0000 mg | ORAL_CAPSULE | Freq: Two times a day (BID) | ORAL | 0 refills | Status: DC

## 2020-03-12 MED ORDER — VORTIOXETINE HBR 20 MG PO TABS: 20.0000 mg | ORAL_TABLET | Freq: Every day | ORAL | 3 refills | Status: DC

## 2020-03-12 MED ORDER — BUSPIRONE HCL 10 MG PO TABS: 10.0000 mg | ORAL_TABLET | Freq: Three times a day (TID) | ORAL | 3 refills | Status: DC

## 2020-03-12 NOTE — Addendum Note (Signed)
Addended by: Dorothyann Gibbs on: 03/12/2020 07:17 PM   Modules accepted: Orders

## 2020-03-12 NOTE — Progress Notes (Signed)
James Arellano [id number] 1961-10-23 59 y.o.  Subjective:   Patient ID:  James Arellano is a 60 y.o. (DOB 06-01-1961) male.  Chief Complaint: No chief complaint on file.   HPI CLAUDIUS MICH presents to the office today for follow-up of anxiety, depression, and ADHD.  Describes mood today as "ok". Pleasant. Mood symptoms - denies depression, anxiety, and irritability. Stating "everything is going good". Decreased "sexual" side effects with switching from Celexa to Trintellix. Stable interest and motivation. He and significant other doing well. Going to [place] for 2 weeks in August. Taking medications as prescribed.  Energy levels stable. Active, has a regular exercise routine. 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 Enjoys some usual interests and activities. Lives with significant other of 10 years - has 2 adult children. One son at [place]. Daughter works as a Runner, broadcasting/film/video.  Appetite adequate. Weight stable. Sleeps well most nights. Averages 8 hours. Focus and concentration stable with Adderall. Completing tasks. Managing aspects of household. Work going well.  Denies SI or HI. Denies AH or VH.   PHQ2-9     Office Visit from 02/15/2020 in [facility] Office Visit from 10/12/2018 in [facility]  PHQ-2 Total Score  0  0  PHQ-9 Total Score  0  0       Review of Systems:  Review of Systems  Musculoskeletal: Negative for gait problem.  Neurological: Negative for tremors.  Psychiatric/Behavioral:       Please refer to HPI    Medications: I have reviewed the patient's current medications.  Current Outpatient Medications  Medication Sig Dispense Refill  . amLODipine (NORVASC) 5 MG tablet TAKE 1 TABLET BY MOUTH  DAILY 90 tablet 1  . amphetamine-dextroamphetamine (ADDERALL XR) 20 MG 24 hr capsule Take 1 capsule (20 mg total) by mouth 2 (two) times daily. 60 capsule 0  . [START ON 05/07/2020] amphetamine-dextroamphetamine (ADDERALL XR) 20  MG 24 hr capsule Take 1 capsule (20 mg total) by mouth 2 (two) times daily. 60 capsule 0  . [START ON 04/09/2020] amphetamine-dextroamphetamine (ADDERALL XR) 20 MG 24 hr capsule Take 1 capsule (20 mg total) by mouth 2 (two) times daily. 60 capsule 0  . busPIRone (BUSPAR) 10 MG tablet Take 1 tablet (10 mg total) by mouth 3 (three) times daily. 270 tablet 3  . lisinopril (ZESTRIL) 40 MG tablet TAKE 1 TABLET BY MOUTH  DAILY 90 tablet 3  . omeprazole (PRILOSEC) 40 MG capsule Take 40 mg by mouth 2 (two) times daily.     Marland Kitchen vortioxetine HBr (TRINTELLIX) 20 MG TABS tablet Take 1 tablet (20 mg total) by mouth daily. 90 tablet 3   Current Facility-Administered Medications  Medication Dose Route Frequency Provider Last Rate Last Admin  . diphenoxylate-atropine (LOMOTIL) 2.5-0.025 MG per tablet 1 tablet  1 tablet Oral QID PRN Maple Hudson., MD      . diphenoxylate-atropine (LOMOTIL) 2.5-0.025 MG per tablet 1 tablet  1 tablet Oral QID PRN Maple Hudson., MD      . promethazine [facility]) tablet 25 mg  25 mg Oral Once Maple Hudson., MD      . promethazine [facility]) tablet 25 mg  25 mg Oral Q6H PRN Maple Hudson., MD        Medication Side Effects: None  Allergies: No Known Allergies  Past Medical History:  Diagnosis Date  . Depression     Family History  Problem Relation Age of Onset  .  Dementia Mother   . Heart disease Father   . Heart disease Brother 30's  . Heart disease Brother 42's  . Peptic Ulcer Disease Brother        removal of part if his esophagus.  . GER disease Brother     Social History   Socioeconomic History  . Marital status: Single    Spouse name: single  . Number of children: 2  . Years of education: 61  . Highest education level: Not on file  Occupational History  . Occupation: employeed with [place] [employer]  Tobacco Use  . Smoking status: Never Smoker  . Smokeless tobacco: Never Used  Substance and Sexual Activity   . Alcohol use: Yes    Comment: wine ususally on the weekend  . Drug use: No  . Sexual activity: Not on file  Other Topics Concern  . Not on file  Social History Narrative  . Not on file   Social Determinants of Health   Financial Resource Strain:   . Difficulty of Paying Living Expenses:   Food Insecurity:   . Worried About Charity fundraiser in the Last Year:   . Arboriculturist in the Last Year:   Transportation Needs:   . Film/video editor (Medical):   Marland Kitchen Lack of Transportation (Non-Medical):   Physical Activity:   . Days of Exercise per Week:   . Minutes of Exercise per Session:   Stress:   . Feeling of Stress :   Social Connections:   . Frequency of Communication with Friends and Family:   . Frequency of Social Gatherings with Friends and Family:   . Attends Religious Services:   . Active Member of Clubs or Organizations:   . Attends Archivist Meetings:   Marland Kitchen Marital Status:   Intimate Partner Violence:   . Fear of Current or Ex-Partner:   . Emotionally Abused:   Marland Kitchen Physically Abused:   . Sexually Abused:     Past Medical History, Surgical history, Social history, and Family history were reviewed and updated as appropriate.   Please see review of systems for further details on the patient's review from today.   Objective:   Physical Exam:  There were no vitals taken for this visit.  Physical Exam Constitutional:      General: He is not in acute distress. Musculoskeletal:        General: No deformity.  Neurological:     Mental Status: He is alert and oriented to person, place, and time.     Coordination: Coordination normal.  Psychiatric:        Attention and Perception: Attention and perception normal. He does not perceive auditory or visual hallucinations.        Mood and Affect: Mood normal. Mood is not anxious or depressed. Affect is not labile, blunt, angry or inappropriate.        Speech: Speech normal.        Behavior: Behavior  normal.        Thought Content: Thought content normal. Thought content is not paranoid or delusional. Thought content does not include homicidal or suicidal ideation. Thought content does not include homicidal or suicidal plan.        Cognition and Memory: Cognition and memory normal.        Judgment: Judgment normal.     Comments: Insight intact     Lab Review:     Component Value Date/Time   NA 139 02/23/2020 0853  K 4.3 02/23/2020 0853   CL 100 02/23/2020 0853   CO2 22 02/23/2020 0853   GLUCOSE 97 02/23/2020 0853   BUN 18 02/23/2020 0853   CREATININE 1.16 02/23/2020 0853   CALCIUM 9.5 02/23/2020 0853   PROT 6.9 02/23/2020 0853   ALBUMIN 4.5 02/23/2020 0853   AST 80 (H) 02/23/2020 0853   ALT 32 02/23/2020 0853   ALKPHOS 80 02/23/2020 0853   BILITOT 0.5 02/23/2020 0853   GFRNONAA 70 02/23/2020 0853   GFRAA 80 02/23/2020 0853       Component Value Date/Time   WBC 7.3 02/23/2020 0853   RBC 4.54 02/23/2020 0853   HGB 14.1 02/23/2020 0853   HCT 41.0 02/23/2020 0853   PLT 290 02/23/2020 0853   MCV 90 02/23/2020 0853   MCH 31.1 02/23/2020 0853   MCHC 34.4 02/23/2020 0853   RDW 12.9 02/23/2020 0853   LYMPHSABS 1.7 02/23/2020 0853   EOSABS 0.3 02/23/2020 0853   BASOSABS 0.0 02/23/2020 0853    No results found for: POCLITH, LITHIUM   No results found for: PHENYTOIN, PHENOBARB, VALPROATE, CBMZ   .res Assessment: Plan:    Plan:  1. Continue Trintellix 20mg  daily 2. Continue Buspar 10mg  TID 3. Continue Adderall XR 20mg  capsule BID    Patient advised to contact office with any questions, adverse effects, or acute worsening in signs and symptoms.  Discussed potential benefits, risks, and side effects of stimulants with patient to include increased heart rate, palpitations, insomnia, increased anxiety, increased irritability, or decreased appetite.  Instructed patient to contact office if experiencing any significant tolerability issues.  RTC 3 months   Diagnoses  and all orders for this visit:  Generalized anxiety disorder -     vortioxetine HBr (TRINTELLIX) 20 MG TABS tablet; Take 1 tablet (20 mg total) by mouth daily. -     busPIRone (BUSPAR) 10 MG tablet; Take 1 tablet (10 mg total) by mouth 3 (three) times daily.  Depression, unspecified depression type -     vortioxetine HBr (TRINTELLIX) 20 MG TABS tablet; Take 1 tablet (20 mg total) by mouth daily.  Attention deficit hyperactivity disorder (ADHD), combined type -     Discontinue: amphetamine-dextroamphetamine (ADDERALL XR) 20 MG 24 hr capsule; Take 1 capsule (20 mg total) by mouth 2 (two) times daily. -     Discontinue: amphetamine-dextroamphetamine (ADDERALL XR) 20 MG 24 hr capsule; Take 1 capsule (20 mg total) by mouth 2 (two) times daily. -     Discontinue: amphetamine-dextroamphetamine (ADDERALL XR) 20 MG 24 hr capsule; Take 1 capsule (20 mg total) by mouth 2 (two) times daily. -     amphetamine-dextroamphetamine (ADDERALL XR) 20 MG 24 hr capsule; Take 1 capsule (20 mg total) by mouth 2 (two) times daily. -     amphetamine-dextroamphetamine (ADDERALL XR) 20 MG 24 hr capsule; Take 1 capsule (20 mg total) by mouth 2 (two) times daily. -     amphetamine-dextroamphetamine (ADDERALL XR) 20 MG 24 hr capsule; Take 1 capsule (20 mg total) by mouth 2 (two) times daily.     Please see After Visit Summary for patient specific instructions.  Future Appointments  Date Time Provider Department Center  05/16/2020  3:20 PM ., MD [facility]  06/12/2020  3:40 PM Raiza Kiesel, Maple Hudson, NP [facility] None    No orders of the defined types were placed in this encounter.   -------------------------------

## 2020-03-30 ENCOUNTER — Other Ambulatory Visit: Payer: Self-pay | Admitting: Family Medicine

## 2020-03-30 DIAGNOSIS — I10 Essential (primary) hypertension: Secondary | ICD-10-CM

## 2020-04-03 ENCOUNTER — Other Ambulatory Visit: Payer: Self-pay | Admitting: Family Medicine

## 2020-04-03 DIAGNOSIS — I10 Essential (primary) hypertension: Secondary | ICD-10-CM

## 2020-04-03 NOTE — Telephone Encounter (Signed)
Requested Prescriptions  Pending Prescriptions Disp Refills   amLODipine (NORVASC) 5 MG tablet [Pharmacy Med Name: AMLODIPINE  5MG   TAB] 90 tablet 1    Sig: TAKE 1 TABLET BY MOUTH  DAILY     Cardiovascular:  Calcium Channel Blockers Failed - 04/03/2020  9:48 PM      Failed - Valid encounter within last 6 months    Recent Outpatient Visits          1 month ago Annual physical exam   [facility]   1 year ago [place] (mit[place])   [facility] MD   1 year ago Benign hypertension   [facility] MD   1 year ago Annual physical exam   [facility] MD   1 year ago [place]   [facility] MD      Future Appointments            In 1 month Maple Hudson., MD [facility]           Passed - Last BP in normal range    BP Readings from Last 1 Encounters:  02/15/20 126/78         Valid encounter one month ago.  Physical one month ago.

## 2020-05-16 ENCOUNTER — Ambulatory Visit: Payer: 59 | Admitting: Family Medicine

## 2020-05-16 ENCOUNTER — Other Ambulatory Visit: Payer: Self-pay

## 2020-05-16 ENCOUNTER — Encounter: Payer: Self-pay | Admitting: Family Medicine

## 2020-05-16 VITALS — BP 131/87 | HR 79 | Temp 97.1°F | Ht 72.0 in | Wt 232.4 lb

## 2020-05-16 DIAGNOSIS — R7989 Other specified abnormal findings of blood chemistry: Secondary | ICD-10-CM

## 2020-05-16 DIAGNOSIS — I10 Essential (primary) hypertension: Secondary | ICD-10-CM | POA: Diagnosis not present

## 2020-05-16 DIAGNOSIS — Z8659 Personal history of other mental and behavioral disorders: Secondary | ICD-10-CM | POA: Diagnosis not present

## 2020-05-16 DIAGNOSIS — K22719 Barrett's esophagus with dysplasia, unspecified: Secondary | ICD-10-CM

## 2020-05-16 DIAGNOSIS — F988 Other specified behavioral and emotional disorders with onset usually occurring in childhood and adolescence: Secondary | ICD-10-CM

## 2020-05-16 NOTE — Progress Notes (Signed)
Inetta Fermo Cummings,acting as a scribe for Megan Mans, MD.,have documented all relevant documentation on the behalf of Megan Mans, MD,as directed by  Megan Mans, MD while in the presence of Megan Mans, MD.  Established patient visit   Patient: James Arellano   DOB: April 09, 1961   59 y.o. Male  MRN: [id number] Visit Date: 05/16/2020  Today's healthcare provider: Megan Mans, MD   Chief Complaint  Patient presents with  . Hypertension   Subjective    HPI  Patient feels well.  He has no complaints. Hypertension, follow-up  BP Readings from Last 3 Encounters:  05/16/20 131/87  02/15/20 126/78  12/13/18 132/80   Wt Readings from Last 3 Encounters:  05/16/20 232 lb 6.4 oz (105.4 kg)  02/15/20 228 lb (103.4 kg)  12/13/18 222 lb (100.7 kg)     He was last seen for hypertension 3 months ago.  BP at that visit was 126/80. Management since that visit includes checking bp at home.  He reports excellent compliance with treatment. He is not having side effects.  He is following a Low Sodium diet. He is exercising. He does not smoke.  Use of agents associated with hypertension: none.   Outside blood pressures are being checked.   ---------------------------------------------------------------------------------------------------     Medications: Outpatient Medications Prior to Visit  Medication Sig  . amLODipine (NORVASC) 5 MG tablet TAKE 1 TABLET BY MOUTH  DAILY  . amphetamine-dextroamphetamine (ADDERALL XR) 20 MG 24 hr capsule Take 1 capsule (20 mg total) by mouth 2 (two) times daily.  Marland Kitchen amphetamine-dextroamphetamine (ADDERALL XR) 20 MG 24 hr capsule Take 1 capsule (20 mg total) by mouth 2 (two) times daily.  Marland Kitchen amphetamine-dextroamphetamine (ADDERALL XR) 20 MG 24 hr capsule Take 1 capsule (20 mg total) by mouth 2 (two) times daily.  . busPIRone (BUSPAR) 10 MG tablet Take 1 tablet (10 mg total) by mouth 3 (three) times daily.  Marland Kitchen  lisinopril (ZESTRIL) 40 MG tablet TAKE 1 TABLET BY MOUTH  DAILY  . omeprazole (PRILOSEC) 40 MG capsule Take 40 mg by mouth 2 (two) times daily.   Marland Kitchen vortioxetine HBr (TRINTELLIX) 20 MG TABS tablet Take 1 tablet (20 mg total) by mouth daily.   Facility-Administered Medications Prior to Visit  Medication Dose Route Frequency Provider  . diphenoxylate-atropine (LOMOTIL) 2.5-0.025 MG per tablet 1 tablet  1 tablet Oral QID PRN Maple Hudson., MD  . diphenoxylate-atropine (LOMOTIL) 2.5-0.025 MG per tablet 1 tablet  1 tablet Oral QID PRN Maple Hudson., MD  . promethazine [facility]) tablet 25 mg  25 mg Oral Once Maple Hudson., MD  . promethazine [facility]) tablet 25 mg  25 mg Oral Q6H PRN Maple Hudson., MD    Review of Systems  All other systems reviewed and are negative.      Objective    BP 131/87 (BP Location: Right Arm, Patient Position: Sitting, Cuff Size: Large)   Pulse 79   Temp (!) 97.1 F (36.2 C) (Temporal)   Ht 6' (1.829 m)   Wt 232 lb 6.4 oz (105.4 kg)   BMI 31.52 kg/m  BP Readings from Last 3 Encounters:  05/16/20 131/87  02/15/20 126/78  12/13/18 132/80   Wt Readings from Last 3 Encounters:  05/16/20 232 lb 6.4 oz (105.4 kg)  02/15/20 228 lb (103.4 kg)  12/13/18 222 lb (100.7 kg)      Physical Exam Vitals reviewed.  Constitutional:  Appearance: He is well-developed.  HENT:     Head: Normocephalic and atraumatic.     Right Ear: External ear normal.     Left Ear: External ear normal.     Nose: Nose normal.  Eyes:     Conjunctiva/sclera: Conjunctivae normal.     Pupils: Pupils are equal, round, and reactive to light.  Cardiovascular:     Rate and Rhythm: Normal rate and regular rhythm.     Heart sounds: Normal heart sounds.  Pulmonary:     Effort: Pulmonary effort is normal.     Breath sounds: Normal breath sounds.  Abdominal:     General: Bowel sounds are normal.     Palpations: Abdomen is soft.  Genitourinary:     Penis: Normal.      Testes: Normal.     Prostate: Normal.     Rectum: Normal.  Musculoskeletal:        General: Normal range of motion.     Cervical back: Normal range of motion and neck supple.  Skin:    General: Skin is warm and dry.  Neurological:     General: No focal deficit present.     Mental Status: He is alert and oriented to person, place, and time.  Psychiatric:        Mood and Affect: Mood normal.        Behavior: Behavior normal.        Thought Content: Thought content normal.        Judgment: Judgment normal.       No results found for any visits on 05/16/20.  Assessment & Plan     1. Essential hypertension Get blood pressure cuff for home use.  Bring in readings and cuff on next visit.  Follow-up 2 months.  2. Elevated liver function tests Liver function test.  Probably  fatty liver. - Hepatic function panel  3. Barrett's esophagus with dysplasia   4. H/O: depression Followed by behavioral medicine  5. Attention deficit disorder, unspecified hyperactivity presence Prescription for Adderall by behavioral medicine   No follow-ups on file.      I, Megan Mans, MD, have reviewed all documentation for this visit. The documentation on 05/18/20 for the exam, diagnosis, procedures, and orders are all accurate and complete.    Kourtnie Sachs Wendelyn Breslow, MD  [facility] [phone] (phone) [phone] (fax)  [facility]

## 2020-05-16 NOTE — Patient Instructions (Signed)
GET OMRON OR WELCH ALLYN BP CUFF!!!

## 2020-05-18 ENCOUNTER — Telehealth: Payer: Self-pay

## 2020-05-18 LAB — HEPATIC FUNCTION PANEL
ALT: 18 IU/L (ref 0–44)
AST: 18 IU/L (ref 0–40)
Albumin: 4.4 g/dL (ref 3.8–4.9)
Alkaline Phosphatase: 83 IU/L (ref 48–121)
Bilirubin Total: 0.5 mg/dL (ref 0.0–1.2)
Bilirubin, Direct: 0.14 mg/dL (ref 0.00–0.40)
Total Protein: 6.9 g/dL (ref 6.0–8.5)

## 2020-05-18 NOTE — Telephone Encounter (Signed)
Patient advised of lab results through mychart and has read the providers comments.  °

## 2020-05-18 NOTE — Telephone Encounter (Signed)
-----   Message from Maple Hudson., MD sent at 05/18/2020  7:08 AM EDT ----- Liver function normal.

## 2020-06-12 ENCOUNTER — Ambulatory Visit (INDEPENDENT_AMBULATORY_CARE_PROVIDER_SITE_OTHER): Payer: 59 | Admitting: Adult Health

## 2020-06-12 ENCOUNTER — Encounter: Payer: Self-pay | Admitting: Adult Health

## 2020-06-12 ENCOUNTER — Other Ambulatory Visit: Payer: Self-pay

## 2020-06-12 DIAGNOSIS — F411 Generalized anxiety disorder: Secondary | ICD-10-CM | POA: Diagnosis not present

## 2020-06-12 DIAGNOSIS — F902 Attention-deficit hyperactivity disorder, combined type: Secondary | ICD-10-CM | POA: Diagnosis not present

## 2020-06-12 DIAGNOSIS — F329 Major depressive disorder, single episode, unspecified: Secondary | ICD-10-CM | POA: Diagnosis not present

## 2020-06-12 DIAGNOSIS — F32A Depression, unspecified: Secondary | ICD-10-CM

## 2020-06-12 MED ORDER — AMPHETAMINE-DEXTROAMPHET ER 20 MG PO CP24: 20.0000 mg | ORAL_CAPSULE | Freq: Two times a day (BID) | ORAL | 0 refills | Status: DC

## 2020-06-12 MED ORDER — BUSPIRONE HCL 10 MG PO TABS: 10.0000 mg | ORAL_TABLET | Freq: Three times a day (TID) | ORAL | 3 refills | Status: DC

## 2020-06-12 MED ORDER — VORTIOXETINE HBR 20 MG PO TABS: 20.0000 mg | ORAL_TABLET | Freq: Every day | ORAL | 3 refills | Status: DC

## 2020-06-12 NOTE — Addendum Note (Signed)
Addended by: Dorothyann Gibbs on: 06/12/2020 04:11 PM   Modules accepted: Orders

## 2020-06-12 NOTE — Progress Notes (Signed)
James Arellano [id number] 07-02-1961 59 y.o.  Subjective:   Patient ID:  James Arellano is a 59 y.o. (DOB 27-Apr-1961) male.  Chief Complaint: No chief complaint on file.   HPI James Arellano presents to the office today for follow-up of anxiety, depression, and ADHD.  Describes mood today as "ok". Pleasant. Mood symptoms - denies depression, anxiety, and irritability. Stating "I'm doing pretty good". He and significant other doing well. Upcoming vacation to [place] for 2 weeks. Stable interest and motivation. Taking medications as prescribed.  Energy levels stable. Active, has a regular exercise routine.  Enjoys some usual interests and activities. Lives with significant other of 10 years - has 2 adult children. One son at [employer]. Daughter works as a Runner, broadcasting/film/video.  Appetite adequate. Weight stable. Sleeps well most nights. Averages 8 hours. Focus and concentration stable with Adderall. Completing tasks. Managing aspects of household. Work going well. 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 Denies SI or HI. Denies AH or VH.   PHQ2-9     Office Visit from 02/15/2020 in [facility] Office Visit from 10/12/2018 in [facility]  PHQ-2 Total Score 0 0  PHQ-9 Total Score 0 0       Review of Systems:  Review of Systems  Musculoskeletal: Negative for gait problem.  Neurological: Negative for tremors.  Psychiatric/Behavioral:       Please refer to HPI    Medications: I have reviewed the patient's current medications.  Current Outpatient Medications  Medication Sig Dispense Refill   amLODipine (NORVASC) 5 MG tablet TAKE 1 TABLET BY MOUTH  DAILY 90 tablet 1   amphetamine-dextroamphetamine (ADDERALL XR) 20 MG 24 hr capsule Take 1 capsule (20 mg total) by mouth 2 (two) times daily. 60 capsule 0   amphetamine-dextroamphetamine (ADDERALL XR) 20 MG 24 hr capsule Take 1 capsule (20 mg total) by mouth 2 (two) times daily. 60 capsule 0    amphetamine-dextroamphetamine (ADDERALL XR) 20 MG 24 hr capsule Take 1 capsule (20 mg total) by mouth 2 (two) times daily. 60 capsule 0   busPIRone (BUSPAR) 10 MG tablet Take 1 tablet (10 mg total) by mouth 3 (three) times daily. 270 tablet 3   lisinopril (ZESTRIL) 40 MG tablet TAKE 1 TABLET BY MOUTH  DAILY 90 tablet 0   omeprazole (PRILOSEC) 40 MG capsule Take 40 mg by mouth 2 (two) times daily.      vortioxetine HBr (TRINTELLIX) 20 MG TABS tablet Take 1 tablet (20 mg total) by mouth daily. 90 tablet 3   Current Facility-Administered Medications  Medication Dose Route Frequency Provider Last Rate Last Admin   diphenoxylate-atropine (LOMOTIL) 2.5-0.025 MG per tablet 1 tablet  1 tablet Oral QID PRN Maple Hudson., MD       diphenoxylate-atropine (LOMOTIL) 2.5-0.025 MG per tablet 1 tablet  1 tablet Oral QID PRN Maple Hudson., MD       promethazine [facility]) tablet 25 mg  25 mg Oral Once Maple Hudson., MD       promethazine [facility]) tablet 25 mg  25 mg Oral Q6H PRN Maple Hudson., MD        Medication Side Effects: None  Allergies: No Known Allergies  Past Medical History:  Diagnosis Date   Depression     Family History  Problem Relation Age of Onset   Dementia Mother    Heart disease Father    Heart disease Brother 48's   Heart disease Brother 33's  Peptic Ulcer Disease Brother        removal of part if his esophagus.   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  Tobacco Use   Smoking status: Never Smoker   Smokeless tobacco: Never Used  Substance and Sexual Activity   Alcohol use: Yes    Comment: wine ususally on the weekend   Drug use: No   Sexual activity: Not on file   Other Topics Concern   Not on file  Social History Narrative   Not on file   Social Determinants of Health   Financial Resource Strain:    Difficulty of Paying Living Expenses:   Food Insecurity:    Worried About Programme researcher, broadcasting/film/video in the Last Year:    Barista in the Last Year:   Transportation Needs:    Freight forwarder (Medical):    Lack of Transportation (Non-Medical):   Physical Activity:    Days of Exercise per Week:    Minutes of Exercise per Session:   Stress:    Feeling of Stress :   Social Connections:    Frequency of Communication with Friends and Family:    Frequency of Social Gatherings with Friends and Family:    Attends Religious Services:    Active Member of Clubs or Organizations:    Attends Engineer, structural:    Marital Status:   Intimate Partner Violence:    Fear of Current or Ex-Partner:    Emotionally Abused:    Physically Abused:    Sexually Abused:     Past Medical History, Surgical history, Social history, and Family history were reviewed and updated as appropriate.   Please see review of systems for further details on the patient's review from today.   Objective:   Physical Exam:  There were no vitals taken for this visit.  Physical Exam Constitutional:      General: He is not in acute distress. Musculoskeletal:        General: No deformity.  Neurological:     Mental Status: He is alert and oriented to person, place, and time.     Coordination: Coordination normal.  Psychiatric:        Attention and Perception: Attention and perception normal. He does not perceive auditory or visual hallucinations.        Mood and Affect: Mood normal. Mood is not anxious or depressed. Affect is not labile, blunt, angry or inappropriate.        Speech: Speech normal.        Behavior: Behavior normal.        Thought Content: Thought content normal. Thought content is not paranoid or delusional. Thought  content does not include homicidal or suicidal ideation. Thought content does not include homicidal or suicidal plan.        Cognition and Memory: Cognition and memory normal.        Judgment: Judgment normal.     Comments: Insight intact     Lab Review:     Component Value Date/Time   NA 139 02/23/2020 0853   K 4.3 02/23/2020 0853   CL 100 02/23/2020 0853   CO2 22 02/23/2020 0853   GLUCOSE 97 02/23/2020  0853   BUN 18 02/23/2020 0853   CREATININE 1.16 02/23/2020 0853   CALCIUM 9.5 02/23/2020 0853   PROT 6.9 05/17/2020 0753   ALBUMIN 4.4 05/17/2020 0753   AST 18 05/17/2020 0753   ALT 18 05/17/2020 0753   ALKPHOS 83 05/17/2020 0753   BILITOT 0.5 05/17/2020 0753   GFRNONAA 70 02/23/2020 0853   GFRAA 80 02/23/2020 0853       Component Value Date/Time   WBC 7.3 02/23/2020 0853   RBC 4.54 02/23/2020 0853   HGB 14.1 02/23/2020 0853   HCT 41.0 02/23/2020 0853   PLT 290 02/23/2020 0853   MCV 90 02/23/2020 0853   MCH 31.1 02/23/2020 0853   MCHC 34.4 02/23/2020 0853   RDW 12.9 02/23/2020 0853   LYMPHSABS 1.7 02/23/2020 0853   EOSABS 0.3 02/23/2020 0853   BASOSABS 0.0 02/23/2020 0853    No results found for: POCLITH, LITHIUM   No results found for: PHENYTOIN, PHENOBARB, VALPROATE, CBMZ   .res Assessment: Plan:    Plan:  1. Continue Trintellix 20mg  daily 2. Continue Buspar 10mg  TID 3. Continue Adderall XR 20mg  capsule BID    BP 131/87  Patient advised to contact office with any questions, adverse effects, or acute worsening in signs and symptoms.  Discussed potential benefits, risks, and side effects of stimulants with patient to include increased heart rate, palpitations, insomnia, increased anxiety, increased irritability, or decreased appetite.  Instructed patient to contact office if experiencing any significant tolerability issues.  RTC 3 months   Diagnoses and all orders for this visit:  Generalized anxiety disorder  Depression, unspecified depression  type  Attention deficit hyperactivity disorder (ADHD), combined type     Please see After Visit Summary for patient specific instructions.  Future Appointments  Date Time Provider Department Center  07/18/2020  3:40 PM ., MD [facility]    No orders of the defined types were placed in this encounter.   -------------------------------

## 2020-06-23 ENCOUNTER — Other Ambulatory Visit: Payer: Self-pay | Admitting: Family Medicine

## 2020-06-23 DIAGNOSIS — I10 Essential (primary) hypertension: Secondary | ICD-10-CM

## 2020-06-23 NOTE — Telephone Encounter (Signed)
Requested Prescriptions  Pending Prescriptions Disp Refills   lisinopril (ZESTRIL) 40 MG tablet [Pharmacy Med Name: Lisinopril 40 MG Oral Tablet] 90 tablet 0    Sig: TAKE 1 TABLET BY MOUTH  DAILY     Cardiovascular:  ACE Inhibitors Passed - 06/23/2020  9:57 PM      Passed - Cr in normal range and within 180 days    Creatinine, Ser  Date Value Ref Range Status  02/23/2020 1.16 0.76 - 1.27 mg/dL Final         Passed - K in normal range and within 180 days    Potassium  Date Value Ref Range Status  02/23/2020 4.3 3.5 - 5.2 mmol/L Final         Passed - Patient is not pregnant      Passed - Last BP in normal range    BP Readings from Last 1 Encounters:  05/16/20 131/87         Passed - Valid encounter within last 6 months    Recent Outpatient Visits          1 month ago Essential hypertension   [facility] Maple Hudson., MD   4 months ago Annual physical exam   [facility] Maple Hudson., MD   1 year ago Chiggers (mites)   [facility] Maple Hudson., MD   1 year ago Benign hypertension   [facility] Maple Hudson., MD   1 year ago Annual physical exam   [facility] Maple Hudson., MD      Future Appointments            In 3 weeks Maple Hudson., MD [facility]

## 2020-07-18 ENCOUNTER — Ambulatory Visit: Payer: Self-pay | Admitting: Family Medicine

## 2020-09-03 ENCOUNTER — Other Ambulatory Visit: Payer: Self-pay | Admitting: Family Medicine

## 2020-09-03 DIAGNOSIS — I10 Essential (primary) hypertension: Secondary | ICD-10-CM

## 2020-09-03 NOTE — Telephone Encounter (Signed)
Requested medication (s) are due for refill today: yes   Requested medication (s) are on the active medication list: yes   Last refill:  06/23/20 #90 0 refills   Future visit scheduled: no  Notes to clinic:  last labs drawn 02/23/20     Requested Prescriptions  Pending Prescriptions Disp Refills   lisinopril (ZESTRIL) 40 MG tablet [Pharmacy Med Name: Lisinopril 40 MG Oral Tablet] 90 tablet 3    Sig: TAKE 1 TABLET BY MOUTH  DAILY      Cardiovascular:  ACE Inhibitors Failed - 09/03/2020  4:22 PM      Failed - Cr in normal range and within 180 days    Creatinine, Ser  Date Value Ref Range Status  02/23/2020 1.16 0.76 - 1.27 mg/dL Final          Failed - K in normal range and within 180 days    Potassium  Date Value Ref Range Status  02/23/2020 4.3 3.5 - 5.2 mmol/L Final          Passed - Patient is not pregnant      Passed - Last BP in normal range    BP Readings from Last 1 Encounters:  05/16/20 131/87          Passed - Valid encounter within last 6 months    Recent Outpatient Visits           3 months ago Essential hypertension   [facility] Maple Hudson., MD   6 months ago Annual physical exam   [facility] Maple Hudson., MD   1 year ago Chiggers (mites)   [facility] Maple Hudson., MD   1 year ago Benign hypertension   [facility] Maple Hudson., MD   1 year ago Annual physical exam   [facility] Maple Hudson., MD               Signed Prescriptions Disp Refills   amLODipine (NORVASC) 5 MG tablet 90 tablet 1    Sig: TAKE 1 TABLET BY MOUTH  DAILY      Cardiovascular:  Calcium Channel Blockers Passed - 09/03/2020  4:22 PM      Passed - Last BP in normal range    BP Readings from Last 1 Encounters:  05/16/20 131/87          Passed - Valid encounter within last 6 months    Recent Outpatient Visits           3 months ago Essential  hypertension   [facility] Maple Hudson., MD   6 months ago Annual physical exam   [facility] Maple Hudson., MD   1 year ago Chiggers (mites)   [facility] Maple Hudson., MD   1 year ago Benign hypertension   [facility] Maple Hudson., MD   1 year ago Annual physical exam   [facility] Maple Hudson., MD

## 2020-09-12 ENCOUNTER — Ambulatory Visit (INDEPENDENT_AMBULATORY_CARE_PROVIDER_SITE_OTHER): Payer: 59 | Admitting: Adult Health

## 2020-09-12 ENCOUNTER — Other Ambulatory Visit: Payer: Self-pay

## 2020-09-12 ENCOUNTER — Encounter: Payer: Self-pay | Admitting: Adult Health

## 2020-09-12 DIAGNOSIS — F902 Attention-deficit hyperactivity disorder, combined type: Secondary | ICD-10-CM | POA: Diagnosis not present

## 2020-09-12 DIAGNOSIS — F411 Generalized anxiety disorder: Secondary | ICD-10-CM

## 2020-09-12 DIAGNOSIS — F32A Depression, unspecified: Secondary | ICD-10-CM | POA: Diagnosis not present

## 2020-09-12 MED ORDER — AMPHETAMINE-DEXTROAMPHET ER 20 MG PO CP24: 20.0000 mg | ORAL_CAPSULE | Freq: Two times a day (BID) | ORAL | 0 refills | Status: DC

## 2020-09-12 NOTE — Progress Notes (Signed)
James Arellano [id number] 26-Mar-1961 59 y.o.  Subjective:   Patient ID:  James Arellano is a 59 y.o. (DOB 1961-03-15) male.  Chief Complaint: No chief complaint on file.   HPI James Arellano presents to the office today for follow-up of anxiety, depression, and ADHD.  Describes mood today as "ok". Pleasant. Mood symptoms - denies depression, anxiety, and irritability. Stating "I'm doing alright". Would like to switch from Trintellix 20mg  daily back to Celexa 20mg  daily. Has not felt a significant improvement. Feels like the Celexa works better overall. He and significant other doing well. Stable interest and motivation. Taking medications as prescribed.  Energy levels stable. Active, has a regular exercise routine.  Enjoys some usual interests and activities. Lives with significant other of 10 years - has 2 adult children. One son at [employer]. Daughter works as a .  Appetite adequate. Weight loss. Sleeps well most nights. Averages 8 hours. Focus and concentration stable with Adderall. Completing tasks. Managing aspects of household. Work going well. Works for . Denies SI or HI. Denies AH or VH.    PHQ2-9     Office Visit from 02/15/2020 in [facility] Office Visit from 10/12/2018 in [facility]  PHQ-2 Total Score 0 0  PHQ-9 Total Score 0 0       Review of Systems:  Review of Systems  Musculoskeletal: Negative for gait problem.  Neurological: Negative for tremors.  Psychiatric/Behavioral:       Please refer to HPI    Medications: I have reviewed the patient's current medications.  Current Outpatient Medications  Medication Sig Dispense Refill  . amLODipine (NORVASC) 5 MG tablet TAKE 1 TABLET BY MOUTH  DAILY 90 tablet 1  . amphetamine-dextroamphetamine (ADDERALL XR) 20 MG 24 hr capsule Take 1 capsule (20 mg total) by mouth 2 (two) times daily. 60 capsule 0  . [START ON 10/10/2020] amphetamine-dextroamphetamine  (ADDERALL XR) 20 MG 24 hr capsule Take 1 capsule (20 mg total) by mouth 2 (two) times daily. 60 capsule 0  . [START ON 11/07/2020] amphetamine-dextroamphetamine (ADDERALL XR) 20 MG 24 hr capsule Take 1 capsule (20 mg total) by mouth 2 (two) times daily. 60 capsule 0  . busPIRone (BUSPAR) 10 MG tablet Take 1 tablet (10 mg total) by mouth 3 (three) times daily. 270 tablet 3  . lisinopril (ZESTRIL) 40 MG tablet TAKE 1 TABLET BY MOUTH  DAILY 90 tablet 3  . omeprazole (PRILOSEC) 40 MG capsule Take 40 mg by mouth 2 (two) times daily.     14/06/2020 vortioxetine HBr (TRINTELLIX) 20 MG TABS tablet Take 1 tablet (20 mg total) by mouth daily. 90 tablet 3   Current Facility-Administered Medications  Medication Dose Route Frequency Provider Last Rate Last Admin  . diphenoxylate-atropine (LOMOTIL) 2.5-0.025 MG per tablet 1 tablet  1 tablet Oral QID PRN Marland Kitchen., MD      . diphenoxylate-atropine (LOMOTIL) 2.5-0.025 MG per tablet 1 tablet  1 tablet Oral QID PRN Maple Hudson., MD      . promethazine [facility]) tablet 25 mg  25 mg Oral Once Maple Hudson., MD      . promethazine [facility]) tablet 25 mg  25 mg Oral Q6H PRN Maple Hudson., MD        Medication Side Effects: None  Allergies: No Known Allergies  Past Medical History:  Diagnosis Date  . Depression     Family History  Problem Relation Age of Onset  .  Dementia Mother   . Heart disease Father   . Heart disease Brother 30's  . Heart disease Brother 12's  . Peptic Ulcer Disease Brother        removal of part if his esophagus.  . GER disease Brother     Social History   Socioeconomic History  . Marital status: Single    Spouse name: single  . Number of children: 2  . Years of education: 86  . Highest education level: Not on file  Occupational History  . Occupation: employeed with [employer]  Tobacco Use  . Smoking status: Never Smoker  . Smokeless tobacco: Never Used  Substance and  Sexual Activity  . Alcohol use: Yes    Comment: wine ususally on the weekend  . Drug use: No  . Sexual activity: Not on file  Other Topics Concern  . Not on file  Social History Narrative  . Not on file   Social Determinants of Health   Financial Resource Strain:   . Difficulty of Paying Living Expenses: Not on file  Food Insecurity:   . Worried About Programme researcher, broadcasting/film/video in the Last Year: Not on file  . Ran Out of Food in the Last Year: Not on file  Transportation Needs:   . Lack of Transportation (Medical): Not on file  . Lack of Transportation (Non-Medical): Not on file  Physical Activity:   . Days of Exercise per Week: Not on file  . Minutes of Exercise per Session: Not on file  Stress:   . Feeling of Stress : Not on file  Social Connections:   . Frequency of Communication with Friends and Family: Not on file  . Frequency of Social Gatherings with Friends and Family: Not on file  . Attends Religious Services: Not on file  . Active Member of Clubs or Organizations: Not on file  . Attends Banker Meetings: Not on file  . Marital Status: Not on file  Intimate Partner Violence:   . Fear of Current or Ex-Partner: Not on file  . Emotionally Abused: Not on file  . Physically Abused: Not on file  . Sexually Abused: Not on file    Past Medical History, Surgical history, Social history, and Family history were reviewed and updated as appropriate.   Please see review of systems for further details on the patient's review from today.   Objective:   Physical Exam:  There were no vitals taken for this visit.  Physical Exam  Lab Review:     Component Value Date/Time   NA 139 02/23/2020 0853   K 4.3 02/23/2020 0853   CL 100 02/23/2020 0853   CO2 22 02/23/2020 0853   GLUCOSE 97 02/23/2020 0853   BUN 18 02/23/2020 0853   CREATININE 1.16 02/23/2020 0853   CALCIUM 9.5 02/23/2020 0853   PROT 6.9 05/17/2020 0753   ALBUMIN 4.4 05/17/2020 0753   AST 18  05/17/2020 0753   ALT 18 05/17/2020 0753   ALKPHOS 83 05/17/2020 0753   BILITOT 0.5 05/17/2020 0753   GFRNONAA 70 02/23/2020 0853   GFRAA 80 02/23/2020 0853       Component Value Date/Time   WBC 7.3 02/23/2020 0853   RBC 4.54 02/23/2020 0853   HGB 14.1 02/23/2020 0853   HCT 41.0 02/23/2020 0853   PLT 290 02/23/2020 0853   MCV 90 02/23/2020 0853   MCH 31.1 02/23/2020 0853   MCHC 34.4 02/23/2020 0853   RDW 12.9 02/23/2020 0853  LYMPHSABS 1.7 02/23/2020 0853   EOSABS 0.3 02/23/2020 0853   BASOSABS 0.0 02/23/2020 0853    No results found for: POCLITH, LITHIUM   No results found for: PHENYTOIN, PHENOBARB, VALPROATE, CBMZ   .res Assessment: Plan:    Plan:  1. Continue Trintellix 20mg  daily 2. Continue Buspar 10mg  TID 3. Continue Adderall XR 20mg  capsule BID    BP 131/87/77  Patient advised to contact office with any questions, adverse effects, or acute worsening in signs and symptoms.  Discussed potential benefits, risks, and side effects of stimulants with patient to include increased heart rate, palpitations, insomnia, increased anxiety, increased irritability, or decreased appetite.  Instructed patient to contact office if experiencing any significant tolerability issues.  RTC 3 months    Diagnoses and all orders for this visit:  Generalized anxiety disorder  Attention deficit hyperactivity disorder (ADHD), combined type -     amphetamine-dextroamphetamine (ADDERALL XR) 20 MG 24 hr capsule; Take 1 capsule (20 mg total) by mouth 2 (two) times daily. -     amphetamine-dextroamphetamine (ADDERALL XR) 20 MG 24 hr capsule; Take 1 capsule (20 mg total) by mouth 2 (two) times daily. -     amphetamine-dextroamphetamine (ADDERALL XR) 20 MG 24 hr capsule; Take 1 capsule (20 mg total) by mouth 2 (two) times daily.  Depression, unspecified depression type     Please see After Visit Summary for patient specific instructions.  Future Appointments  Date Time Provider  Department Center  12/13/2020  3:40 PM Hezekiah Veltre, , NP CP-CP None    No orders of the defined types were placed in this encounter.   -------------------------------

## 2020-10-29 ENCOUNTER — Telehealth: Payer: Self-pay

## 2020-10-29 NOTE — Telephone Encounter (Signed)
Prior Authorization submitted and approved for ADDERALL XR 20 MG effective 10/05/2020-10/05/2021 with [facility], PA# [id number]

## 2020-12-13 ENCOUNTER — Ambulatory Visit (INDEPENDENT_AMBULATORY_CARE_PROVIDER_SITE_OTHER): Payer: 59 | Admitting: Adult Health

## 2020-12-13 ENCOUNTER — Encounter: Payer: Self-pay | Admitting: Adult Health

## 2020-12-13 ENCOUNTER — Other Ambulatory Visit: Payer: Self-pay

## 2020-12-13 DIAGNOSIS — F411 Generalized anxiety disorder: Secondary | ICD-10-CM | POA: Diagnosis not present

## 2020-12-13 DIAGNOSIS — F902 Attention-deficit hyperactivity disorder, combined type: Secondary | ICD-10-CM | POA: Diagnosis not present

## 2020-12-13 DIAGNOSIS — F32A Depression, unspecified: Secondary | ICD-10-CM

## 2020-12-13 MED ORDER — AMPHETAMINE-DEXTROAMPHET ER 20 MG PO CP24: 20.0000 mg | ORAL_CAPSULE | Freq: Two times a day (BID) | ORAL | 0 refills | Status: DC

## 2020-12-13 MED ORDER — BUPROPION HCL ER (XL) 150 MG PO TB24: ORAL_TABLET | ORAL | 1 refills | Status: DC

## 2020-12-13 MED ORDER — BUSPIRONE HCL 10 MG PO TABS: 10.0000 mg | ORAL_TABLET | Freq: Three times a day (TID) | ORAL | 3 refills | Status: DC

## 2020-12-13 NOTE — Progress Notes (Signed)
James Arellano [id number] [birth year] 60 y.o.  Subjective:   Patient ID:  James Arellano is a 60 y.o. (DOB [birth year]) male.  Chief Complaint: No chief complaint on file.   HPI James Arellano presents to the office today for follow-up of anxiety, depression, and ADHD.  Describes mood today as "ok". Pleasant. Mood symptoms - reports "some" depression. Denies anxiety and irritability. Stating "things are going ok". Would like to try the Wellbutrin to manage depressive symptoms. He and significant other doing well. Stable interest and motivation. Taking medications as prescribed.  Energy levels stable. Active, has a regular exercise routine.  Enjoys some usual interests and activities. Lives with significant other of 10 years - has 2 adult children. One son at [employer]. Daughter works as a Runner, broadcasting/film/video.  Appetite adequate. Weight loss - 40 pounds. Sleeps well most nights. Averages 8 hours. Focus and concentration stable with Adderall. Completing tasks. Managing aspects of household. Work going well. Works for [employer]. Denies SI or HI.  Denies AH or VH.    PHQ2-9   Flowsheet Row Office Visit from 02/15/2020 in [facility] Office Visit from 10/12/2018 in [facility]  PHQ-2 Total Score 0 0  PHQ-9 Total Score 0 0       Review of Systems:  Review of Systems  Musculoskeletal: Negative for gait problem.  Neurological: Negative for tremors.  Psychiatric/Behavioral:       Please refer to HPI    Medications: I have reviewed the patient's current medications.  Current Outpatient Medications  Medication Sig Dispense Refill  . buPROPion (WELLBUTRIN XL) 150 MG 24 hr tablet Take one tablet daily x 7 days, then takes two tablets daily. 180 tablet 1  . amLODipine (NORVASC) 5 MG tablet TAKE 1 TABLET BY MOUTH  DAILY 90 tablet 1  . amphetamine-dextroamphetamine (ADDERALL XR) 20 MG 24 hr capsule Take 1 capsule (20 mg total) by mouth 2 (two) times daily. 60  capsule 0  . [START ON 01/10/2021] amphetamine-dextroamphetamine (ADDERALL XR) 20 MG 24 hr capsule Take 1 capsule (20 mg total) by mouth 2 (two) times daily. 60 capsule 0  . [START ON 02/07/2021] amphetamine-dextroamphetamine (ADDERALL XR) 20 MG 24 hr capsule Take 1 capsule (20 mg total) by mouth 2 (two) times daily. 60 capsule 0  . busPIRone (BUSPAR) 10 MG tablet Take 1 tablet (10 mg total) by mouth 3 (three) times daily. 270 tablet 3  . lisinopril (ZESTRIL) 40 MG tablet TAKE 1 TABLET BY MOUTH  DAILY 90 tablet 3  . omeprazole (PRILOSEC) 40 MG capsule Take 40 mg by mouth 2 (two) times daily.      Current Facility-Administered Medications  Medication Dose Route Frequency Provider Last Rate Last Admin  . diphenoxylate-atropine (LOMOTIL) 2.5-0.025 MG per tablet 1 tablet  1 tablet Oral QID PRN Maple Hudson., MD      . diphenoxylate-atropine (LOMOTIL) 2.5-0.025 MG per tablet 1 tablet  1 tablet Oral QID PRN Maple Hudson., MD      . promethazine [facility]) tablet 25 mg  25 mg Oral Once Maple Hudson., MD      . promethazine [facility]) tablet 25 mg  25 mg Oral Q6H PRN Maple Hudson., MD        Medication Side Effects: None  Allergies: No Known Allergies  Past Medical History:  Diagnosis Date  . Depression     Family History  Problem Relation Age of Onset  . Dementia Mother   .  Heart disease Father   . Heart disease Brother 30's  . Heart disease Brother 23's  . Peptic Ulcer Disease Brother        removal of part if his esophagus.  . GER disease Brother     Social History   Socioeconomic History  . Marital status: Single    Spouse name: single  . Number of children: 2  . Years of education: 30  . Highest education level: Not on file  Occupational History  . Occupation: employeed with [employer]  Tobacco Use  . Smoking status: Never Smoker  . Smokeless tobacco: Never Used  Substance and Sexual Activity  . Alcohol use: Yes     Comment: wine ususally on the weekend  . Drug use: No  . Sexual activity: Not on file  Other Topics Concern  . Not on file  Social History Narrative  . Not on file   Social Determinants of Health   Financial Resource Strain: Not on file  Food Insecurity: Not on file  Transportation Needs: Not on file  Physical Activity: Not on file  Stress: Not on file  Social Connections: Not on file  Intimate Partner Violence: Not on file    Past Medical History, Surgical history, Social history, and Family history were reviewed and updated as appropriate.   Please see review of systems for further details on the patient's review from today.   Objective:   Physical Exam:  There were no vitals taken for this visit.  Physical Exam Constitutional:      General: He is not in acute distress. Musculoskeletal:        General: No deformity.  Neurological:     Mental Status: He is alert and oriented to person, place, and time.     Coordination: Coordination normal.  Psychiatric:        Attention and Perception: Attention and perception normal. He does not perceive auditory or visual hallucinations.        Mood and Affect: Mood normal. Mood is not anxious or depressed. Affect is not labile, blunt, angry or inappropriate.        Speech: Speech normal.        Behavior: Behavior normal.        Thought Content: Thought content normal. Thought content is not paranoid or delusional. Thought content does not include homicidal or suicidal ideation. Thought content does not include homicidal or suicidal plan.        Cognition and Memory: Cognition and memory normal.        Judgment: Judgment normal.     Comments: Insight intact     Lab Review:     Component Value Date/Time   NA 139 02/23/2020 0853   K 4.3 02/23/2020 0853   CL 100 02/23/2020 0853   CO2 22 02/23/2020 0853   GLUCOSE 97 02/23/2020 0853   BUN 18 02/23/2020 0853   CREATININE 1.16 02/23/2020 0853   CALCIUM 9.5 02/23/2020 0853    PROT 6.9 05/17/2020 0753   ALBUMIN 4.4 05/17/2020 0753   AST 18 05/17/2020 0753   ALT 18 05/17/2020 0753   ALKPHOS 83 05/17/2020 0753   BILITOT 0.5 05/17/2020 0753   GFRNONAA 70 02/23/2020 0853   GFRAA 80 02/23/2020 0853       Component Value Date/Time   WBC 7.3 02/23/2020 0853   RBC 4.54 02/23/2020 0853   HGB 14.1 02/23/2020 0853   HCT 41.0 02/23/2020 0853   PLT 290 02/23/2020 0853   MCV  90 02/23/2020 0853   MCH 31.1 02/23/2020 0853   MCHC 34.4 02/23/2020 0853   RDW 12.9 02/23/2020 0853   LYMPHSABS 1.7 02/23/2020 0853   EOSABS 0.3 02/23/2020 0853   BASOSABS 0.0 02/23/2020 0853    No results found for: POCLITH, LITHIUM   No results found for: PHENYTOIN, PHENOBARB, VALPROATE, CBMZ   .res Assessment: Plan:    Plan:  1. Continue Celexa 20mg  daily 2. Continue Buspar 10mg  TID 3. Continue Adderall XR 20mg  capsule BID   4. Add Wellbutrin XL 150mg  daily x 7 days, then increase to 300mg  daily - denies seizure history.   BP 124/81 - 65  Patient advised to contact office with any questions, adverse effects, or acute worsening in signs and symptoms.  Discussed potential benefits, risks, and side effects of stimulants with patient to include increased heart rate, palpitations, insomnia, increased anxiety, increased irritability, or decreased appetite.  Instructed patient to contact office if experiencing any significant tolerability issues.  RTC 3 months   Diagnoses and all orders for this visit:  Depression, unspecified depression type -     buPROPion (WELLBUTRIN XL) 150 MG 24 hr tablet; Take one tablet daily x 7 days, then takes two tablets daily.  Attention deficit hyperactivity disorder (ADHD), combined type -     amphetamine-dextroamphetamine (ADDERALL XR) 20 MG 24 hr capsule; Take 1 capsule (20 mg total) by mouth 2 (two) times daily. -     amphetamine-dextroamphetamine (ADDERALL XR) 20 MG 24 hr capsule; Take 1 capsule (20 mg total) by mouth 2 (two) times daily. -      amphetamine-dextroamphetamine (ADDERALL XR) 20 MG 24 hr capsule; Take 1 capsule (20 mg total) by mouth 2 (two) times daily.  Generalized anxiety disorder -     busPIRone (BUSPAR) 10 MG tablet; Take 1 tablet (10 mg total) by mouth 3 (three) times daily.     Please see After Visit Summary for patient specific instructions.  No future appointments.  No orders of the defined types were placed in this encounter.   -------------------------------

## 2021-03-14 ENCOUNTER — Other Ambulatory Visit: Payer: Self-pay

## 2021-03-14 ENCOUNTER — Encounter: Payer: Self-pay | Admitting: Adult Health

## 2021-03-14 ENCOUNTER — Ambulatory Visit (INDEPENDENT_AMBULATORY_CARE_PROVIDER_SITE_OTHER): Payer: 59 | Admitting: Adult Health

## 2021-03-14 DIAGNOSIS — F411 Generalized anxiety disorder: Secondary | ICD-10-CM

## 2021-03-14 DIAGNOSIS — F32A Depression, unspecified: Secondary | ICD-10-CM

## 2021-03-14 DIAGNOSIS — F902 Attention-deficit hyperactivity disorder, combined type: Secondary | ICD-10-CM

## 2021-03-14 MED ORDER — AMPHETAMINE-DEXTROAMPHET ER 20 MG PO CP24: 20.0000 mg | ORAL_CAPSULE | Freq: Two times a day (BID) | ORAL | 0 refills | Status: DC

## 2021-03-14 MED ORDER — CITALOPRAM HYDROBROMIDE 20 MG PO TABS: 40.0000 mg | ORAL_TABLET | Freq: Every day | ORAL | 3 refills | Status: DC

## 2021-03-14 MED ORDER — BUPROPION HCL ER (XL) 150 MG PO TB24: ORAL_TABLET | ORAL | 3 refills | Status: DC

## 2021-03-14 NOTE — Progress Notes (Signed)
James Arellano [id number] [birth year] 60 y.o.  Subjective:   Patient ID:  James Arellano is a 60 y.o. (DOB 1961-10-13) male.  Chief Complaint: No chief complaint on file.   HPI James Arellano presents to the office today for follow-up of anxiety, depression, and ADHD.  Describes mood today as "ok". Pleasant. Mood symptoms - denies depression, anxiety and irritability. Stating "I'm doing really well". Feels like addition of Wellbutrin has been helpful. Children doing well. He and significant other doing well. Stable interest and motivation. Taking medications as prescribed.  Energy levels stable. Active, has a regular exercise routine.  Enjoys some usual interests and activities. Lives with significant. 2 adult children.  Appetite adequate. Weight stable. Sleeps well most nights. Averages 8 hours. Focus and concentration stable with Adderall. Completing tasks. Managing aspects of household. Work going well. Works for [employer]. Denies SI or HI.  Denies AH or VH.    PHQ2-9   Flowsheet Row Office Visit from 02/15/2020 in [facility] Office Visit from 10/12/2018 in [facility]  PHQ-2 Total Score 0 0  PHQ-9 Total Score 0 0       Review of Systems:  Review of Systems  Musculoskeletal: Negative for gait problem.  Neurological: Negative for tremors.  Psychiatric/Behavioral:       Please refer to HPI    Medications: I have reviewed the patient's current medications.  Current Outpatient Medications  Medication Sig Dispense Refill  . citalopram (CELEXA) 20 MG tablet Take 2 tablets (40 mg total) by mouth daily. 90 tablet 3  . amLODipine (NORVASC) 5 MG tablet TAKE 1 TABLET BY MOUTH  DAILY 90 tablet 1  . amphetamine-dextroamphetamine (ADDERALL XR) 20 MG 24 hr capsule Take 1 capsule (20 mg total) by mouth 2 (two) times daily. 60 capsule 0  . [START ON 04/11/2021] amphetamine-dextroamphetamine (ADDERALL XR) 20 MG 24 hr capsule Take 1 capsule  (20 mg total) by mouth 2 (two) times daily. 60 capsule 0  . [START ON 05/09/2021] amphetamine-dextroamphetamine (ADDERALL XR) 20 MG 24 hr capsule Take 1 capsule (20 mg total) by mouth 2 (two) times daily. 60 capsule 0  . buPROPion (WELLBUTRIN XL) 150 MG 24 hr tablet Take two tablets daily. 180 tablet 3  . busPIRone (BUSPAR) 10 MG tablet Take 1 tablet (10 mg total) by mouth 3 (three) times daily. 270 tablet 3  . lisinopril (ZESTRIL) 40 MG tablet TAKE 1 TABLET BY MOUTH  DAILY 90 tablet 3  . omeprazole (PRILOSEC) 40 MG capsule Take 40 mg by mouth 2 (two) times daily.      Current Facility-Administered Medications  Medication Dose Route Frequency Provider Last Rate Last Admin  . diphenoxylate-atropine (LOMOTIL) 2.5-0.025 MG per tablet 1 tablet  1 tablet Oral QID PRN Maple Hudson., MD      . diphenoxylate-atropine (LOMOTIL) 2.5-0.025 MG per tablet 1 tablet  1 tablet Oral QID PRN Maple Hudson., MD      . promethazine [facility]) tablet 25 mg  25 mg Oral Once Maple Hudson., MD      . promethazine [facility]) tablet 25 mg  25 mg Oral Q6H PRN Maple Hudson., MD        Medication Side Effects: None  Allergies: No Known Allergies  Past Medical History:  Diagnosis Date  . Depression     Past Medical History, Surgical history, Social history, and Family history were reviewed and updated as appropriate.   Please see review of  systems for further details on the patient's review from today.   Objective:   Physical Exam:  There were no vitals taken for this visit.  Physical Exam Constitutional:      General: He is not in acute distress. Musculoskeletal:        General: No deformity.  Neurological:     Mental Status: He is alert and oriented to person, place, and time.     Coordination: Coordination normal.  Psychiatric:        Attention and Perception: Attention and perception normal. He does not perceive auditory or visual hallucinations.        Mood and  Affect: Mood normal. Mood is not anxious or depressed. Affect is not labile, blunt, angry or inappropriate.        Speech: Speech normal.        Behavior: Behavior normal.        Thought Content: Thought content normal. Thought content is not paranoid or delusional. Thought content does not include homicidal or suicidal ideation. Thought content does not include homicidal or suicidal plan.        Cognition and Memory: Cognition and memory normal.        Judgment: Judgment normal.     Comments: Insight intact     Lab Review:     Component Value Date/Time   NA 139 02/23/2020 0853   K 4.3 02/23/2020 0853   CL 100 02/23/2020 0853   CO2 22 02/23/2020 0853   GLUCOSE 97 02/23/2020 0853   BUN 18 02/23/2020 0853   CREATININE 1.16 02/23/2020 0853   CALCIUM 9.5 02/23/2020 0853   PROT 6.9 05/17/2020 0753   ALBUMIN 4.4 05/17/2020 0753   AST 18 05/17/2020 0753   ALT 18 05/17/2020 0753   ALKPHOS 83 05/17/2020 0753   BILITOT 0.5 05/17/2020 0753   GFRNONAA 70 02/23/2020 0853   GFRAA 80 02/23/2020 0853       Component Value Date/Time   WBC 7.3 02/23/2020 0853   RBC 4.54 02/23/2020 0853   HGB 14.1 02/23/2020 0853   HCT 41.0 02/23/2020 0853   PLT 290 02/23/2020 0853   MCV 90 02/23/2020 0853   MCH 31.1 02/23/2020 0853   MCHC 34.4 02/23/2020 0853   RDW 12.9 02/23/2020 0853   LYMPHSABS 1.7 02/23/2020 0853   EOSABS 0.3 02/23/2020 0853   BASOSABS 0.0 02/23/2020 0853    No results found for: POCLITH, LITHIUM   No results found for: PHENYTOIN, PHENOBARB, VALPROATE, CBMZ   .res Assessment: Plan:     Plan:  1. Continue Celexa 20mg  daily 2. Continue Buspar 10mg  TID 3. Continue Adderall XR 20mg  capsule BID   4. Wellbutrin XL 300mg  daily - denies seizure history.   BP 127/80 -67  Patient advised to contact office with any questions, adverse effects, or acute worsening in signs and symptoms.  Discussed potential benefits, risks, and side effects of stimulants with patient to include  increased heart rate, palpitations, insomnia, increased anxiety, increased irritability, or decreased appetite.  Instructed patient to contact office if experiencing any significant tolerability issues.  RTC 3 months    Diagnoses and all orders for this visit:  Generalized anxiety disorder  Depression, unspecified depression type -     buPROPion (WELLBUTRIN XL) 150 MG 24 hr tablet; Take two tablets daily. -     citalopram (CELEXA) 20 MG tablet; Take 2 tablets (40 mg total) by mouth daily.  Attention deficit hyperactivity disorder (ADHD), combined type -     amphetamine-dextroamphetamine (  ADDERALL XR) 20 MG 24 hr capsule; Take 1 capsule (20 mg total) by mouth 2 (two) times daily. -     amphetamine-dextroamphetamine (ADDERALL XR) 20 MG 24 hr capsule; Take 1 capsule (20 mg total) by mouth 2 (two) times daily. -     amphetamine-dextroamphetamine (ADDERALL XR) 20 MG 24 hr capsule; Take 1 capsule (20 mg total) by mouth 2 (two) times daily.     Please see After Visit Summary for patient specific instructions.  Future Appointments  Date Time Provider Department Center  06/12/2021  3:40 PM Huma Imhoff, Thereasa Solo, NP CP-CP None    No orders of the defined types were placed in this encounter.   -------------------------------

## 2021-06-06 ENCOUNTER — Other Ambulatory Visit: Payer: Self-pay

## 2021-06-06 DIAGNOSIS — F902 Attention-deficit hyperactivity disorder, combined type: Secondary | ICD-10-CM

## 2021-06-06 MED ORDER — AMPHETAMINE-DEXTROAMPHET ER 20 MG PO CP24: 20.0000 mg | ORAL_CAPSULE | Freq: Two times a day (BID) | ORAL | 0 refills | Status: DC

## 2021-06-12 ENCOUNTER — Ambulatory Visit: Payer: 59 | Admitting: Adult Health

## 2021-06-27 ENCOUNTER — Other Ambulatory Visit: Payer: Self-pay | Admitting: Adult Health

## 2021-06-27 DIAGNOSIS — F32A Depression, unspecified: Secondary | ICD-10-CM

## 2021-08-07 ENCOUNTER — Encounter: Payer: Self-pay | Admitting: Adult Health

## 2021-08-07 ENCOUNTER — Ambulatory Visit: Payer: 59 | Admitting: Adult Health

## 2021-08-07 ENCOUNTER — Other Ambulatory Visit: Payer: Self-pay

## 2021-08-07 DIAGNOSIS — F411 Generalized anxiety disorder: Secondary | ICD-10-CM | POA: Diagnosis not present

## 2021-08-07 DIAGNOSIS — F32A Depression, unspecified: Secondary | ICD-10-CM

## 2021-08-07 DIAGNOSIS — G4700 Insomnia, unspecified: Secondary | ICD-10-CM | POA: Diagnosis not present

## 2021-08-07 DIAGNOSIS — F902 Attention-deficit hyperactivity disorder, combined type: Secondary | ICD-10-CM

## 2021-08-07 MED ORDER — AMPHETAMINE-DEXTROAMPHET ER 20 MG PO CP24: 20.0000 mg | ORAL_CAPSULE | Freq: Two times a day (BID) | ORAL | 0 refills | Status: DC

## 2021-08-07 MED ORDER — TRAZODONE HCL 50 MG PO TABS: ORAL_TABLET | ORAL | 2 refills | Status: DC

## 2021-08-07 MED ORDER — CITALOPRAM HYDROBROMIDE 20 MG PO TABS: 40.0000 mg | ORAL_TABLET | Freq: Every day | ORAL | 3 refills | Status: DC

## 2021-08-07 MED ORDER — BUSPIRONE HCL 10 MG PO TABS: 10.0000 mg | ORAL_TABLET | Freq: Three times a day (TID) | ORAL | 3 refills | Status: DC

## 2021-08-07 NOTE — Progress Notes (Signed)
James Arellano [id number] 1961/02/15 60 y.o.  Subjective:   Patient ID:  James Arellano is a 60 y.o. (DOB [birth year]) male.  Chief Complaint: No chief complaint on file.   HPI James Arellano presents to the office today for follow-up of anxiety, depression, insomnia and ADHD.  Describes mood today as "ok". Pleasant. Mood symptoms - denies depression, anxiety and irritability. Stating "I'm doing ok". He and partner of 11 years recently ended their relationship and he will be moving out. Feels like medications continue to work well. Has been struggling with sleep and is willing to try a sleep aid. Children doing well - son is a Holiday representative at [employer]. Stable interest and motivation. Taking medications as prescribed. Energy levels stable. Active, has a regular exercise routine.  Enjoys some usual interests and activities. Recently single. 2 adult children.  Appetite adequate. Weight loss - intentional. Sleeps better some nights than others. Averages 2 to 3 hours - broken sleep throughout the night. Focus and concentration stable with Adderall. Completing tasks. Managing aspects of household. Work going well. Works for [employer]. Denies SI or HI.  Denies AH or VH.   PHQ2-9    Flowsheet Row Office Visit from 02/15/2020 in [facility] Office Visit from 10/12/2018 in [facility]  PHQ-2 Total Score 0 0  PHQ-9 Total Score 0 0        Review of Systems:  Review of Systems  Musculoskeletal:  Negative for gait problem.  Neurological:  Negative for tremors.  Psychiatric/Behavioral:         Please refer to HPI   Medications: I have reviewed the patient's current medications.  Current Outpatient Medications  Medication Sig Dispense Refill   traZODone (DESYREL) 50 MG tablet Take one to two tablets at bedtime for sleep. 60 tablet 2   amLODipine (NORVASC) 5 MG tablet TAKE 1 TABLET BY MOUTH  DAILY 90 tablet 1   amphetamine-dextroamphetamine (ADDERALL  XR) 20 MG 24 hr capsule Take 1 capsule (20 mg total) by mouth 2 (two) times daily. 60 capsule 0   [START ON 09/04/2021] amphetamine-dextroamphetamine (ADDERALL XR) 20 MG 24 hr capsule Take 1 capsule (20 mg total) by mouth 2 (two) times daily. 60 capsule 0   [START ON 10/02/2021] amphetamine-dextroamphetamine (ADDERALL XR) 20 MG 24 hr capsule Take 1 capsule (20 mg total) by mouth 2 (two) times daily. 60 capsule 0   buPROPion (WELLBUTRIN XL) 150 MG 24 hr tablet Take two tablets daily. 180 tablet 3   busPIRone (BUSPAR) 10 MG tablet Take 1 tablet (10 mg total) by mouth 3 (three) times daily. 270 tablet 3   citalopram (CELEXA) 20 MG tablet Take 2 tablets (40 mg total) by mouth daily. 180 tablet 3   lisinopril (ZESTRIL) 40 MG tablet TAKE 1 TABLET BY MOUTH  DAILY 90 tablet 3   omeprazole (PRILOSEC) 40 MG capsule Take 40 mg by mouth 2 (two) times daily.      Current Facility-Administered Medications  Medication Dose Route Frequency Provider Last Rate Last Admin   diphenoxylate-atropine (LOMOTIL) 2.5-0.025 MG per tablet 1 tablet  1 tablet Oral QID PRN Maple Hudson., MD       diphenoxylate-atropine (LOMOTIL) 2.5-0.025 MG per tablet 1 tablet  1 tablet Oral QID PRN Maple Hudson., MD       promethazine [facility]) tablet 25 mg  25 mg Oral Once Maple Hudson., MD       promethazine [facility]) tablet 25 mg  25 mg Oral Q6H PRN Maple Hudson., MD        Medication Side Effects: None  Allergies: No Known Allergies  Past Medical History:  Diagnosis Date   Depression     Past Medical History, Surgical history, Social history, and Family history were reviewed and updated as appropriate.   Please see review of systems for further details on the patient's review from today.   Objective:   Physical Exam:  There were no vitals taken for this visit.  Physical Exam Constitutional:      General: He is not in acute distress. Musculoskeletal:        General: No deformity.   Neurological:     Mental Status: He is alert and oriented to person, place, and time.     Coordination: Coordination normal.  Psychiatric:        Attention and Perception: Attention and perception normal. He does not perceive auditory or visual hallucinations.        Mood and Affect: Mood normal. Mood is not anxious or depressed. Affect is not labile, blunt, angry or inappropriate.        Speech: Speech normal.        Behavior: Behavior normal.        Thought Content: Thought content normal. Thought content is not paranoid or delusional. Thought content does not include homicidal or suicidal ideation. Thought content does not include homicidal or suicidal plan.        Cognition and Memory: Cognition and memory normal.        Judgment: Judgment normal.     Comments: Insight intact    Lab Review:     Component Value Date/Time   NA 139 02/23/2020 0853   K 4.3 02/23/2020 0853   CL 100 02/23/2020 0853   CO2 22 02/23/2020 0853   GLUCOSE 97 02/23/2020 0853   BUN 18 02/23/2020 0853   CREATININE 1.16 02/23/2020 0853   CALCIUM 9.5 02/23/2020 0853   PROT 6.9 05/17/2020 0753   ALBUMIN 4.4 05/17/2020 0753   AST 18 05/17/2020 0753   ALT 18 05/17/2020 0753   ALKPHOS 83 05/17/2020 0753   BILITOT 0.5 05/17/2020 0753   GFRNONAA 70 02/23/2020 0853   GFRAA 80 02/23/2020 0853       Component Value Date/Time   WBC 7.3 02/23/2020 0853   RBC 4.54 02/23/2020 0853   HGB 14.1 02/23/2020 0853   HCT 41.0 02/23/2020 0853   PLT 290 02/23/2020 0853   MCV 90 02/23/2020 0853   MCH 31.1 02/23/2020 0853   MCHC 34.4 02/23/2020 0853   RDW 12.9 02/23/2020 0853   LYMPHSABS 1.7 02/23/2020 0853   EOSABS 0.3 02/23/2020 0853   BASOSABS 0.0 02/23/2020 0853    No results found for: POCLITH, LITHIUM   No results found for: PHENYTOIN, PHENOBARB, VALPROATE, CBMZ   .res Assessment: Plan:    Plan:  Add:  Trazadone 50mg  - 1 to 2 at bedtime as needed for sleep  Continue:  Celexa 20mg  daily Buspar  10mg  TID Adderall XR 20mg  capsule BID   Wellbutrin XL 300mg  daily - denies seizure history.  BP 127/80 -66  Patient advised to contact office with any questions, adverse effects, or acute worsening in signs and symptoms.  Discussed potential benefits, risks, and side effects of stimulants with patient to include increased heart rate, palpitations, insomnia, increased anxiety, increased irritability, or decreased appetite.  Instructed patient to contact office if experiencing any significant tolerability issues.  RTC 3 months  Diagnoses and all orders for this visit:  Insomnia, unspecified type -     traZODone (DESYREL) 50 MG tablet; Take one to two tablets at bedtime for sleep.  Attention deficit hyperactivity disorder (ADHD), combined type -     amphetamine-dextroamphetamine (ADDERALL XR) 20 MG 24 hr capsule; Take 1 capsule (20 mg total) by mouth 2 (two) times daily. -     amphetamine-dextroamphetamine (ADDERALL XR) 20 MG 24 hr capsule; Take 1 capsule (20 mg total) by mouth 2 (two) times daily. -     amphetamine-dextroamphetamine (ADDERALL XR) 20 MG 24 hr capsule; Take 1 capsule (20 mg total) by mouth 2 (two) times daily.  Generalized anxiety disorder -     busPIRone (BUSPAR) 10 MG tablet; Take 1 tablet (10 mg total) by mouth 3 (three) times daily.  Depression, unspecified depression type -     citalopram (CELEXA) 20 MG tablet; Take 2 tablets (40 mg total) by mouth daily.    Please see After Visit Summary for patient specific instructions.  Future Appointments  Date Time Provider Department Center  11/07/2021  3:40 PM Alyanna Stoermer, Thereasa Solo, NP [facility] None    No orders of the defined types were placed in this encounter.   -------------------------------

## 2021-09-17 ENCOUNTER — Encounter: Payer: Self-pay | Admitting: Family Medicine

## 2021-09-25 ENCOUNTER — Other Ambulatory Visit: Payer: Self-pay

## 2021-09-25 ENCOUNTER — Encounter: Payer: Self-pay | Admitting: Family Medicine

## 2021-09-25 ENCOUNTER — Ambulatory Visit: Payer: 59 | Admitting: Family Medicine

## 2021-09-25 VITALS — BP 141/86 | HR 82 | Temp 97.8°F | Wt 188.0 lb

## 2021-09-25 DIAGNOSIS — Z125 Encounter for screening for malignant neoplasm of prostate: Secondary | ICD-10-CM

## 2021-09-25 DIAGNOSIS — Z13220 Encounter for screening for lipoid disorders: Secondary | ICD-10-CM

## 2021-09-25 DIAGNOSIS — R7989 Other specified abnormal findings of blood chemistry: Secondary | ICD-10-CM

## 2021-09-25 DIAGNOSIS — I10 Essential (primary) hypertension: Secondary | ICD-10-CM | POA: Diagnosis not present

## 2021-09-25 DIAGNOSIS — K2100 Gastro-esophageal reflux disease with esophagitis, without bleeding: Secondary | ICD-10-CM

## 2021-09-25 DIAGNOSIS — N5203 Combined arterial insufficiency and corporo-venous occlusive erectile dysfunction: Secondary | ICD-10-CM

## 2021-09-25 DIAGNOSIS — Z23 Encounter for immunization: Secondary | ICD-10-CM

## 2021-09-25 DIAGNOSIS — Z1329 Encounter for screening for other suspected endocrine disorder: Secondary | ICD-10-CM

## 2021-09-25 DIAGNOSIS — Z130 Encounter for screening for diseases of the blood and blood-forming organs and certain disorders involving the immune mechanism: Secondary | ICD-10-CM

## 2021-09-25 MED ORDER — SILDENAFIL CITRATE 50 MG PO TABS: 50.0000 mg | ORAL_TABLET | Freq: Every day | ORAL | 3 refills | Status: DC | PRN

## 2021-09-25 NOTE — Progress Notes (Signed)
Established Patient Office Visit  Subjective:  Patient ID: James Arellano, male    DOB: October 12, 1961  Age: 60 y.o. MRN: [id number]    HPI James Arellano presents for RX for Viagra stated no other issues. He has had successful weight loss with a keto diet. He sees Dr. Kallie Locks for his psychiatric illnesses.'s are stable. He is allergic to ACE inhibitors which caused his throat to itch.    Past Medical History:  Diagnosis Date   Depression     Past Surgical History:  Procedure Laterality Date   ESOPHAGOGASTRODUODENOSCOPY ENDOSCOPY  2015    Family History  Problem Relation Age of Onset   Dementia Mother    Heart disease Father    Heart disease Brother 67's   Heart disease Brother 70's   Peptic Ulcer Disease Brother        removal of part if his esophagus.   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  Tobacco Use   Smoking status: Never   Smokeless tobacco: Never  Substance and Sexual Activity   Alcohol use: Yes    Comment: wine ususally on the weekend   Drug use: No   Sexual activity: Not on file  Other Topics Concern   Not on file  Social History Narrative   Not on file   Social Determinants of Health   Financial Resource Strain: Not on file  Food Insecurity: Not on file  Transportation Needs: Not on file  Physical Activity: Not on file  Stress: Not on file  Social Connections: Not on file  Intimate Partner Violence: Not on file    Outpatient Medications Prior to Visit  Medication Sig Dispense Refill   amLODipine (NORVASC) 5 MG tablet TAKE 1 TABLET BY MOUTH  DAILY 90 tablet 1   amphetamine-dextroamphetamine (ADDERALL XR) 20 MG 24 hr capsule Take 1 capsule (20 mg total) by mouth 2 (two) times daily. 60  capsule 0   amphetamine-dextroamphetamine (ADDERALL XR) 20 MG 24 hr capsule Take 1 capsule (20 mg total) by mouth 2 (two) times daily. 60 capsule 0   [START ON 10/02/2021] amphetamine-dextroamphetamine (ADDERALL XR) 20 MG 24 hr capsule Take 1 capsule (20 mg total) by mouth 2 (two) times daily. 60 capsule 0   buPROPion (WELLBUTRIN XL) 150 MG 24 hr tablet Take two tablets daily. 180 tablet 3   busPIRone (BUSPAR) 10 MG tablet Take 1 tablet (10 mg total) by mouth 3 (three) times daily. 270 tablet 3   citalopram (CELEXA) 20 MG tablet Take 2 tablets (40 mg total) by mouth daily. 180 tablet 3   lisinopril (ZESTRIL) 40 MG tablet TAKE 1 TABLET BY MOUTH  DAILY 90 tablet 3   traZODone (DESYREL) 50 MG tablet Take one to two tablets at bedtime for sleep. 60 tablet 2   omeprazole (PRILOSEC) 40 MG capsule Take 40 mg by mouth 2 (two) times daily.  (Patient not taking: Reported on 09/25/2021)     Facility-Administered Medications Prior to Visit  Medication Dose Route Frequency Provider Last Rate Last Admin   diphenoxylate-atropine (LOMOTIL) 2.5-0.025 MG per tablet 1 tablet  1 tablet Oral QID PRN Maple Hudson., MD       diphenoxylate-atropine (LOMOTIL) 2.5-0.025 MG  per tablet 1 tablet  1 tablet Oral QID PRN Maple Hudson., MD       promethazine [facility]) tablet 25 mg  25 mg Oral Once Maple Hudson., MD       promethazine [facility]) tablet 25 mg  25 mg Oral Q6H PRN Maple Hudson., MD        No Known Allergies  ROS Review of Systems    Objective:    Physical Exam Vitals reviewed.  Constitutional:      Appearance: He is well-developed.  HENT:     Head: Normocephalic and atraumatic.     Right Ear: External ear normal.     Left Ear: External ear normal.     Nose: Nose normal.  Eyes:     Conjunctiva/sclera: Conjunctivae normal.     Pupils: Pupils are equal, round, and reactive to light.  Cardiovascular:     Rate and Rhythm: Normal rate and regular rhythm.     Heart  sounds: Normal heart sounds.  Pulmonary:     Effort: Pulmonary effort is normal.     Breath sounds: Normal breath sounds.  Abdominal:     General: Bowel sounds are normal.     Palpations: Abdomen is soft.  Musculoskeletal:     Cervical back: Normal range of motion and neck supple.  Skin:    General: Skin is warm and dry.  Neurological:     General: No focal deficit present.     Mental Status: He is alert and oriented to person, place, and time.  Psychiatric:        Mood and Affect: Mood normal.        Behavior: Behavior normal.        Thought Content: Thought content normal.        Judgment: Judgment normal.    BP (!) 141/86 (BP Location: Right Arm, Patient Position: Sitting, Cuff Size: Normal)   Pulse 82   Temp 97.8 F (36.6 C) (Oral)   Wt 188 lb (85.3 kg)   SpO2 100%   BMI 25.50 kg/m  Wt Readings from Last 3 Encounters:  09/25/21 188 lb (85.3 kg)  05/16/20 232 lb 6.4 oz (105.4 kg)  02/15/20 228 lb (103.4 kg)     Health Maintenance Due  Topic Date Due   HIV Screening  Never done   Hepatitis C Screening  Never done   COLONOSCOPY (Pts 45-41yrs Insurance coverage will need to be confirmed)  Never done   Zoster Vaccines- Shingrix (1 of 2) Never done   COVID-19 Vaccine (3 - Booster for Pfizer series) 03/13/2020    There are no preventive care reminders to display for this patient.  Lab Results  Component Value Date   TSH 1.370 02/23/2020   Lab Results  Component Value Date   WBC 7.3 02/23/2020   HGB 14.1 02/23/2020   HCT 41.0 02/23/2020   MCV 90 02/23/2020   PLT 290 02/23/2020   Lab Results  Component Value Date   NA 139 02/23/2020   K 4.3 02/23/2020   CO2 22 02/23/2020   GLUCOSE 97 02/23/2020   BUN 18 02/23/2020   CREATININE 1.16 02/23/2020   BILITOT 0.5 05/17/2020   ALKPHOS 83 05/17/2020   AST 18 05/17/2020   ALT 18 05/17/2020   PROT 6.9 05/17/2020   ALBUMIN 4.4 05/17/2020   CALCIUM 9.5 02/23/2020   Lab Results  Component Value Date   CHOL  205 (H) 02/23/2020   Lab Results  Component  Value Date   HDL 51 02/23/2020   Lab Results  Component Value Date   LDLCALC 141 (H) 02/23/2020   Lab Results  Component Value Date   TRIG 74 02/23/2020   Lab Results  Component Value Date   CHOLHDL 4.0 02/23/2020   No results found for: HGBA1C    Assessment & Plan:   Problem List Items Addressed This Visit     Essential hypertension - Primary   Relevant Medications   sildenafil (VIAGRA) 50 MG tablet   Other Relevant Orders   Lipid panel   TSH   CBC w/Diff/Platelet   Comprehensive Metabolic Panel (CMET)   GERD with esophagitis   Relevant Orders   Lipid panel   TSH   CBC w/Diff/Platelet   Comprehensive Metabolic Panel (CMET)   Other Visit Diagnoses     Elevated liver function tests       Relevant Orders   Lipid panel   TSH   CBC w/Diff/Platelet   Comprehensive Metabolic Panel (CMET)   Screening for thyroid disorder       Relevant Orders   Lipid panel   TSH   CBC w/Diff/Platelet   Comprehensive Metabolic Panel (CMET)   Screening for lipid disorders       Relevant Orders   Lipid panel   TSH   CBC w/Diff/Platelet   Comprehensive Metabolic Panel (CMET)   Encounter for screening for hematologic disorder       Relevant Orders   Lipid panel   TSH   CBC w/Diff/Platelet   Comprehensive Metabolic Panel (CMET)   Prostate cancer screening       Relevant Orders   PSA   Need for influenza vaccination       Relevant Orders   Flu Vaccine QUAD 13mo+IM (Fluarix, Fluzone & Alfiuria Quad PF) (Completed)       Meds ordered this encounter  Medications   sildenafil (VIAGRA) 50 MG tablet    Sig: Take 1 tablet (50 mg total) by mouth daily as needed for erectile dysfunction.    Dispense:  30 tablet    Refill:  3   1. Essential hypertension  - Lipid panel - TSH - CBC w/Diff/Platelet - Comprehensive Metabolic Panel (CMET)  2. ED Discussed risks and  benefits and will try Viagra 50 mg daily as needed #30 with 3  refills - Lipid panel - TSH - CBC w/Diff/Platelet - Comprehensive Metabolic Panel (CMET)  3. Gastroesophageal reflux disease with esophagitis, unspecified whether hemorrhage  - Lipid panel - TSH - CBC w/Diff/Platelet - Comprehensive Metabolic Panel (CMET)  4. Screening for thyroid disorder  - Lipid panel - TSH - CBC w/Diff/Platelet - Comprehensive Metabolic Panel (CMET)  5. Screening for lipid disorders  - Lipid panel - TSH - CBC w/Diff/Platelet - Comprehensive Metabolic Panel (CMET)  6. Encounter for screening for hematologic disorder  - Lipid panel - TSH - CBC w/Diff/Platelet - Comprehensive Metabolic Panel (CMET)  7. Prostate cancer screening  - PSA  8. Need for influenza vaccination  - Flu Vaccine QUAD 78mo+IM (Fluarix, Fluzone & Alfiuria Quad PF)   Follow-up: Return in about 14 weeks (around 01/01/2022).    April Hyacinth Meeker, [place]

## 2021-09-25 NOTE — Telephone Encounter (Signed)
How about appt with me 120 today? I have not seen him since May 2021.

## 2021-09-26 LAB — COMPREHENSIVE METABOLIC PANEL
ALT: 12 IU/L (ref 0–44)
AST: 13 IU/L (ref 0–40)
Albumin/Globulin Ratio: 2 (ref 1.2–2.2)
Albumin: 4.6 g/dL (ref 3.8–4.9)
Alkaline Phosphatase: 60 IU/L (ref 44–121)
BUN/Creatinine Ratio: 10 (ref 9–20)
BUN: 11 mg/dL (ref 6–24)
Bilirubin Total: 0.3 mg/dL (ref 0.0–1.2)
CO2: 26 mmol/L (ref 20–29)
Calcium: 9.3 mg/dL (ref 8.7–10.2)
Chloride: 97 mmol/L (ref 96–106)
Creatinine, Ser: 1.11 mg/dL (ref 0.76–1.27)
Globulin, Total: 2.3 g/dL (ref 1.5–4.5)
Glucose: 97 mg/dL (ref 70–99)
Potassium: 3.8 mmol/L (ref 3.5–5.2)
Sodium: 138 mmol/L (ref 134–144)
Total Protein: 6.9 g/dL (ref 6.0–8.5)
eGFR: 76 mL/min/{1.73_m2} (ref >59)

## 2021-09-26 LAB — LIPID PANEL
Chol/HDL Ratio: 4.5 ratio (ref 0.0–5.0)
Cholesterol, Total: 242 mg/dL — ABNORMAL HIGH (ref 100–199)
HDL: 54 mg/dL (ref >39)
LDL Chol Calc (NIH): 175 mg/dL — ABNORMAL HIGH (ref 0–99)
Triglycerides: 76 mg/dL (ref 0–149)
VLDL Cholesterol Cal: 13 mg/dL (ref 5–40)

## 2021-09-26 LAB — CBC WITH DIFFERENTIAL/PLATELET
Basophils Absolute: 0 10*3/uL (ref 0.0–0.2)
Basos: 1 %
EOS (ABSOLUTE): 0.1 10*3/uL (ref 0.0–0.4)
Eos: 2 %
Hematocrit: 42.9 % (ref 37.5–51.0)
Hemoglobin: 14.2 g/dL (ref 13.0–17.7)
Immature Grans (Abs): 0 10*3/uL (ref 0.0–0.1)
Immature Granulocytes: 0 %
Lymphocytes Absolute: 1.7 10*3/uL (ref 0.7–3.1)
Lymphs: 23 %
MCH: 30.6 pg (ref 26.6–33.0)
MCHC: 33.1 g/dL (ref 31.5–35.7)
MCV: 93 fL (ref 79–97)
Monocytes Absolute: 0.5 10*3/uL (ref 0.1–0.9)
Monocytes: 6 %
Neutrophils Absolute: 5 10*3/uL (ref 1.4–7.0)
Neutrophils: 68 %
Platelets: 307 10*3/uL (ref 150–450)
RBC: 4.64 x10E6/uL (ref 4.14–5.80)
RDW: 12.7 % (ref 11.6–15.4)
WBC: 7.3 10*3/uL (ref 3.4–10.8)

## 2021-09-26 LAB — PSA: Prostate Specific Ag, Serum: 1 ng/mL (ref 0.0–4.0)

## 2021-09-26 LAB — TSH: TSH: 1.36 u[IU]/mL (ref 0.450–4.500)

## 2021-11-07 ENCOUNTER — Other Ambulatory Visit: Payer: Self-pay

## 2021-11-07 ENCOUNTER — Encounter: Payer: Self-pay | Admitting: Adult Health

## 2021-11-07 ENCOUNTER — Ambulatory Visit: Payer: 59 | Admitting: Adult Health

## 2021-11-07 DIAGNOSIS — F902 Attention-deficit hyperactivity disorder, combined type: Secondary | ICD-10-CM

## 2021-11-07 DIAGNOSIS — F32A Depression, unspecified: Secondary | ICD-10-CM | POA: Diagnosis not present

## 2021-11-07 DIAGNOSIS — G4700 Insomnia, unspecified: Secondary | ICD-10-CM | POA: Diagnosis not present

## 2021-11-07 MED ORDER — AMPHETAMINE-DEXTROAMPHET ER 20 MG PO CP24: 20.0000 mg | ORAL_CAPSULE | Freq: Two times a day (BID) | ORAL | 0 refills | Status: DC

## 2021-11-07 MED ORDER — BUPROPION HCL ER (XL) 150 MG PO TB24: ORAL_TABLET | ORAL | 3 refills | Status: DC

## 2021-11-07 NOTE — Progress Notes (Signed)
James BeardsStephen E Arellano [id number] Jun 22, 1961 61 y.o.  Subjective:   Patient ID:  James BeardsStephen E Arellano is a [age] y.o. (DOB Jun 22, 1961) male.  Chief Complaint: No chief complaint on file.   HPI James BeardsStephen E Arellano presents to the office today for follow-up of anxiety, depression, insomnia and ADHD.  Describes mood today as "ok". Pleasant. Mood symptoms - denies depression, anxiety and irritability. Stating "I'm doing ok". Feels like medications continue to work well. Children doing well - son is a Holiday representativesenior at [employer]. Stable interest and motivation. Taking medications as prescribed. Energy levels stable. Active, has a regular exercise routine.  Enjoys some usual interests and activities. Single. Dating. 2 adult children.  Appetite adequate. Weight stable. Sleeps better some nights than others. Averages 8 hours. Focus and concentration stable with Adderall. Completing tasks. Managing aspects of household. Work going well. Works for [employer]. Denies SI or HI.  Denies AH or VH.   PHQ2-9    Flowsheet Row [facility]  PHQ-2 Total Score 0 0  PHQ-9 Total Score 0 0        Review of Systems:  Review of Systems  Musculoskeletal:  Negative for gait problem.  Neurological:  Negative for tremors.  Psychiatric/Behavioral:         Please refer to HPI   Medications: I have reviewed the patient's current medications.  Current Outpatient Medications  Medication Sig Dispense Refill   amLODipine (NORVASC) 5 MG tablet TAKE 1 TABLET BY MOUTH  DAILY 90 tablet 1   amphetamine-dextroamphetamine (ADDERALL XR) 20 MG 24 hr capsule Take 1 capsule (20 mg total) by mouth 2 (two) times daily. 60 capsule 0   [START ON 12/05/2021] amphetamine-dextroamphetamine (ADDERALL XR) 20 MG 24 hr capsule Take 1 capsule (20 mg total) by mouth 2 (two) times daily. 60 capsule 0   [START ON 01/02/2022]  amphetamine-dextroamphetamine (ADDERALL XR) 20 MG 24 hr capsule Take 1 capsule (20 mg total) by mouth 2 (two) times daily. 60 capsule 0   buPROPion (WELLBUTRIN XL) 150 MG 24 hr tablet Take three tablets daily. 270 tablet 3   busPIRone (BUSPAR) 10 MG tablet Take 1 tablet (10 mg total) by mouth 3 (three) times daily. 270 tablet 3   citalopram (CELEXA) 20 MG tablet Take 2 tablets (40 mg total) by mouth daily. 180 tablet 3   lisinopril (ZESTRIL) 40 MG tablet TAKE 1 TABLET BY MOUTH  DAILY 90 tablet 3   omeprazole (PRILOSEC) 40 MG capsule Take 40 mg by mouth 2 (two) times daily.  (Patient not taking: Reported on 09/25/2021)     sildenafil (VIAGRA) 50 MG tablet Take 1 tablet (50 mg total) by mouth daily as needed for erectile dysfunction. 30 tablet 3   traZODone (DESYREL) 50 MG tablet Take one to two tablets at bedtime for sleep. 60 tablet 2   Current Facility-Administered Medications  Medication Dose Route Frequency Provider Last Rate Last Admin   diphenoxylate-atropine (LOMOTIL) 2.5-0.025 MG per tablet 1 tablet  1 tablet Oral QID PRN Maple HudsonGilbert, Richard L Jr., MD       diphenoxylate-atropine (LOMOTIL) 2.5-0.025 MG per tablet 1 tablet  1 tablet Oral QID PRN Maple HudsonGilbert, Richard L Jr., MD       promethazine [facility](PHENERGAN) tablet 25 mg  25 mg Oral Once Maple HudsonGilbert, Richard L Jr., MD       promethazine [facility](PHENERGAN) tablet 25 mg  25 mg Oral Q6H PRN Maple HudsonGilbert, Richard L Jr.,  MD        Medication Side Effects: None  Allergies: No Known Allergies  Past Medical History:  Diagnosis Date   Depression     Past Medical History, Surgical history, Social history, and Family history were reviewed and updated as appropriate.   Please see review of systems for further details on the patient's review from today.   Objective:   Physical Exam:  There were no vitals taken for this visit.  Physical Exam Constitutional:      General: He is not in acute distress. Musculoskeletal:        General: No deformity.   Neurological:     Mental Status: He is alert and oriented to person, place, and time.     Coordination: Coordination normal.  Psychiatric:        Attention and Perception: Attention and perception normal. He does not perceive auditory or visual hallucinations.        Mood and Affect: Mood normal. Mood is not anxious or depressed. Affect is not labile, blunt, angry or inappropriate.        Speech: Speech normal.        Behavior: Behavior normal.        Thought Content: Thought content normal. Thought content is not paranoid or delusional. Thought content does not include homicidal or suicidal ideation. Thought content does not include homicidal or suicidal plan.        Cognition and Memory: Cognition and memory normal.        Judgment: Judgment normal.     Comments: Insight intact    Lab Review:     Component Value Date/Time   NA 138 09/25/2021 1344   K 3.8 09/25/2021 1344   CL 97 09/25/2021 1344   CO2 26 09/25/2021 1344   GLUCOSE 97 09/25/2021 1344   BUN 11 09/25/2021 1344   CREATININE 1.11 09/25/2021 1344   CALCIUM 9.3 09/25/2021 1344   PROT 6.9 09/25/2021 1344   ALBUMIN 4.6 09/25/2021 1344   AST 13 09/25/2021 1344   ALT 12 09/25/2021 1344   ALKPHOS 60 09/25/2021 1344   BILITOT 0.3 09/25/2021 1344   GFRNONAA 70 02/23/2020 0853   GFRAA 80 02/23/2020 0853       Component Value Date/Time   WBC 7.3 09/25/2021 1344   RBC 4.64 09/25/2021 1344   HGB 14.2 09/25/2021 1344   HCT 42.9 09/25/2021 1344   PLT 307 09/25/2021 1344   MCV 93 09/25/2021 1344   MCH 30.6 09/25/2021 1344   MCHC 33.1 09/25/2021 1344   RDW 12.7 09/25/2021 1344   LYMPHSABS 1.7 09/25/2021 1344   EOSABS 0.1 09/25/2021 1344   BASOSABS 0.0 09/25/2021 1344    No results found for: POCLITH, LITHIUM   No results found for: PHENYTOIN, PHENOBARB, VALPROATE, CBMZ   .res Assessment: Plan:    Plan:  Add:  Trazadone 50mg  - 1 to 2 at bedtime as needed for sleep  Continue:  Celexa 20mg  daily Buspar 10mg   TID Adderall XR 20mg  capsule BID   Wellbutrin XL 300mg  daily - denies seizure history.  BP 129/88/76  Patient advised to contact office with any questions, adverse effects, or acute worsening in signs and symptoms.  Discussed potential benefits, risks, and side effects of stimulants with patient to include increased heart rate, palpitations, insomnia, increased anxiety, increased irritability, or decreased appetite.  Instructed patient to contact office if experiencing any significant tolerability issues.  RTC 3 months  Diagnoses and all orders for this visit:  Depression,  unspecified depression type -     buPROPion (WELLBUTRIN XL) 150 MG 24 hr tablet; Take three tablets daily.  Attention deficit hyperactivity disorder (ADHD), combined type -     amphetamine-dextroamphetamine (ADDERALL XR) 20 MG 24 hr capsule; Take 1 capsule (20 mg total) by mouth 2 (two) times daily. -     amphetamine-dextroamphetamine (ADDERALL XR) 20 MG 24 hr capsule; Take 1 capsule (20 mg total) by mouth 2 (two) times daily. -     amphetamine-dextroamphetamine (ADDERALL XR) 20 MG 24 hr capsule; Take 1 capsule (20 mg total) by mouth 2 (two) times daily.  Insomnia, unspecified type     Please see After Visit Summary for patient specific instructions.  Future Appointments  Date Time Provider Department Center  01/01/2022  1:00 PM Maple Hudson., MD [id number]    No orders of the defined types were placed in this encounter.   -------------------------------

## 2021-12-03 ENCOUNTER — Other Ambulatory Visit: Payer: Self-pay | Admitting: Family Medicine

## 2021-12-03 DIAGNOSIS — I10 Essential (primary) hypertension: Secondary | ICD-10-CM

## 2021-12-03 NOTE — Telephone Encounter (Signed)
Requested Prescriptions  Pending Prescriptions Disp Refills   amLODipine (NORVASC) 5 MG tablet [Pharmacy Med Name: amLODIPine Besylate 5 MG Oral Tablet] 90 tablet 0    Sig: TAKE 1 TABLET BY MOUTH  DAILY     Cardiovascular:  Calcium Channel Blockers Failed - 12/03/2021  9:14 AM      Failed - Last BP in normal range    BP Readings from Last 1 Encounters:  09/25/21 (!) 141/86         Passed - Valid encounter within last 6 months    Recent Outpatient Visits          2 months ago Essential hypertension   [facility] Jerrol Banana., MD   1 year ago Essential hypertension   [facility] Jerrol Banana., MD   1 year ago Annual physical exam   [facility]   2 years ago Chiggers (mites)   [facility] Jerrol Banana., MD   2 years ago Benign hypertension   [facility] Jerrol Banana., MD      Future Appointments            In 4 weeks Jerrol Banana., MD [facility] [place]

## 2021-12-31 NOTE — Progress Notes (Signed)
James Arellano,acting as a scribe for Wilhemena Durie, MD.,have documented all relevant documentation on the behalf of Wilhemena Durie, MD,as directed by  Wilhemena Durie, MD while in the presence of Wilhemena Durie, MD.    Complete physical exam   Patient: James Arellano   DOB: 1961/01/30   61 y.o. Male  MRN: [id number] Visit Date: 01/01/2022  Today's healthcare provider: Wilhemena Durie, MD   No chief complaint on file.  Subjective    James Arellano is a 61 y.o. male who presents today for a complete physical exam.  He reports consuming a general diet. Cardio exercise sometimes.  He generally feels well. He reports sleeping well. He does not have additional problems to discuss today.  HPI    Past Medical History:  Diagnosis Date   Depression    Past Surgical History:  Procedure Laterality Date   ESOPHAGOGASTRODUODENOSCOPY ENDOSCOPY  2015   Social History   Socioeconomic History   Marital status: Single    Spouse name: single   Number of children: 2   Years of education: 16   Highest education level: Not on file  Occupational History   Occupation: employeed with [employer]  Tobacco Use   Smoking status: Never   Smokeless tobacco: Never  Substance and Sexual Activity   Alcohol use: Yes    Comment: wine ususally on the weekend   Drug use: No   Sexual activity: Not on file  Other Topics Concern   Not on file  Social History Narrative   Not on file   Social Determinants of Health   Financial Resource Strain: Not on file  Food Insecurity: Not on file  Transportation Needs: Not on file  Physical Activity: Not on file  Stress: Not on file  Social Connections: Not on file  Intimate Partner Violence: Not on file   Family Status  Relation Name Status   Mother  Deceased at age 61       Due to natural causes   Father  Deceased at age 37       cause of death MI   Sister Purdy Deceased at age 61's       Cause of death- Kidney failure, alcoholism   Brother English as a second language teacher Alive   Brother Insurance account manager   Family History  Problem Relation Age of Onset   Dementia Mother    Heart disease Father    Heart disease Brother 30's   Heart disease Brother 43's   Peptic Ulcer Disease Brother        removal of part if his esophagus.   GER disease Brother    No Known Allergies  Patient Care Team: Jerrol Banana., MD as PCP - General (Family Medicine)   Medications: Outpatient Medications Prior to Visit  Medication Sig   amLODipine (NORVASC) 5 MG tablet TAKE 1 TABLET BY MOUTH  DAILY   amphetamine-dextroamphetamine (ADDERALL XR) 20 MG 24 hr capsule Take 1 capsule (20 mg total) by mouth 2 (two) times daily.   amphetamine-dextroamphetamine (ADDERALL XR) 20 MG 24 hr capsule Take 1 capsule (20 mg total) by mouth 2 (two) times daily.   [START ON 01/02/2022] amphetamine-dextroamphetamine (ADDERALL XR) 20 MG 24 hr capsule Take 1 capsule (20 mg total) by mouth 2 (two) times daily.   buPROPion (WELLBUTRIN XL)  150 MG 24 hr tablet Take three tablets daily.   busPIRone (BUSPAR) 10 MG tablet Take 1 tablet (10 mg total) by mouth 3 (three) times daily.   citalopram (CELEXA) 20 MG tablet Take 2 tablets (40 mg total) by mouth daily.   lisinopril (ZESTRIL) 40 MG tablet TAKE 1 TABLET BY MOUTH  DAILY   omeprazole (PRILOSEC) 40 MG capsule Take 40 mg by mouth 2 (two) times daily.   sildenafil (VIAGRA) 50 MG tablet Take 1 tablet (50 mg total) by mouth daily as needed for erectile dysfunction.   traZODone (DESYREL) 50 MG tablet Take one to two tablets at bedtime for sleep.   Facility-Administered Medications Prior to Visit  Medication Dose Route Frequency Provider   diphenoxylate-atropine (LOMOTIL) 2.5-0.025 MG per tablet 1 tablet  1 tablet Oral QID PRN Jerrol Banana., MD   diphenoxylate-atropine (LOMOTIL)  2.5-0.025 MG per tablet 1 tablet  1 tablet Oral QID PRN Jerrol Banana., MD   promethazine [facility]) tablet 25 mg  25 mg Oral Once Jerrol Banana., MD   promethazine [facility]) tablet 25 mg  25 mg Oral Q6H PRN Jerrol Banana., MD    Review of Systems  Constitutional: Negative.   Eyes: Negative.   Respiratory: Negative.    Cardiovascular: Negative.   Gastrointestinal: Negative.   Endocrine: Negative.   Genitourinary: Negative.   Musculoskeletal: Negative.   Skin: Negative.   Allergic/Immunologic: Negative.   Neurological: Negative.   Hematological: Negative.   Psychiatric/Behavioral: Negative.        Objective    BP 129/88 (BP Location: Right Arm, Patient Position: Sitting, Cuff Size: Large)    Pulse 74    Temp 98.4 F (36.9 C) (Oral)    Ht 6' (1.829 m)    Wt 212 lb (96.2 kg)    SpO2 97%    BMI 28.75 kg/m      Physical Exam Constitutional:      Appearance: Normal appearance. He is normal weight.  HENT:     Head: Normocephalic and atraumatic.     Right Ear: Tympanic membrane, ear canal and external ear normal.     Left Ear: Tympanic membrane, ear canal and external ear normal.     Nose: Nose normal.     Mouth/Throat:     Mouth: Mucous membranes are moist.     Pharynx: Oropharynx is clear.  Eyes:     Extraocular Movements: Extraocular movements intact.     Conjunctiva/sclera: Conjunctivae normal.     Pupils: Pupils are equal, round, and reactive to light.  Cardiovascular:     Rate and Rhythm: Normal rate and regular rhythm.     Pulses: Normal pulses.     Heart sounds: Normal heart sounds.  Pulmonary:     Effort: Pulmonary effort is normal.     Breath sounds: Normal breath sounds.  Abdominal:     General: Abdomen is flat. Bowel sounds are normal.     Palpations: Abdomen is soft.  Musculoskeletal:     Cervical back: Normal range of motion and neck supple.  Skin:    General: Skin is warm and dry.  Neurological:     General: No focal deficit  present.     Mental Status: He is alert and oriented to person, place, and time. Mental status is at baseline.  Psychiatric:        Mood and Affect: Mood normal.        Behavior: Behavior normal.  Thought Content: Thought content normal.        Judgment: Judgment normal.     Last depression screening scores PHQ 2/9 Scores 01/01/2022 02/15/2020 10/12/2018  PHQ - 2 Score 0 0 0  PHQ- 9 Score 0 0 0   Last fall risk screening Fall Risk  01/01/2022  Falls in the past year? 0   Last Audit-C alcohol use screening Alcohol Use Disorder Test (AUDIT) 01/01/2022  1. How often do you have a drink containing alcohol? 2  2. How many drinks containing alcohol do you have on a typical day when you are drinking? 0  3. How often do you have six or more drinks on one occasion? 0  AUDIT-C Score 2   A score of 3 or more in women, and 4 or more in men indicates increased risk for alcohol abuse, EXCEPT if all of the points are from question 1   No results found for any visits on 01/01/22.  Assessment & Plan    Routine Health Maintenance and Physical Exam  Exercise Activities and Dietary recommendations  Goals   None     Immunization History  Administered Date(s) Administered   Influenza,inj,Quad PF,6+ Mos 10/12/2018, 09/25/2021   Influenza-Unspecified 08/02/2019   MMR 06/08/1995   PFIZER(Purple Top)SARS-COV-2 Vaccination 12/23/2019, 01/17/2020   Td 03/19/1995   Tdap 02/21/2016    Health Maintenance  Topic Date Due   HIV Screening  Never done   Hepatitis C Screening  Never done   COLONOSCOPY (Pts 45-47yr Insurance coverage will need to be confirmed)  Never done   Zoster Vaccines- Shingrix (1 of 2) Never done   COVID-19 Vaccine (3 - Booster for Pfizer series) 03/13/2020   TETANUS/TDAP  02/20/2026   INFLUENZA VACCINE  Completed   HPV VACCINES  Aged Out    Discussed health benefits of physical activity, and encouraged him to engage in regular exercise appropriate for his age and  condition.  1. Annual physical exam  - POCT urinalysis dipstick  2. Colon cancer screening 2012 with last colonoscopy. - Ambulatory referral to Gastroenterology   No follow-ups on file.     I, RWilhemena Durie MD, have reviewed all documentation for this visit. The documentation on 01/03/22 for the exam, diagnosis, procedures, and orders are all accurate and complete.    Edwyn Inclan GCranford Mon MD  [phone](phone) [phone](fax)  [place]

## 2022-01-01 ENCOUNTER — Other Ambulatory Visit: Payer: Self-pay

## 2022-01-01 ENCOUNTER — Ambulatory Visit (INDEPENDENT_AMBULATORY_CARE_PROVIDER_SITE_OTHER): Payer: 59 | Admitting: Family Medicine

## 2022-01-01 VITALS — BP 129/88 | HR 74 | Temp 98.4°F | Ht 72.0 in | Wt 212.0 lb

## 2022-01-01 DIAGNOSIS — Z1211 Encounter for screening for malignant neoplasm of colon: Secondary | ICD-10-CM

## 2022-01-01 DIAGNOSIS — Z0000 Encounter for general adult medical examination without abnormal findings: Secondary | ICD-10-CM

## 2022-01-01 LAB — POCT URINALYSIS DIPSTICK
Bilirubin, UA: NEGATIVE
Blood, UA: NEGATIVE
Glucose, UA: NEGATIVE
Ketones, UA: NEGATIVE
Leukocytes, UA: NEGATIVE
Nitrite, UA: NEGATIVE
Protein, UA: NEGATIVE
Spec Grav, UA: <=1.005 — AB (ref 1.010–1.025)
Urobilinogen, UA: 0.2 E.U./dL
pH, UA: 5 (ref 5.0–8.0)

## 2022-02-05 ENCOUNTER — Ambulatory Visit: Payer: 59 | Admitting: Adult Health

## 2022-02-05 ENCOUNTER — Encounter: Payer: Self-pay | Admitting: Adult Health

## 2022-02-05 DIAGNOSIS — F411 Generalized anxiety disorder: Secondary | ICD-10-CM | POA: Diagnosis not present

## 2022-02-05 DIAGNOSIS — G4700 Insomnia, unspecified: Secondary | ICD-10-CM

## 2022-02-05 DIAGNOSIS — F32A Depression, unspecified: Secondary | ICD-10-CM | POA: Diagnosis not present

## 2022-02-05 DIAGNOSIS — F902 Attention-deficit hyperactivity disorder, combined type: Secondary | ICD-10-CM

## 2022-02-05 MED ORDER — VORTIOXETINE HBR 20 MG PO TABS: 20.0000 mg | ORAL_TABLET | Freq: Every day | ORAL | 1 refills | Status: DC

## 2022-02-05 NOTE — Progress Notes (Signed)
James Arellano ?[id number] ?August 21, 1961 ?61 y.o. ? ?Subjective:  ? ?Patient ID:  James Arellano is a 61 y.o. (DOB 29-Oct-1961) male. ? ?Chief Complaint: No chief complaint on file. ? ? ?HPI ?BRITTEN SEYFRIED presents to the office today for follow-up of anxiety, depression, insomnia and ADHD. ? ?Describes mood today as "ok". Pleasant. Denies tearfulness. Mood symptoms - denies depression, anxiety and irritability. Stating "I'm doing ok". Would like to switch from Celexa back to the Trintellix - feels like it worked better for him. Has not taken the Adderall - feels like Wellbutrin is a better fit for him. Feels like medications continue to work well overall. Children doing well. Stable interest and motivation. Taking medications as prescribed. ?Energy levels stable. Active, has a regular exercise routine.  ?Enjoys some usual interests and activities. Single. Dating. 2 adult children.  ?Appetite adequate. Weight stable. ?Sleeps better some nights than others. Averages 8 hours. ?Focus and concentration stable with Adderall. Completing tasks. Managing aspects of household. Work going well. Works for [employer]. ?Denies SI or HI.  ?Denies AH or VH. ? ? ?PHQ2-9   ? ?Flowsheet Row Office Visit from 01/01/2022 in [facility] Office Visit from 02/15/2020 in [facility] Office Visit from 10/12/2018 in [facility]  ?PHQ-2 Total Score 0 0 0  ?PHQ-9 Total Score 0 0 0  ? ?  ?  ? ?Review of Systems:  ?Review of Systems  ?Musculoskeletal:  Negative for gait problem.  ?Neurological:  Negative for tremors.  ?Psychiatric/Behavioral:    ?     Please refer to HPI  ? ?Medications: I have reviewed the patient's current medications. ? ?Current Outpatient Medications  ?Medication Sig Dispense Refill  ? amphetamine-dextroamphetamine (ADDERALL XR) 20 MG 24 hr capsule Take 1 capsule (20 mg total) by mouth 2 (two) times daily. 60 capsule 0  ? amphetamine-dextroamphetamine (ADDERALL XR) 20  MG 24 hr capsule Take 1 capsule (20 mg total) by mouth 2 (two) times daily. 60 capsule 0  ? amphetamine-dextroamphetamine (ADDERALL XR) 20 MG 24 hr capsule Take 1 capsule (20 mg total) by mouth 2 (two) times daily. 60 capsule 0  ? buPROPion (WELLBUTRIN XL) 150 MG 24 hr tablet Take three tablets daily. 270 tablet 3  ? busPIRone (BUSPAR) 10 MG tablet Take 1 tablet (10 mg total) by mouth 3 (three) times daily. 270 tablet 3  ? lisinopril (ZESTRIL) 40 MG tablet TAKE 1 TABLET BY MOUTH  DAILY 90 tablet 3  ? omeprazole (PRILOSEC) 40 MG capsule Take 40 mg by mouth 2 (two) times daily.    ? sildenafil (VIAGRA) 50 MG tablet Take 1 tablet (50 mg total) by mouth daily as needed for erectile dysfunction. 30 tablet 3  ? traZODone (DESYREL) 50 MG tablet Take one to two tablets at bedtime for sleep. 60 tablet 2  ? ?Current Facility-Administered Medications  ?Medication Dose Route Frequency Provider Last Rate Last Admin  ? diphenoxylate-atropine (LOMOTIL) 2.5-0.025 MG per tablet 1 tablet  1 tablet Oral QID PRN Maple Hudson., MD      ? diphenoxylate-atropine (LOMOTIL) 2.5-0.025 MG per tablet 1 tablet  1 tablet Oral QID PRN Maple Hudson., MD      ? promethazine [facility]) tablet 25 mg  25 mg Oral Once Maple Hudson., MD      ? promethazine [facility]) tablet 25 mg  25 mg Oral Q6H PRN Maple Hudson., MD      ? ? ?Medication Side  Effects: None ? ?Allergies: No Known Allergies ? ?Past Medical History:  ?Diagnosis Date  ? Depression   ? ? ?Past Medical History, Surgical history, Social history, and Family history were reviewed and updated as appropriate.  ? ?Please see review of systems for further details on the patient's review from today.  ? ?Objective:  ? ?Physical Exam:  ?There were no vitals taken for this visit. ? ?Physical Exam ?Constitutional:   ?   General: He is not in acute distress. ?Musculoskeletal:     ?   General: No deformity.  ?Neurological:  ?   Mental Status: He is alert and oriented  to person, place, and time.  ?   Coordination: Coordination normal.  ?Psychiatric:     ?   Attention and Perception: Attention and perception normal. He does not perceive auditory or visual hallucinations.     ?   Mood and Affect: Mood normal. Mood is not anxious or depressed. Affect is not labile, blunt, angry or inappropriate.     ?   Speech: Speech normal.     ?   Behavior: Behavior normal.     ?   Thought Content: Thought content normal. Thought content is not paranoid or delusional. Thought content does not include homicidal or suicidal ideation. Thought content does not include homicidal or suicidal plan.     ?   Cognition and Memory: Cognition and memory normal.     ?   Judgment: Judgment normal.  ?   Comments: Insight intact  ? ? ?Lab Review:  ?   ?Component Value Date/Time  ? NA 138 09/25/2021 1344  ? K 3.8 09/25/2021 1344  ? CL 97 09/25/2021 1344  ? CO2 26 09/25/2021 1344  ? GLUCOSE 97 09/25/2021 1344  ? BUN 11 09/25/2021 1344  ? CREATININE 1.11 09/25/2021 1344  ? CALCIUM 9.3 09/25/2021 1344  ? PROT 6.9 09/25/2021 1344  ? ALBUMIN 4.6 09/25/2021 1344  ? AST 13 09/25/2021 1344  ? ALT 12 09/25/2021 1344  ? ALKPHOS 60 09/25/2021 1344  ? BILITOT 0.3 09/25/2021 1344  ? GFRNONAA 70 02/23/2020 0853  ? GFRAA 80 02/23/2020 0853  ? ? ?   ?Component Value Date/Time  ? WBC 7.3 09/25/2021 1344  ? RBC 4.64 09/25/2021 1344  ? HGB 14.2 09/25/2021 1344  ? HCT 42.9 09/25/2021 1344  ? PLT 307 09/25/2021 1344  ? MCV 93 09/25/2021 1344  ? MCH 30.6 09/25/2021 1344  ? MCHC 33.1 09/25/2021 1344  ? RDW 12.7 09/25/2021 1344  ? LYMPHSABS 1.7 09/25/2021 1344  ? EOSABS 0.1 09/25/2021 1344  ? BASOSABS 0.0 09/25/2021 1344  ? ? ?No results found for: POCLITH, LITHIUM  ? ?No results found for: PHENYTOIN, PHENOBARB, VALPROATE, CBMZ  ? ?.res ?Assessment: Plan:   ? ?Plan: ? ?Continue: ? ?D/C Celexa 20mg  daily - take 1/2 tablet daily for 7 days then d/c ?Add Trintellix 20mg  daily - take 1/2 tablet daily for 7 days then increase to one tablet  daily ?Buspar 10mg  TID ?Wellbutrin XL 150mg  daily - 3 daily - denies seizure history ? ? ?Patient advised to contact office with any questions, adverse effects, or acute worsening in signs and symptoms. ? ?Discussed potential benefits, risks, and side effects of stimulants with patient to include increased heart rate, palpitations, insomnia, increased anxiety, increased irritability, or decreased appetite.  Instructed patient to contact office if experiencing any significant tolerability issues. ? ?RTC 3 months ? ?Diagnoses and all orders for this visit: ? ?Depression, unspecified depression  type ? ?Attention deficit hyperactivity disorder (ADHD), combined type ? ?Insomnia, unspecified type ? ?Generalized anxiety disorder ? ?  ? ?Please see After Visit Summary for patient specific instructions. ? ?Future Appointments  ?Date Time Provider Department Center  ?05/12/2022  1:40 PM Maple HudsonGilbert, Richard L Jr., MD [facility]  ? ? ?No orders of the defined types were placed in this encounter. ? ? ?------------------------------- ?

## 2022-02-08 ENCOUNTER — Other Ambulatory Visit: Payer: Self-pay | Admitting: Family Medicine

## 2022-02-08 DIAGNOSIS — I10 Essential (primary) hypertension: Secondary | ICD-10-CM

## 2022-02-10 NOTE — Telephone Encounter (Signed)
Requested medications are due for refill today.  unsure ? ?Requested medications are on the active medications list.  no ? ?Last refill. 12/03/2021 #90 0 refills ? ?Future visit scheduled.   yes ? ?Notes to clinic.  Medication was discontinued on 4/5 2023. ? ? ? ?Requested Prescriptions  ?Pending Prescriptions Disp Refills  ? amLODipine (NORVASC) 5 MG tablet [Pharmacy Med Name: amLODIPine Besylate 5 MG Oral Tablet] 90 tablet 3  ?  Sig: TAKE 1 TABLET BY MOUTH  DAILY  ?  ? Cardiovascular: Calcium Channel Blockers 2 Passed - 02/08/2022  6:34 AM  ?  ?  Passed - Last BP in normal range  ?  BP Readings from Last 1 Encounters:  ?01/01/22 129/88  ?  ?  ?  ?  Passed - Last Heart Rate in normal range  ?  Pulse Readings from Last 1 Encounters:  ?01/01/22 74  ?  ?  ?  ?  Passed - Valid encounter within last 6 months  ?  Recent Outpatient Visits   ? ?      ? 1 month ago Annual physical exam  ? [facility] Maple Hudson., MD  ? 4 months ago Essential hypertension  ? [facility] Maple Hudson., MD  ? 1 year ago Essential hypertension  ? [facility] Maple Hudson., MD  ? 1 year ago Annual physical exam  ? [facility] Maple Hudson., MD  ? 2 years ago Chiggers (mites)  ? [facility] Maple Hudson., MD  ? ?  ?  ?Future Appointments   ? ?        ? In 3 months Maple Hudson., MD [facility]  ? ?  ? ?  ?  ?  ?  ?

## 2022-05-02 ENCOUNTER — Encounter: Payer: Self-pay | Admitting: Family Medicine

## 2022-05-12 ENCOUNTER — Ambulatory Visit: Payer: 59 | Admitting: Family Medicine

## 2022-05-13 ENCOUNTER — Ambulatory Visit: Payer: 59 | Admitting: Adult Health

## 2022-05-13 ENCOUNTER — Encounter: Payer: Self-pay | Admitting: Adult Health

## 2022-05-13 DIAGNOSIS — F32A Depression, unspecified: Secondary | ICD-10-CM

## 2022-05-13 DIAGNOSIS — F411 Generalized anxiety disorder: Secondary | ICD-10-CM | POA: Diagnosis not present

## 2022-05-13 DIAGNOSIS — G4700 Insomnia, unspecified: Secondary | ICD-10-CM | POA: Diagnosis not present

## 2022-05-13 NOTE — Progress Notes (Signed)
MUREL Arellano [id number] 15-Dec-1960 61 y.o.  Subjective:   Patient ID:  James Arellano is a 60 y.o. (DOB 03/15/1961) male.  Chief Complaint: No chief complaint on file.   HPI GOHAN COLLISTER presents to the office today for follow-up of anxiety, depression and insomnia.  Describes mood today as "ok". Pleasant. Denies tearfulness. Mood symptoms - denies depression, anxiety and irritability. Mood is consistent. Stating "I'm doing ok". Feels like medications continue to work well overall. Children doing well. Stable interest and motivation. Taking medications as prescribed. Energy levels stable. Active, has a regular exercise routine.  Enjoys some usual interests and activities. Single. Dating. 2 adult children.  Appetite adequate. Weight stable. Sleeps better some nights than others. Averages 8 hours. Focus and concentration stable with Adderall. Completing tasks. Managing aspects of household. Work going well. Works for [employer]. Denies SI or HI.  Denies AH or VH.   PHQ2-9    Flowsheet Row Office Visit from 01/01/2022 in [facility] Office Visit from 02/15/2020 in [facility] Office Visit from 10/12/2018 in [facility]  PHQ-2 Total Score 0 0 0  PHQ-9 Total Score 0 0 0        Review of Systems:  Review of Systems  Musculoskeletal:  Negative for gait problem.  Neurological:  Negative for tremors.  Psychiatric/Behavioral:         Please refer to HPI    Medications: I have reviewed the patient's current medications.  Current Outpatient Medications  Medication Sig Dispense Refill   amLODipine (NORVASC) 5 MG tablet TAKE 1 TABLET BY MOUTH DAILY 90 tablet 3   buPROPion (WELLBUTRIN XL) 150 MG 24 hr tablet Take three tablets daily. 270 tablet 3   busPIRone (BUSPAR) 10 MG tablet Take 1 tablet (10 mg total) by mouth 3 (three) times daily. 270 tablet 3   lisinopril (ZESTRIL) 40 MG tablet TAKE 1 TABLET BY MOUTH  DAILY 90  tablet 3   omeprazole (PRILOSEC) 40 MG capsule Take 40 mg by mouth 2 (two) times daily.     sildenafil (VIAGRA) 50 MG tablet Take 1 tablet (50 mg total) by mouth daily as needed for erectile dysfunction. 30 tablet 3   vortioxetine HBr (TRINTELLIX) 20 MG TABS tablet Take 1 tablet (20 mg total) by mouth daily. 90 tablet 1   Current Facility-Administered Medications  Medication Dose Route Frequency Provider Last Rate Last Admin   diphenoxylate-atropine (LOMOTIL) 2.5-0.025 MG per tablet 1 tablet  1 tablet Oral QID PRN Maple Hudson., MD       diphenoxylate-atropine (LOMOTIL) 2.5-0.025 MG per tablet 1 tablet  1 tablet Oral QID PRN Maple Hudson., MD       promethazine [facility]) tablet 25 mg  25 mg Oral Once Maple Hudson., MD       promethazine [facility]) tablet 25 mg  25 mg Oral Q6H PRN Maple Hudson., MD        Medication Side Effects: None  Allergies: No Known Allergies  Past Medical History:  Diagnosis Date   Depression     Past Medical History, Surgical history, Social history, and Family history were reviewed and updated as appropriate.   Please see review of systems for further details on the patient's review from today.   Objective:   Physical Exam:  There were no vitals taken for this visit.  Physical Exam Constitutional:      General: He is not in acute distress. Musculoskeletal:  General: No deformity.  Neurological:     Mental Status: He is alert and oriented to person, place, and time.     Coordination: Coordination normal.  Psychiatric:        Attention and Perception: Attention and perception normal. He does not perceive auditory or visual hallucinations.        Mood and Affect: Mood normal. Mood is not anxious or depressed. Affect is not labile, blunt, angry or inappropriate.        Speech: Speech normal.        Behavior: Behavior normal.        Thought Content: Thought content normal. Thought content is not paranoid or  delusional. Thought content does not include homicidal or suicidal ideation. Thought content does not include homicidal or suicidal plan.        Cognition and Memory: Cognition and memory normal.        Judgment: Judgment normal.     Comments: Insight intact     Lab Review:     Component Value Date/Time   NA 138 09/25/2021 1344   K 3.8 09/25/2021 1344   CL 97 09/25/2021 1344   CO2 26 09/25/2021 1344   GLUCOSE 97 09/25/2021 1344   BUN 11 09/25/2021 1344   CREATININE 1.11 09/25/2021 1344   CALCIUM 9.3 09/25/2021 1344   PROT 6.9 09/25/2021 1344   ALBUMIN 4.6 09/25/2021 1344   AST 13 09/25/2021 1344   ALT 12 09/25/2021 1344   ALKPHOS 60 09/25/2021 1344   BILITOT 0.3 09/25/2021 1344   GFRNONAA 70 02/23/2020 0853   GFRAA 80 02/23/2020 0853       Component Value Date/Time   WBC 7.3 09/25/2021 1344   RBC 4.64 09/25/2021 1344   HGB 14.2 09/25/2021 1344   HCT 42.9 09/25/2021 1344   PLT 307 09/25/2021 1344   MCV 93 09/25/2021 1344   MCH 30.6 09/25/2021 1344   MCHC 33.1 09/25/2021 1344   RDW 12.7 09/25/2021 1344   LYMPHSABS 1.7 09/25/2021 1344   EOSABS 0.1 09/25/2021 1344   BASOSABS 0.0 09/25/2021 1344    No results found for: "POCLITH", "LITHIUM"   No results found for: "PHENYTOIN", "PHENOBARB", "VALPROATE", "CBMZ"   .res Assessment: Plan:    Plan:  Continue:  Trintellix 20mg  daily   Buspar 10mg  TID Wellbutrin XL 150mg  daily - 3 daily - denies seizure history  Patient advised to contact office with any questions, adverse effects, or acute worsening in signs and symptoms.  Discussed potential benefits, risks, and side effects of stimulants with patient to include increased heart rate, palpitations, insomnia, increased anxiety, increased irritability, or decreased appetite.  Instructed patient to contact office if experiencing any significant tolerability issues.  RTC 6 months.  Diagnoses and all orders for this visit:  Insomnia, unspecified type  Depression,  unspecified depression type  Generalized anxiety disorder     Please see After Visit Summary for patient specific instructions.  Future Appointments  Date Time Provider Department Center  07/03/2022  4:00 PM ., MD [facility]    No orders of the defined types were placed in this encounter.   -------------------------------

## 2022-05-14 ENCOUNTER — Ambulatory Visit: Payer: 59 | Admitting: Adult Health

## 2022-06-12 ENCOUNTER — Other Ambulatory Visit: Payer: Self-pay | Admitting: Adult Health

## 2022-07-02 ENCOUNTER — Ambulatory Visit: Admit: 2022-07-02 | Payer: Self-pay | Admitting: Internal Medicine

## 2022-07-02 SURGERY — EGD (ESOPHAGOGASTRODUODENOSCOPY)
Anesthesia: General

## 2022-07-03 ENCOUNTER — Ambulatory Visit: Payer: 59 | Admitting: Family Medicine

## 2022-07-23 ENCOUNTER — Other Ambulatory Visit: Payer: Self-pay | Admitting: Family Medicine

## 2022-08-21 ENCOUNTER — Other Ambulatory Visit: Payer: Self-pay | Admitting: Adult Health

## 2022-09-03 ENCOUNTER — Ambulatory Visit: Payer: 59 | Admitting: Family Medicine

## 2022-10-03 ENCOUNTER — Other Ambulatory Visit: Payer: Self-pay | Admitting: Adult Health

## 2022-10-03 DIAGNOSIS — F411 Generalized anxiety disorder: Secondary | ICD-10-CM

## 2022-10-04 ENCOUNTER — Other Ambulatory Visit: Payer: Self-pay | Admitting: Adult Health

## 2022-10-04 DIAGNOSIS — F32A Depression, unspecified: Secondary | ICD-10-CM

## 2022-11-13 ENCOUNTER — Ambulatory Visit: Payer: 59 | Admitting: Adult Health

## 2022-11-13 ENCOUNTER — Encounter: Payer: Self-pay | Admitting: Adult Health

## 2022-11-13 DIAGNOSIS — F32A Depression, unspecified: Secondary | ICD-10-CM

## 2022-11-13 DIAGNOSIS — F411 Generalized anxiety disorder: Secondary | ICD-10-CM | POA: Diagnosis not present

## 2022-11-13 DIAGNOSIS — G4700 Insomnia, unspecified: Secondary | ICD-10-CM

## 2022-11-13 MED ORDER — BUSPIRONE HCL 10 MG PO TABS: 10.0000 mg | ORAL_TABLET | Freq: Three times a day (TID) | ORAL | 3 refills | Status: DC

## 2022-11-13 MED ORDER — BUPROPION HCL ER (XL) 150 MG PO TB24: ORAL_TABLET | ORAL | 3 refills | Status: DC

## 2022-11-13 MED ORDER — VORTIOXETINE HBR 20 MG PO TABS: 20.0000 mg | ORAL_TABLET | Freq: Every day | ORAL | 3 refills | Status: DC

## 2022-11-13 NOTE — Progress Notes (Signed)
DOMENIK TRICE [id number] 1961/05/11 62 y.o.  Subjective:   Patient ID:  WALID HAIG is a 62 y.o. (DOB 05-Sep-1961) male.  Chief Complaint: No chief complaint on file.   HPI ALARIC GLADWIN presents to the office today for follow-up of anxiety, depression and insomnia.  Describes mood today as "ok". Pleasant. Denies tearfulness. Mood symptoms - denies depression, anxiety and irritability. Mood is consistent. Stating "I'm doing alright". Feels like medications continue to work well overall. Children doing well. Building a house. Stable interest and motivation. Taking medications as prescribed. Energy levels stable. Active, has a regular exercise routine.  Enjoys some usual interests and activities. Single. Dating. 2 adult children.  Appetite adequate. Weight gain. Sleeps better some nights than others. Averages 7 to 8 hours. Focus and concentration stable with Adderall. Completing tasks. Managing aspects of household. Work going well. Works for [employer] Denies SI or HI.  Denies AH or VH.    [facility] Office Visit from 01/01/2022 in [place] Visit from 02/15/2020 in [place] Visit from 10/12/2018 in [place]  PHQ-2 Total Score 0 0 0  PHQ-9 Total Score 0 0 0        Review of Systems:  Review of Systems  Musculoskeletal:  Negative for gait problem.  Neurological:  Negative for tremors.  Psychiatric/Behavioral:         Please refer to HPI    Medications: I have reviewed the patient's current medications.  Current Outpatient Medications  Medication Sig Dispense Refill   amLODipine (NORVASC) 5 MG tablet TAKE 1 TABLET BY MOUTH DAILY 90 tablet 3   buPROPion (WELLBUTRIN XL) 150 MG 24 hr tablet TAKE 3 TABLETS BY MOUTH DAILY 270 tablet 3   busPIRone (BUSPAR) 10 MG tablet Take 1 tablet (10 mg total) by mouth 3 (three) times daily. 270 tablet 3   lisinopril (ZESTRIL) 40 MG tablet  TAKE 1 TABLET BY MOUTH  DAILY 90 tablet 3   omeprazole (PRILOSEC) 40 MG capsule Take 40 mg by mouth 2 (two) times daily.     sildenafil (VIAGRA) 50 MG tablet TAKE ONE TABLET BY MOUTH ONE TIME DAILY AS NEEDED FOR ERECTILE DYSFUNCTION 85 tablet 0   vortioxetine HBr (TRINTELLIX) 20 MG TABS tablet Take 1 tablet (20 mg total) by mouth daily. 90 tablet 3   Current Facility-Administered Medications  Medication Dose Route Frequency Provider Last Rate Last Admin   diphenoxylate-atropine (LOMOTIL) 2.5-0.025 MG per tablet 1 tablet  1 tablet Oral QID PRN Jerrol Banana., MD       diphenoxylate-atropine (LOMOTIL) 2.5-0.025 MG per tablet 1 tablet  1 tablet Oral QID PRN Jerrol Banana., MD       promethazine [facility]) tablet 25 mg  25 mg Oral Once Jerrol Banana., MD       promethazine [facility]) tablet 25 mg  25 mg Oral Q6H PRN Jerrol Banana., MD        Medication Side Effects: None  Allergies: No Known Allergies  Past Medical History:  Diagnosis Date   Depression     Past Medical History, Surgical history, Social history, and Family history were reviewed and updated as appropriate.   Please see review of systems for further details on the patient's review from today.   Objective:   Physical Exam:  There were no vitals taken for this visit.  Physical Exam Constitutional:      General: He  is not in acute distress. Musculoskeletal:        General: No deformity.  Neurological:     Mental Status: He is alert and oriented to person, place, and time.     Coordination: Coordination normal.  Psychiatric:        Attention and Perception: Attention and perception normal. He does not perceive auditory or visual hallucinations.        Mood and Affect: Mood normal. Mood is not anxious or depressed. Affect is not labile, blunt, angry or inappropriate.        Speech: Speech normal.        Behavior: Behavior normal.        Thought Content: Thought content normal. Thought  content is not paranoid or delusional. Thought content does not include homicidal or suicidal ideation. Thought content does not include homicidal or suicidal plan.        Cognition and Memory: Cognition and memory normal.        Judgment: Judgment normal.     Comments: Insight intact     Lab Review:     Component Value Date/Time   NA 138 09/25/2021 1344   K 3.8 09/25/2021 1344   CL 97 09/25/2021 1344   CO2 26 09/25/2021 1344   GLUCOSE 97 09/25/2021 1344   BUN 11 09/25/2021 1344   CREATININE 1.11 09/25/2021 1344   CALCIUM 9.3 09/25/2021 1344   PROT 6.9 09/25/2021 1344   ALBUMIN 4.6 09/25/2021 1344   AST 13 09/25/2021 1344   ALT 12 09/25/2021 1344   ALKPHOS 60 09/25/2021 1344   BILITOT 0.3 09/25/2021 1344   GFRNONAA 70 02/23/2020 0853   GFRAA 80 02/23/2020 0853       Component Value Date/Time   WBC 7.3 09/25/2021 1344   RBC 4.64 09/25/2021 1344   HGB 14.2 09/25/2021 1344   HCT 42.9 09/25/2021 1344   PLT 307 09/25/2021 1344   MCV 93 09/25/2021 1344   MCH 30.6 09/25/2021 1344   MCHC 33.1 09/25/2021 1344   RDW 12.7 09/25/2021 1344   LYMPHSABS 1.7 09/25/2021 1344   EOSABS 0.1 09/25/2021 1344   BASOSABS 0.0 09/25/2021 1344    No results found for: "POCLITH", "LITHIUM"   No results found for: "PHENYTOIN", "PHENOBARB", "VALPROATE", "CBMZ"   .res Assessment: Plan:    Plan:  Continue:  Trintellix 20mg  daily   Buspar 10mg  TID Wellbutrin XL 150mg  daily - 3 daily - denies seizure history  Patient advised to contact office with any questions, adverse effects, or acute worsening in signs and symptoms.  Discussed potential benefits, risks, and side effects of stimulants with patient to include increased heart rate, palpitations, insomnia, increased anxiety, increased irritability, or decreased appetite.  Instructed patient to contact office if experiencing any significant tolerability issues.  RTC 6 months.  Diagnoses and all orders for this visit:  Generalized  anxiety disorder -     vortioxetine HBr (TRINTELLIX) 20 MG TABS tablet; Take 1 tablet (20 mg total) by mouth daily. -     busPIRone (BUSPAR) 10 MG tablet; Take 1 tablet (10 mg total) by mouth 3 (three) times daily.  Depression, unspecified depression type -     vortioxetine HBr (TRINTELLIX) 20 MG TABS tablet; Take 1 tablet (20 mg total) by mouth daily. -     buPROPion (WELLBUTRIN XL) 150 MG 24 hr tablet; TAKE 3 TABLETS BY MOUTH DAILY     Please see After Visit Summary for patient specific instructions.  No future appointments.  No  orders of the defined types were placed in this encounter.   -------------------------------

## 2022-12-12 ENCOUNTER — Encounter: Payer: Self-pay | Admitting: Orthopaedic Surgery

## 2022-12-12 ENCOUNTER — Ambulatory Visit (INDEPENDENT_AMBULATORY_CARE_PROVIDER_SITE_OTHER): Payer: 59

## 2022-12-12 ENCOUNTER — Ambulatory Visit (INDEPENDENT_AMBULATORY_CARE_PROVIDER_SITE_OTHER): Payer: 59 | Admitting: Orthopaedic Surgery

## 2022-12-12 DIAGNOSIS — M25511 Pain in right shoulder: Secondary | ICD-10-CM

## 2022-12-12 NOTE — Progress Notes (Signed)
Office Visit Note   Patient: James Arellano           Date of Birth: [birth year]           MRN: [id number] Visit Date: 12/12/2022              Requested by: Eulas Post, MD [address] [place],  [place] [address] PCP: Eulas Post, MD   Assessment & Plan: Visit Diagnoses:  1. Acute pain of right shoulder     Plan: Impression is long head biceps tendon rupture.  Disease process reviewed with the patient and treatment options were reviewed.  Patient is fairly active and would like to obtain an MRI to determine if biceps tenodesis is still an option.  Questions encouraged and answered.  I will order the MRI.  Will have him follow-up with me afterwards.  Follow-Up Instructions: No follow-ups on file.   Orders:  Orders Placed This Encounter  Procedures   XR Shoulder Right   MR SHOULDER RIGHT WO CONTRAST   No orders of the defined types were placed in this encounter.     Procedures: No procedures performed   Clinical Data: No additional findings.   Subjective: Chief Complaint  Patient presents with   Right Upper Arm - Pain    HPI  James Arellano is a 62 year old gentleman who comes in for right shoulder pain for about 4 to 5 weeks.  He was lifting weights when he felt pain in the proximal upper arm and shoulder region.  He developed bruising afterwards.  He has noticed a Popeye deformity.  Symptoms have improved overall.  Review of Systems  Constitutional: Negative.   HENT: Negative.    Eyes: Negative.   Respiratory: Negative.    Cardiovascular: Negative.   Gastrointestinal: Negative.   Endocrine: Negative.   Genitourinary: Negative.   Skin: Negative.   Allergic/Immunologic: Negative.   Neurological: Negative.   Hematological: Negative.   Psychiatric/Behavioral: Negative.    All other systems reviewed and are negative.    Objective: Vital Signs: There were no vitals taken for this visit.  Physical Exam Vitals and nursing note reviewed.   Constitutional:      Appearance: He is well-developed.  HENT:     Head: Normocephalic and atraumatic.  Eyes:     Pupils: Pupils are equal, round, and reactive to light.  Pulmonary:     Effort: Pulmonary effort is normal.  Abdominal:     Palpations: Abdomen is soft.  Musculoskeletal:        General: Normal range of motion.     Cervical back: Neck supple.  Skin:    General: Skin is warm.  Neurological:     Mental Status: He is alert and oriented to person, place, and time.  Psychiatric:        Behavior: Behavior normal.        Thought Content: Thought content normal.        Judgment: Judgment normal.     Ortho Exam Examination right shoulder shows full passive and active range of motion.  He has slight asymmetry of the proximal biceps muscle consistent with Popeye deformity.  Manual muscle testing of the rotator cuff is normal. Specialty Comments:  No specialty comments available.  Imaging: XR Shoulder Right  Result Date: 12/12/2022 Degenerative changes of the [facility] joint and downward sloping acromion.  Mild glenohumeral osteoarthritis.  No acute abnormalities.    PMFS History: Patient Active Problem List   Diagnosis Date Noted  GERD with esophagitis 12/15/2018   Personal history of other diseases of the digestive system 06/15/2015   History of Barrett's esophagus 06/15/2015   Barrett esophagus 04/09/2015   H/O: depression 04/09/2015   Family history of coronary arteriosclerosis 04/09/2015   Essential hypertension 04/09/2015   Depression 04/09/2015   Past Medical History:  Diagnosis Date   Depression     Family History  Problem Relation Age of Onset   Dementia Mother    Heart disease Father    Heart disease Brother 63's   Heart disease Brother 31's   Peptic Ulcer Disease Brother        removal of part if his esophagus.   GER disease Brother     Past Surgical History:  Procedure Laterality Date   ESOPHAGOGASTRODUODENOSCOPY ENDOSCOPY  2015   Social History    Occupational History   Occupation: employeed with [employer]  Tobacco Use   Smoking status: Never   Smokeless tobacco: Never  Substance and Sexual Activity   Alcohol use: Yes    Comment: wine ususally on the weekend   Drug use: No   Sexual activity: Not on file

## 2022-12-15 ENCOUNTER — Ambulatory Visit
Admission: RE | Admit: 2022-12-15 | Discharge: 2022-12-15 | Disposition: A | Discharge status: Home / Self Care | Payer: 59 | Source: Ambulatory Visit | Attending: Orthopaedic Surgery | Admitting: Orthopaedic Surgery

## 2022-12-15 DIAGNOSIS — M25511 Pain in right shoulder: Secondary | ICD-10-CM

## 2022-12-15 NOTE — Progress Notes (Signed)
Needs follow-up appointment please thank you

## 2022-12-18 ENCOUNTER — Ambulatory Visit: Payer: Self-pay | Admitting: Orthopaedic Surgery

## 2022-12-23 ENCOUNTER — Ambulatory Visit: Payer: 59 | Admitting: Orthopaedic Surgery

## 2022-12-23 ENCOUNTER — Encounter: Payer: Self-pay | Admitting: Orthopaedic Surgery

## 2022-12-23 DIAGNOSIS — M75111 Incomplete rotator cuff tear or rupture of right shoulder, not specified as traumatic: Secondary | ICD-10-CM | POA: Diagnosis not present

## 2022-12-23 NOTE — Progress Notes (Signed)
   Office Visit Note   Patient: James Arellano           Date of Birth: Jun 25, 1961           MRN: [id number] Visit Date: 12/23/2022              Requested by: Eulas Post, MD [address] [place],  [place] [address] PCP: Eulas Post, MD   Assessment & Plan: Visit Diagnoses:  1. Partial nontraumatic tear of rotator cuff, right     Plan: MRI is consistent with a ruptured long head biceps tendon with retraction.  Also there is incidental finding diffuse tendinopathy of the supraspinatus.  However he is not that symptomatic from this.  I recommend continuing with home exercises and conservative management.  Questions encouraged and answered.  Follow-up as needed.  Follow-Up Instructions: No follow-ups on file.   Orders:  No orders of the defined types were placed in this encounter.  No orders of the defined types were placed in this encounter.     Procedures: No procedures performed   Clinical Data: No additional findings.   Subjective: Chief Complaint  Patient presents with   Right Shoulder - Follow-up    MRI review    HPI  James Arellano returns today for discussion of right shoulder MRI scan.  No new symptoms.  Review of Systems   Objective: Vital Signs: There were no vitals taken for this visit.  Physical Exam  Ortho Exam  Examination of right shoulder is unchanged.  He has good strength with testing of the supraspinatus with minimal pain.  Specialty Comments:  No specialty comments available.  Imaging: No results found.   PMFS History: Patient Active Problem List   Diagnosis Date Noted   Partial nontraumatic tear of rotator cuff, right 12/23/2022   GERD with esophagitis 12/15/2018   Personal history of other diseases of the digestive system 06/15/2015   History of Barrett's esophagus 06/15/2015   Barrett esophagus 04/09/2015   H/O: depression 04/09/2015   Family history of coronary arteriosclerosis 04/09/2015   Essential  hypertension 04/09/2015   Depression 04/09/2015   Past Medical History:  Diagnosis Date   Depression     Family History  Problem Relation Age of Onset   Dementia Mother    Heart disease Father    Heart disease Brother 19's   Heart disease Brother 57's   Peptic Ulcer Disease Brother        removal of part if his esophagus.   GER disease Brother     Past Surgical History:  Procedure Laterality Date   ESOPHAGOGASTRODUODENOSCOPY ENDOSCOPY  2015   Social History   Occupational History   Occupation: employeed with [employer]  Tobacco Use   Smoking status: Never   Smokeless tobacco: Never  Substance and Sexual Activity   Alcohol use: Yes    Comment: wine ususally on the weekend   Drug use: No   Sexual activity: Not on file

## 2023-01-02 ENCOUNTER — Telehealth: Payer: Self-pay | Admitting: Family Medicine

## 2023-01-02 NOTE — Telephone Encounter (Signed)
[facility] faxed refill request for the following medications:   amLODipine (NORVASC) 5 MG tablet     Please advise.

## 2023-01-05 ENCOUNTER — Other Ambulatory Visit: Payer: Self-pay

## 2023-01-05 DIAGNOSIS — I10 Essential (primary) hypertension: Secondary | ICD-10-CM

## 2023-01-06 ENCOUNTER — Telehealth: Payer: Self-pay | Admitting: Family Medicine

## 2023-01-21 NOTE — Telephone Encounter (Signed)
OPENED IN ERROR

## 2023-05-14 ENCOUNTER — Encounter: Payer: Self-pay | Admitting: Adult Health

## 2023-05-14 ENCOUNTER — Ambulatory Visit (INDEPENDENT_AMBULATORY_CARE_PROVIDER_SITE_OTHER): Payer: 59 | Admitting: Adult Health

## 2023-05-14 DIAGNOSIS — G4700 Insomnia, unspecified: Secondary | ICD-10-CM | POA: Diagnosis not present

## 2023-05-14 DIAGNOSIS — F411 Generalized anxiety disorder: Secondary | ICD-10-CM | POA: Diagnosis not present

## 2023-05-14 DIAGNOSIS — F32A Depression, unspecified: Secondary | ICD-10-CM

## 2023-05-14 NOTE — Progress Notes (Signed)
James Arellano [id number] [birth year] 62 y.o.  Subjective:   Patient ID:  James Arellano is a 62 y.o. (DOB 10-16-1961) male.  Chief Complaint: No chief complaint on file.   HPI James Arellano presents to the office today for follow-up of anxiety, depression and insomnia.  Describes mood today as "ok". Pleasant. Denies tearfulness. Mood symptoms - denies depression, anxiety and irritability. Mood is consistent. Stating "I'm doing pretty good". Feels like medications continue to work well overall. Family doing well. Stable interest and motivation. Taking medications as prescribed. Energy levels stable. Active, has a regular exercise routine.  Enjoys some usual interests and activities. Lives with partner - has 2 adult children.  Appetite adequate. Weight stable.. Sleeps better some nights than others. Averages 7 to 8 hours. Focus and concentration stable with Adderall. Completing tasks. Managing aspects of household. Work going well. Works for [employer]. Denies SI or HI.  Denies AH or VH. Denies self harm. Denies substance use.  PHQ2-9    Flowsheet Row Office Visit from 01/01/2022 in [facility] Office Visit from 02/15/2020 in [facility] Office Visit from 10/12/2018 in [facility]  PHQ-2 Total Score 0 0 0  PHQ-9 Total Score 0 0 0        Review of Systems:  Review of Systems  Musculoskeletal:  Negative for gait problem.  Neurological:  Negative for tremors.  Psychiatric/Behavioral:         Please refer to HPI    Medications: I have reviewed the patient's current medications.  Current Outpatient Medications  Medication Sig Dispense Refill   amLODipine (NORVASC) 5 MG tablet TAKE 1 TABLET BY MOUTH DAILY 90 tablet 3   buPROPion (WELLBUTRIN XL) 150 MG 24 hr tablet TAKE 3 TABLETS BY MOUTH DAILY 270 tablet 3   busPIRone (BUSPAR) 10 MG tablet Take 1 tablet (10 mg total) by mouth 3  (three) times daily. 270 tablet 3   lisinopril (ZESTRIL) 40 MG tablet TAKE 1 TABLET BY MOUTH  DAILY 90 tablet 3   omeprazole (PRILOSEC) 40 MG capsule Take 40 mg by mouth 2 (two) times daily.     sildenafil (VIAGRA) 50 MG tablet TAKE ONE TABLET BY MOUTH ONE TIME DAILY AS NEEDED FOR ERECTILE DYSFUNCTION 85 tablet 0   vortioxetine HBr (TRINTELLIX) 20 MG TABS tablet Take 1 tablet (20 mg total) by mouth daily. 90 tablet 3   Current Facility-Administered Medications  Medication Dose Route Frequency Provider Last Rate Last Admin   diphenoxylate-atropine (LOMOTIL) 2.5-0.025 MG per tablet 1 tablet  1 tablet Oral QID PRN Bosie Clos, MD       diphenoxylate-atropine (LOMOTIL) 2.5-0.025 MG per tablet 1 tablet  1 tablet Oral QID PRN Bosie Clos, MD       promethazine (PHENERGAN) tablet 25 mg  25 mg Oral Once Bosie Clos, MD       promethazine (PHENERGAN) tablet 25 mg  25 mg Oral Q6H PRN Bosie Clos, MD        Medication Side Effects: None  Allergies: No Known Allergies  Past Medical History:  Diagnosis Date   Depression     Past Medical History, Surgical history, Social history, and Family history were reviewed and updated as appropriate.   Please see review of systems for further details on the patient's review from today.   Objective:   Physical Exam:  There were no vitals taken for this visit.  Physical Exam Constitutional:  General: He is not in acute distress. Musculoskeletal:        General: No deformity.  Neurological:     Mental Status: He is alert and oriented to person, place, and time.     Coordination: Coordination normal.  Psychiatric:        Attention and Perception: Attention and perception normal. He does not perceive auditory or visual hallucinations.        Mood and Affect: Affect is not labile, blunt, angry or inappropriate.        Speech: Speech normal.        Behavior: Behavior normal.        Thought Content: Thought content normal.  Thought content is not paranoid or delusional. Thought content does not include homicidal or suicidal ideation. Thought content does not include homicidal or suicidal plan.        Cognition and Memory: Cognition and memory normal.        Judgment: Judgment normal.     Comments: Insight intact     Lab Review:     Component Value Date/Time   NA 138 09/25/2021 1344   K 3.8 09/25/2021 1344   CL 97 09/25/2021 1344   CO2 26 09/25/2021 1344   GLUCOSE 97 09/25/2021 1344   BUN 11 09/25/2021 1344   CREATININE 1.11 09/25/2021 1344   CALCIUM 9.3 09/25/2021 1344   PROT 6.9 09/25/2021 1344   ALBUMIN 4.6 09/25/2021 1344   AST 13 09/25/2021 1344   ALT 12 09/25/2021 1344   ALKPHOS 60 09/25/2021 1344   BILITOT 0.3 09/25/2021 1344   GFRNONAA 70 02/23/2020 0853   GFRAA 80 02/23/2020 0853       Component Value Date/Time   WBC 7.3 09/25/2021 1344   RBC 4.64 09/25/2021 1344   HGB 14.2 09/25/2021 1344   HCT 42.9 09/25/2021 1344   PLT 307 09/25/2021 1344   MCV 93 09/25/2021 1344   MCH 30.6 09/25/2021 1344   MCHC 33.1 09/25/2021 1344   RDW 12.7 09/25/2021 1344   LYMPHSABS 1.7 09/25/2021 1344   EOSABS 0.1 09/25/2021 1344   BASOSABS 0.0 09/25/2021 1344    No results found for: "POCLITH", "LITHIUM"   No results found for: "PHENYTOIN", "PHENOBARB", "VALPROATE", "CBMZ"   .res Assessment: Plan:    Plan:  Continue:  Trintellix 20mg  daily   Buspar 10mg  TID Wellbutrin XL 150mg  daily - 3 daily - denies seizure history  Patient advised to contact office with any questions, adverse effects, or acute worsening in signs and symptoms.  Discussed potential benefits, risks, and side effects of stimulants with patient to include increased heart rate, palpitations, insomnia, increased anxiety, increased irritability, or decreased appetite.  Instructed patient to contact office if experiencing any significant tolerability issues.  RTC 6 months.  There are no diagnoses linked to this encounter.    Please see After Visit Summary for patient specific instructions.  Future Appointments  Date Time Provider Department Center  05/14/2023  3:40 PM Layni Kreamer, Thereasa Solo, NP [facility] None    No orders of the defined types were placed in this encounter.   -------------------------------

## 2023-07-09 LAB — HM DIABETES EYE EXAM

## 2023-08-19 ENCOUNTER — Ambulatory Visit: Payer: 59 | Admitting: Family Medicine

## 2023-08-19 VITALS — BP 132/90 | HR 74 | Temp 97.5°F | Ht 72.0 in | Wt 219.2 lb

## 2023-08-19 DIAGNOSIS — I10 Essential (primary) hypertension: Secondary | ICD-10-CM | POA: Diagnosis not present

## 2023-08-19 DIAGNOSIS — Z23 Encounter for immunization: Secondary | ICD-10-CM | POA: Diagnosis not present

## 2023-08-19 MED ORDER — LISINOPRIL 40 MG PO TABS: 40.0000 mg | ORAL_TABLET | Freq: Every day | ORAL | 3 refills | Status: DC

## 2023-08-19 MED ORDER — AMLODIPINE BESYLATE 5 MG PO TABS: 5.0000 mg | ORAL_TABLET | Freq: Every day | ORAL | 3 refills | Status: DC

## 2023-08-19 NOTE — Assessment & Plan Note (Addendum)
Recent elevated blood pressure readings at home (up to 165/115) and eye changes suggestive of hypertensive retinopathy. Currently on Amlodipine 5mg  daily and Lisinopril 40mg  daily. Blood pressure in office 132/90 and 126/72. -Continue Amlodipine 5mg  daily and Lisinopril 40mg  daily. -Order CMP to assess kidney,potassium and liver function. -Check blood pressure at home and report any readings above 140/90. -Follow-up in March 2025 unless issues arise.

## 2023-08-19 NOTE — Progress Notes (Signed)
Established patient visit   Patient: James Arellano   DOB: 1961-06-19   62 y.o. Male  MRN: [id number] Visit Date: 08/19/2023  Today's healthcare provider: Ronnald Ramp, MD   Chief Complaint  Patient presents with   Medication Refill   Hypertension   Subjective       Discussed the use of AI scribe software for clinical note transcription with the patient, who gave verbal consent to proceed.  History of Present Illness   James Arellano, a patient with a history of hypertension, presents after a period of non-compliance with his antihypertensive medications due to a lapse in care after his previous physician retired. He reports that he had been off his blood pressure medications for an unspecified period, during which he experienced an ocular issue. An ophthalmologist identified some bleeding and swelling in his eye, which was partially attributed to uncontrolled hypertension. This prompted the patient to monitor his blood pressure at home, revealing readings as high as 165/115. His wife, a rheumatologist, provided him with samples of antihypertensive medications, including Amlodipine 5mg  and Lisinopril 40mg , which he has been taking daily.  The patient denies experiencing any symptoms typically associated with hypertension, such as headaches, chest pain, or blurry vision. He reports feeling better since resuming his blood pressure medications. He is also on Wellbutrin 150mg , managed by another provider, and Prilosec 40mg  twice daily, managed by a GI specialist.  The patient recently underwent an ocular procedure involving a needle insertion into his eye to address the aforementioned bleeding and swelling. He is due for a follow-up with his ophthalmologist in two weeks. He denies any smoking history.         Past Medical History:  Diagnosis Date   Depression     Medications: Outpatient Medications Prior to Visit  Medication Sig   buPROPion (WELLBUTRIN XL) 150  MG 24 hr tablet TAKE 3 TABLETS BY MOUTH DAILY   busPIRone (BUSPAR) 10 MG tablet Take 1 tablet (10 mg total) by mouth 3 (three) times daily.   omeprazole (PRILOSEC) 40 MG capsule Take 40 mg by mouth 2 (two) times daily.   sildenafil (VIAGRA) 50 MG tablet TAKE ONE TABLET BY MOUTH ONE TIME DAILY AS NEEDED FOR ERECTILE DYSFUNCTION   vortioxetine HBr (TRINTELLIX) 20 MG TABS tablet Take 1 tablet (20 mg total) by mouth daily.   [DISCONTINUED] amLODipine (NORVASC) 5 MG tablet TAKE 1 TABLET BY MOUTH DAILY   [DISCONTINUED] lisinopril (ZESTRIL) 40 MG tablet TAKE 1 TABLET BY MOUTH  DAILY   Facility-Administered Medications Prior to Visit  Medication Dose Route Frequency Provider   diphenoxylate-atropine (LOMOTIL) 2.5-0.025 MG per tablet 1 tablet  1 tablet Oral QID PRN Bosie Clos, MD   diphenoxylate-atropine (LOMOTIL) 2.5-0.025 MG per tablet 1 tablet  1 tablet Oral QID PRN Bosie Clos, MD   promethazine (PHENERGAN) tablet 25 mg  25 mg Oral Once Bosie Clos, MD   promethazine (PHENERGAN) tablet 25 mg  25 mg Oral Q6H PRN Bosie Clos, MD    Review of Systems  Last metabolic panel Lab Results  Component Value Date   GLUCOSE 97 09/25/2021   NA 138 09/25/2021   K 3.8 09/25/2021   CL 97 09/25/2021   CO2 26 09/25/2021   BUN 11 09/25/2021   CREATININE 1.11 09/25/2021   EGFR 76 09/25/2021   CALCIUM 9.3 09/25/2021   PROT 6.9 09/25/2021   ALBUMIN 4.6 09/25/2021   LABGLOB 2.3 09/25/2021   AGRATIO 2.0 09/25/2021  BILITOT 0.3 09/25/2021   ALKPHOS 60 09/25/2021   AST 13 09/25/2021   ALT 12 09/25/2021   Last lipids Lab Results  Component Value Date   CHOL 242 (H) 09/25/2021   HDL 54 09/25/2021   LDLCALC 175 (H) 09/25/2021   TRIG 76 09/25/2021   CHOLHDL 4.5 09/25/2021   Last hemoglobin A1c No results found for: "HGBA1C"      Objective    BP (!) 132/90 (BP Location: Left Arm, Patient Position: Sitting, Cuff Size: Normal)   Pulse 74   Temp (!) 97.5 F (36.4 C)  (Oral)   Ht 6' (1.829 m)   Wt 219 lb 3.2 oz (99.4 kg)   SpO2 97%   BMI 29.73 kg/m   BP Readings from Last 3 Encounters:  08/19/23 (!) 132/90  01/01/22 129/88  09/25/21 (!) 141/86   Wt Readings from Last 3 Encounters:  08/19/23 219 lb 3.2 oz (99.4 kg)  01/01/22 212 lb (96.2 kg)  09/25/21 188 lb (85.3 kg)       Physical Exam  Physical Exam   VITALS: BP- 126/72 NECK: No nodules, thryoid is not enlarged. CHEST: Lungs clear to auscultation w/o wheezing, no crackles nor rhonchi CARDIOVASCULAR: RRR , no murmurs. EXTREMITIES: No edema in legs.       No results found for any visits on 08/19/23.  Assessment & Plan     Problem List Items Addressed This Visit     Essential hypertension - Primary    Recent elevated blood pressure readings at home (up to 165/115) and eye changes suggestive of hypertensive retinopathy. Currently on Amlodipine 5mg  daily and Lisinopril 40mg  daily. Blood pressure in office 132/90 and 126/72. -Continue Amlodipine 5mg  daily and Lisinopril 40mg  daily. -Order CMP to assess kidney,potassium and liver function. -Check blood pressure at home and report any readings above 140/90. -Follow-up in March 2025 unless issues arise.      Relevant Medications   lisinopril (ZESTRIL) 40 MG tablet   amLODipine (NORVASC) 5 MG tablet   Other Relevant Orders   Comprehensive metabolic panel   Other Visit Diagnoses     Immunization due       Relevant Orders   Flu vaccine trivalent PF, 6mos and older(Flulaval,Afluria,Fluarix,Fluzone) (Completed)   Benign hypertension       Relevant Medications   lisinopril (ZESTRIL) 40 MG tablet   amLODipine (NORVASC) 5 MG tablet           General Health Maintenance -Administer influenza vaccine today. -Schedule physical for March 2025.         Return in about 5 months (around 01/17/2024) for CPE.         Ronnald Ramp, MD  [facility] [phone] (phone) [phone]  (fax)  [facility]

## 2023-08-20 LAB — COMPREHENSIVE METABOLIC PANEL
ALT: 12 [IU]/L (ref 0–44)
AST: 15 [IU]/L (ref 0–40)
Albumin: 4.8 g/dL (ref 3.9–4.9)
Alkaline Phosphatase: 81 [IU]/L (ref 44–121)
BUN/Creatinine Ratio: 16 (ref 10–24)
BUN: 23 mg/dL (ref 8–27)
Bilirubin Total: 0.5 mg/dL (ref 0.0–1.2)
CO2: 23 mmol/L (ref 20–29)
Calcium: 9.7 mg/dL (ref 8.6–10.2)
Chloride: 99 mmol/L (ref 96–106)
Creatinine, Ser: 1.41 mg/dL — ABNORMAL HIGH (ref 0.76–1.27)
Globulin, Total: 2.6 g/dL (ref 1.5–4.5)
Glucose: 109 mg/dL — ABNORMAL HIGH (ref 70–99)
Potassium: 4.6 mmol/L (ref 3.5–5.2)
Sodium: 140 mmol/L (ref 134–144)
Total Protein: 7.4 g/dL (ref 6.0–8.5)
eGFR: 57 mL/min/{1.73_m2} — ABNORMAL LOW (ref >59)

## 2023-09-07 ENCOUNTER — Encounter: Payer: Self-pay | Admitting: Family Medicine

## 2023-09-16 ENCOUNTER — Ambulatory Visit: Payer: 59 | Admitting: Physician Assistant

## 2023-10-31 ENCOUNTER — Other Ambulatory Visit: Payer: Self-pay | Admitting: Adult Health

## 2023-10-31 DIAGNOSIS — F32A Depression, unspecified: Secondary | ICD-10-CM

## 2023-11-12 ENCOUNTER — Ambulatory Visit: Payer: 59 | Admitting: Adult Health

## 2023-11-16 ENCOUNTER — Ambulatory Visit: Payer: 59 | Admitting: Adult Health

## 2023-11-16 ENCOUNTER — Encounter: Payer: Self-pay | Admitting: Adult Health

## 2023-11-16 DIAGNOSIS — F32A Depression, unspecified: Secondary | ICD-10-CM | POA: Diagnosis not present

## 2023-11-16 DIAGNOSIS — F411 Generalized anxiety disorder: Secondary | ICD-10-CM | POA: Diagnosis not present

## 2023-11-16 MED ORDER — BUPROPION HCL ER (XL) 150 MG PO TB24: 450.0000 mg | ORAL_TABLET | Freq: Every day | ORAL | 1 refills | Status: DC

## 2023-11-16 MED ORDER — VORTIOXETINE HBR 20 MG PO TABS: 20.0000 mg | ORAL_TABLET | Freq: Every day | ORAL | 3 refills | Status: DC

## 2023-11-16 NOTE — Progress Notes (Signed)
 [name] [id number] 09/01/1961 63 y.o.  Subjective:   Patient ID:  James Arellano is a 63 y.o. (DOB 1960-12-02) male.  Chief Complaint: No chief complaint on file.  HPI James Arellano presents to the office today for follow-up of anxiety, depression and insomnia.  Describes mood today as ok. Pleasant. Denies tearfulness. Mood symptoms - denies depression, anxiety and irritability. Denies worry, rumination and over thinking. Denies panic attacks. Mood is consistent. Stating I feel like I'm doing well. Feels like medications continue to work well overall. Family doing well. Stable interest and motivation. Taking medications as prescribed. Energy levels stable. Active, has a regular exercise routine.  Enjoys some usual interests and activities. Lives with partner - has 2 adult children.  Appetite adequate. Weight stable. Sleeps better some nights than others. Averages 7 to 8 hours. Focus and concentration. Completing tasks. Managing aspects of household. Work going well. Works for [employer]. Denies SI or HI.  Denies AH or VH. Denies self harm. Denies substance use.   PHQ2-9    Flowsheet Row Office Visit from 08/19/2023 in [facility] Office Visit from 01/01/2022 in [facility] Office Visit from 02/15/2020 in [facility] Office Visit from 10/12/2018 in [facility]  PHQ-2 Total Score 0 0 0 0  PHQ-9 Total Score -- 0 0 0       Review of Systems:  Review of Systems  Musculoskeletal:  Negative for gait problem.  Neurological:  Negative for tremors.  Psychiatric/Behavioral:         Please refer to HPI    Medications: I have reviewed the patient's current medications.  Current Outpatient Medications  Medication Sig Dispense Refill   amLODipine  (NORVASC ) 5 MG tablet Take 1 tablet (5 mg total) by mouth daily. 90 tablet 3   buPROPion  (WELLBUTRIN  XL) 150  MG 24 hr tablet TAKE 3 TABLETS BY MOUTH DAILY 270 tablet 1   busPIRone  (BUSPAR ) 10 MG tablet Take 1 tablet (10 mg total) by mouth 3 (three) times daily. 270 tablet 3   lisinopril  (ZESTRIL ) 40 MG tablet Take 1 tablet (40 mg total) by mouth daily. 90 tablet 3   omeprazole  (PRILOSEC) 40 MG capsule Take 40 mg by mouth 2 (two) times daily.     sildenafil  (VIAGRA ) 50 MG tablet TAKE ONE TABLET BY MOUTH ONE TIME DAILY AS NEEDED FOR ERECTILE DYSFUNCTION 85 tablet 0   vortioxetine  HBr (TRINTELLIX ) 20 MG TABS tablet Take 1 tablet (20 mg total) by mouth daily. 90 tablet 3   Current Facility-Administered Medications  Medication Dose Route Frequency Provider Last Rate Last Admin   diphenoxylate -atropine  (LOMOTIL ) 2.5-0.025 MG per tablet 1 tablet  1 tablet Oral QID PRN Bertrum Charlie CROME, MD       diphenoxylate -atropine  (LOMOTIL ) 2.5-0.025 MG per tablet 1 tablet  1 tablet Oral QID PRN Bertrum Charlie CROME, MD       promethazine  (PHENERGAN ) tablet 25 mg  25 mg Oral Once Bertrum Charlie CROME, MD       promethazine  (PHENERGAN ) tablet 25 mg  25 mg Oral Q6H PRN Bertrum Charlie CROME, MD        Medication Side Effects: None  Allergies: No Known Allergies  Past Medical History:  Diagnosis Date   Depression     Past Medical History, Surgical history, Social history, and Family history were reviewed and updated as appropriate.   Please see review of systems for further details on the  patient's review from today.   Objective:   Physical Exam:  There were no vitals taken for this visit.  Physical Exam Constitutional:      General: He is not in acute distress. Musculoskeletal:        General: No deformity.  Neurological:     Mental Status: He is alert and oriented to person, place, and time.     Coordination: Coordination normal.  Psychiatric:        Attention and Perception: Attention and perception normal. He does not perceive auditory or visual hallucinations.        Mood and Affect: Mood normal. Mood is  not anxious or depressed. Affect is not labile, blunt, angry or inappropriate.        Speech: Speech normal.        Behavior: Behavior normal.        Thought Content: Thought content normal. Thought content is not paranoid or delusional. Thought content does not include homicidal or suicidal ideation. Thought content does not include homicidal or suicidal plan.        Cognition and Memory: Cognition and memory normal.        Judgment: Judgment normal.     Comments: Insight intact     Lab Review:     Component Value Date/Time   NA 140 08/19/2023 1431   K 4.6 08/19/2023 1431   CL 99 08/19/2023 1431   CO2 23 08/19/2023 1431   GLUCOSE 109 (H) 08/19/2023 1431   BUN 23 08/19/2023 1431   CREATININE 1.41 (H) 08/19/2023 1431   CALCIUM  9.7 08/19/2023 1431   PROT 7.4 08/19/2023 1431   ALBUMIN 4.8 08/19/2023 1431   AST 15 08/19/2023 1431   ALT 12 08/19/2023 1431   ALKPHOS 81 08/19/2023 1431   BILITOT 0.5 08/19/2023 1431   GFRNONAA 70 02/23/2020 0853   GFRAA 80 02/23/2020 0853       Component Value Date/Time   WBC 7.3 09/25/2021 1344   RBC 4.64 09/25/2021 1344   HGB 14.2 09/25/2021 1344   HCT 42.9 09/25/2021 1344   PLT 307 09/25/2021 1344   MCV 93 09/25/2021 1344   MCH 30.6 09/25/2021 1344   MCHC 33.1 09/25/2021 1344   RDW 12.7 09/25/2021 1344   LYMPHSABS 1.7 09/25/2021 1344   EOSABS 0.1 09/25/2021 1344   BASOSABS 0.0 09/25/2021 1344    No results found for: POCLITH, LITHIUM   No results found for: PHENYTOIN, PHENOBARB, VALPROATE, CBMZ   .res Assessment: Plan:    Plan:  Continue:  Trintellix  20mg  daily   Wellbutrin  XL 150mg  daily - 3 daily - denies seizure history  Add Xanax  0.5mg  as needed for anxiety  Patient advised to contact office with any questions, adverse effects, or acute worsening in signs and symptoms.  Discussed potential benefits, risks, and side effects of stimulants with patient to include increased heart rate, palpitations, insomnia,  increased anxiety, increased irritability, or decreased appetite.  Instructed patient to contact office if experiencing any significant tolerability issues.  RTC 6 months  There are no diagnoses linked to this encounter.   Please see After Visit Summary for patient specific instructions.  Future Appointments  Date Time Provider Department Center  11/16/2023  3:30 PM Bently Morath Nattalie, NP [facility] None  01/18/2024  3:20 PM Simmons-Robinson, Rockie, MD [facility]    No orders of the defined types were placed in this encounter.   -------------------------------

## 2023-11-26 ENCOUNTER — Other Ambulatory Visit: Payer: Self-pay | Admitting: Adult Health

## 2023-11-26 DIAGNOSIS — F411 Generalized anxiety disorder: Secondary | ICD-10-CM

## 2023-11-30 ENCOUNTER — Other Ambulatory Visit: Payer: Self-pay

## 2023-11-30 ENCOUNTER — Telehealth: Payer: Self-pay | Admitting: Adult Health

## 2023-11-30 MED ORDER — ALPRAZOLAM 0.5 MG PO TABS: 0.5000 mg | ORAL_TABLET | Freq: Every evening | ORAL | 2 refills | Status: DC | PRN

## 2023-11-30 NOTE — Telephone Encounter (Signed)
PT sent a my chart message stating that his xanax which is a new medicine gina addded. Please send it to [facility]

## 2023-11-30 NOTE — Telephone Encounter (Signed)
Pended xanax 0.5 mg to [facility]

## 2023-12-16 ENCOUNTER — Telehealth: Payer: Self-pay | Admitting: Adult Health

## 2023-12-16 ENCOUNTER — Other Ambulatory Visit: Payer: Self-pay

## 2023-12-16 MED ORDER — ALPRAZOLAM 0.5 MG PO TABS
0.5000 mg | ORAL_TABLET | Freq: Two times a day (BID) | ORAL | 0 refills | Status: DC
Start: 2023-12-16 — End: 2024-01-20

## 2023-12-16 NOTE — Telephone Encounter (Signed)
PENDED XANAX 0.5 MG BID TO [facility].

## 2023-12-16 NOTE — Telephone Encounter (Signed)
James Arellano reports that he is currently taking 0.5 mg xanax as needed but is needing more than usual lately. He reports that work is getting more stressful. He reports that he does  not feel the 0.5 mg, he needs something that is more long lasting throughout the day- or xanax 0.5 mg bid. The 1 a day just is not working.

## 2023-12-16 NOTE — Telephone Encounter (Signed)
Pt sent a my chart message asking if he could increase the xanax. He said it is not as effective as it should be. Please call him and let him know if that is ok at [phone]

## 2023-12-16 NOTE — Telephone Encounter (Signed)
PT NOTIFIED

## 2023-12-16 NOTE — Telephone Encounter (Signed)
LVM TO RC.

## 2024-01-18 ENCOUNTER — Encounter: Payer: Self-pay | Admitting: Family Medicine

## 2024-01-18 ENCOUNTER — Ambulatory Visit (INDEPENDENT_AMBULATORY_CARE_PROVIDER_SITE_OTHER): Payer: 59 | Admitting: Family Medicine

## 2024-01-18 DIAGNOSIS — I10 Essential (primary) hypertension: Secondary | ICD-10-CM | POA: Diagnosis not present

## 2024-01-18 MED ORDER — AMLODIPINE BESYLATE 5 MG PO TABS: 5.0000 mg | ORAL_TABLET | Freq: Every day | ORAL | 3 refills | Status: AC

## 2024-01-18 MED ORDER — LISINOPRIL 40 MG PO TABS
40.0000 mg | ORAL_TABLET | Freq: Every day | ORAL | 3 refills | Status: DC
Start: 2024-01-18 — End: 2024-07-29

## 2024-01-18 NOTE — Progress Notes (Unsigned)
 Complete physical exam   Patient: James Arellano   DOB: 09/08/1961   63 y.o. Male  MRN: [id number] Visit Date: 01/18/2024  Today's healthcare provider: Ronnald Ramp, MD   Chief Complaint  Patient presents with   Annual Exam    No concerns    Subjective    James Arellano is a 63 y.o. male who presents today for a complete physical exam.   He reports consuming a general, well balanced diet.   Gym/ health club routine includes cardio on the treadmill for 30 mins three per week    He does not have additional problems to discuss today.   Discussed the use of AI scribe software for clinical note transcription with the patient, who gave verbal consent to proceed.  History of Present Illness   James Arellano is a 64 year old male who presents for an annual physical exam.  He has experienced a recent weight gain of approximately five pounds following a vacation. He engages in regular physical activity, using a treadmill for 30 minutes, three times a week. He has stopped lifting weights due to a past bicep rupture, which he feels poses more risk than benefit.  He had been following a keto diet but now maintains a balanced diet with guidance from his wife. He has a history of elevated creatinine levels at 1.41 mg/dL from a previous metabolic panel conducted in October, but no recent updates on his cholesterol levels. He has a 10% ASCVD risk score.  He experiences chronic swelling in his foot, which he attributes to a recent three-hour flight. He has received the initial COVID-19 vaccinations and a booster, and had COVID-19 over a year ago in January.         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  Tobacco Use   Smoking status: Never   Smokeless tobacco: Never  Substance and Sexual Activity   Alcohol use: Yes    Comment: wine ususally on the weekend   Drug use: No   Sexual activity: Not Currently  Other Topics Concern   Not on file  Social History Narrative   Not on file   Social Drivers of Health   Financial Resource Strain: Patient Declined (01/17/2024)   Overall Financial Resource Strain (CARDIA)    Difficulty of Paying Living Expenses: Patient declined  Food Insecurity: Patient Declined (01/17/2024)   Hunger Vital Sign    Worried About Running Out of Food in the Last Year: Patient declined    Ran Out of Food in the Last Year: Patient declined  Transportation Needs: Patient Declined (01/17/2024)   PRAPARE - Administrator, Civil Service (Medical): Patient declined    Lack of Transportation (Non-Medical): Patient declined  Physical Activity: Unknown (01/17/2024)   Exercise Vital Sign    Days of Exercise per Week: Patient declined    Minutes of Exercise per Session: 40 min  Stress: Patient Declined (01/17/2024)   [employer] of Occupational Health - Occupational Stress Questionnaire    Feeling of Stress : Patient  declined  Social Connections: Unknown (01/17/2024)   Social Connection and Isolation Panel [NHANES]    Frequency of Communication with Friends and Family: Patient declined    Frequency of Social Gatherings with Friends and Family: Patient declined    Attends Religious Services: Patient declined    Database administrator or Organizations: Patient declined    Attends Engineer, structural: Not on file    Marital Status: Patient declined  Catering manager Violence: Not on file   Family Status  Relation Name Status   Mother  Deceased at age 58       Due to natural causes   Father   Deceased at age 36       cause of death MI   Sister [place] Alive   Brother Casimiro Needle Alive   Sister [place] Alive   Sister [place] Alive   Brother Richard Alive   Brother John Deceased at age 14's       Cause of death- Kidney failure, alcoholism   Brother Financial trader Alive   Brother Customer service manager  No partnership data on file   Family History  Problem Relation Age of Onset   Dementia Mother    Heart disease Father    Heart disease Brother 30's   Heart disease Brother 78's   Peptic Ulcer Disease Brother        removal of part if his esophagus.   GER disease Brother    No Known Allergies   Medications: Outpatient Medications Prior to Visit  Medication Sig   ALPRAZolam (XANAX) 0.5 MG tablet Take 1 tablet (0.5 mg total) by mouth 2 (two) times daily.   buPROPion (WELLBUTRIN XL) 150 MG 24 hr tablet Take 3 tablets (450 mg total) by mouth daily.   omeprazole (PRILOSEC) 40 MG capsule Take 40 mg by mouth 2 (two) times daily.   sildenafil (VIAGRA) 50 MG tablet TAKE ONE TABLET BY MOUTH ONE TIME DAILY AS NEEDED FOR ERECTILE DYSFUNCTION   vortioxetine HBr (TRINTELLIX) 20 MG TABS tablet Take 1 tablet (20 mg total) by mouth daily.   [DISCONTINUED] amLODipine (NORVASC) 5 MG tablet Take 1 tablet (5 mg total) by mouth daily.   [DISCONTINUED] lisinopril (ZESTRIL) 40 MG tablet Take 1 tablet (40 mg total) by mouth daily.   Facility-Administered Medications Prior to Visit  Medication Dose Route Frequency Provider   diphenoxylate-atropine (LOMOTIL) 2.5-0.025 MG per tablet 1 tablet  1 tablet Oral QID PRN Bosie Clos, MD   diphenoxylate-atropine (LOMOTIL) 2.5-0.025 MG per tablet 1 tablet  1 tablet Oral QID PRN Bosie Clos, MD   promethazine (PHENERGAN) tablet 25 mg  25 mg Oral Once Bosie Clos, MD   promethazine (PHENERGAN) tablet 25 mg  25 mg Oral Q6H PRN Bosie Clos, MD    Review of Systems  Last CBC Lab Results  Component Value Date   WBC 7.3 09/25/2021   HGB 14.2  09/25/2021   HCT 42.9 09/25/2021   MCV 93 09/25/2021   MCH 30.6 09/25/2021   RDW 12.7 09/25/2021   PLT 307 09/25/2021   Last metabolic panel Lab Results  Component Value Date   GLUCOSE 109 (H) 08/19/2023   NA 140 08/19/2023   K 4.6 08/19/2023   CL 99 08/19/2023   CO2 23 08/19/2023   BUN 23 08/19/2023   CREATININE 1.41 (H) 08/19/2023   EGFR 57 (L) 08/19/2023   CALCIUM 9.7 08/19/2023   PROT 7.4 08/19/2023   ALBUMIN 4.8 08/19/2023   LABGLOB 2.6 08/19/2023  AGRATIO 2.0 09/25/2021   BILITOT 0.5 08/19/2023   ALKPHOS 81 08/19/2023   AST 15 08/19/2023   ALT 12 08/19/2023   Last lipids Lab Results  Component Value Date   CHOL 242 (H) 09/25/2021   HDL 54 09/25/2021   LDLCALC 175 (H) 09/25/2021   TRIG 76 09/25/2021   CHOLHDL 4.5 09/25/2021   The 10-year ASCVD risk score (Arnett DK, et al., 2019) is: 10.1%   Last hemoglobin A1c No results found for: "HGBA1C"  Last thyroid functions Lab Results  Component Value Date   TSH 1.360 09/25/2021   Last vitamin D No results found for: "25OHVITD2", "25OHVITD3", "VD25OH" Last vitamin B12 and Folate No results found for: "VITAMINB12", "FOLATE"     Objective    BP 115/70   Pulse 63   Ht 6\' 1"  (1.854 m)   Wt 207 lb (93.9 kg)   SpO2 100%   BMI 27.31 kg/m   BP Readings from Last 3 Encounters:  01/18/24 115/70  08/19/23 (!) 132/90  01/01/22 129/88   Wt Readings from Last 3 Encounters:  01/18/24 207 lb (93.9 kg)  08/19/23 219 lb 3.2 oz (99.4 kg)  01/01/22 212 lb (96.2 kg)        Physical Exam Vitals reviewed.  Constitutional:      General: He is not in acute distress.    Appearance: Normal appearance. He is not ill-appearing, toxic-appearing or diaphoretic.  HENT:     Head: Normocephalic and atraumatic.     Right Ear: Tympanic membrane and external ear normal.     Left Ear: Tympanic membrane and external ear normal.     Nose: Nose normal.     Mouth/Throat:     Mouth: Mucous membranes are moist.      Pharynx: No oropharyngeal exudate or posterior oropharyngeal erythema.  Eyes:     General: No scleral icterus.    Extraocular Movements: Extraocular movements intact.     Conjunctiva/sclera: Conjunctivae normal.     Pupils: Pupils are equal, round, and reactive to light.  Neck:     Thyroid: No thyroid mass, thyromegaly or thyroid tenderness.     Vascular: No carotid bruit.  Cardiovascular:     Rate and Rhythm: Normal rate and regular rhythm.     Pulses: Normal pulses.     Heart sounds: Normal heart sounds. No murmur heard.    No friction rub. No gallop.  Pulmonary:     Effort: Pulmonary effort is normal. No respiratory distress.     Breath sounds: Normal breath sounds. No stridor. No wheezing, rhonchi or rales.  Abdominal:     General: Bowel sounds are normal. There is no distension.     Palpations: Abdomen is soft.     Tenderness: There is no abdominal tenderness.  Musculoskeletal:        General: No swelling, tenderness or signs of injury. Normal range of motion.     Cervical back: Normal range of motion and neck supple. No erythema, rigidity or tenderness.     Right lower leg: No edema.     Left lower leg: Edema present.  Lymphadenopathy:     Cervical: No cervical adenopathy.  Skin:    General: Skin is warm and dry.     Capillary Refill: Capillary refill takes less than 2 seconds.     Findings: No erythema or rash.  Neurological:     General: No focal deficit present.     Mental Status: He is alert and oriented to person, place,  and time.     Cranial Nerves: Cranial nerves 2-12 are intact.     Sensory: Sensation is intact.     Motor: Motor function is intact. No weakness, tremor or abnormal muscle tone.     Gait: Gait normal.  Psychiatric:        Attention and Perception: Attention normal.        Mood and Affect: Mood normal.        Speech: Speech normal.        Behavior: Behavior normal. Behavior is cooperative.        Thought Content: Thought content normal.        Last depression screening scores    01/18/2024    3:46 PM 08/19/2023    2:02 PM 01/01/2022    1:22 PM  PHQ 2/9 Scores  PHQ - 2 Score 0 0 0  PHQ- 9 Score   0    Last fall risk screening    08/19/2023    2:02 PM  Fall Risk   Falls in the past year? 0  Number falls in past yr: 0  Injury with Fall? 0  Follow up Falls evaluation completed    Last Audit-C alcohol use screening    01/17/2024    3:35 PM  Alcohol Use Disorder Test (AUDIT)  1. How often do you have a drink containing alcohol? 2  2. How many drinks containing alcohol do you have on a typical day when you are drinking? 0  3. How often do you have six or more drinks on one occasion? 0  AUDIT-C Score 2      Patient-reported   A score of 3 or more in women, and 4 or more in men indicates increased risk for alcohol abuse, EXCEPT if all of the points are from question 1   No results found for any visits on 01/18/24.  Assessment & Plan    Routine Health Maintenance and Physical Exam  Immunization History  Administered Date(s) Administered   Influenza, Seasonal, Injecte, Preservative Fre 08/19/2023   Influenza,inj,Quad PF,6+ Mos 10/12/2018, 09/25/2021   Influenza-Unspecified 08/02/2019   MMR 06/08/1995   PFIZER(Purple Top)SARS-COV-2 Vaccination 12/23/2019, 01/17/2020   Td 03/19/1995   Tdap 02/21/2016    Health Maintenance  Topic Date Due   HIV Screening  Never done   Hepatitis C Screening  Never done   COVID-19 Vaccine (3 - 2024-25 season) 02/03/2024 (Originally 07/05/2023)   Colonoscopy  04/02/2024 (Originally 03/28/2007)   Zoster Vaccines- Shingrix (1 of 2) 04/19/2024 (Originally 03/27/2012)   DTaP/Tdap/Td (3 - Td or Tdap) 02/20/2026   INFLUENZA VACCINE  Completed   HPV VACCINES  Aged Out    Problem List Items Addressed This Visit       Cardiovascular and Mediastinum   Essential hypertension   Relevant Medications   lisinopril (ZESTRIL) 40 MG tablet   amLODipine (NORVASC) 5 MG tablet   Other  Visit Diagnoses       Benign hypertension       Relevant Medications   lisinopril (ZESTRIL) 40 MG tablet   amLODipine (NORVASC) 5 MG tablet         Annual Physical Examination Presents for annual physical examination. Reports a recent weight gain of five pounds due to vacation. Engages in regular exercise, using a treadmill for 30 minutes three times a week, and follows a balanced diet with guidance from his wife, a physician. Ruptured bicep has led to cessation of weightlifting. - Perform physical examination - Schedule follow-up for  next annual physical examination  Chronic Kidney Disease Creatinine level elevated at 1.41 mg/dL in last metabolic panel (October), indicating potential chronic kidney disease requiring monitoring. Prefers scheduling lab work in advance for compliance. - Order updated metabolic panel during next visit - Schedule follow-up visit in September for fasting lab work  Hyperlipidemia Cholesterol levels not updated in recent labs. ASCVD risk at 10%. Monitoring and management of cholesterol levels necessary to manage cardiovascular risk. Prefers scheduling lab work in advance for compliance. - Order cholesterol panel during next visit - Schedule follow-up visit in September for fasting lab work  [employer] Due for routine screenings and vaccinations. COVID-19 vaccine not updated since initial series and booster; had COVID-19 over a year ago. HIV and hepatitis C screenings recommended. - Order A1c screening during next visit - Order CBC for anemia screening during next visit - Discuss and consider updated COVID-19 vaccination - Recommend HIV and hepatitis C screenings        Pt needs CMP, CBC, lipid panel, A1c HIV and Hep C screening at 6 mon follow up    Return in about 6 months (around 07/20/2024) for HTN & labs .       Ronnald Ramp, MD  [facility] [phone] (phone) [phone]  (fax)  [facility]

## 2024-01-19 ENCOUNTER — Other Ambulatory Visit: Payer: Self-pay | Admitting: Adult Health

## 2024-02-24 ENCOUNTER — Encounter: Payer: Self-pay | Admitting: Family Medicine

## 2024-02-24 DIAGNOSIS — H35033 Hypertensive retinopathy, bilateral: Secondary | ICD-10-CM | POA: Insufficient documentation

## 2024-04-30 ENCOUNTER — Other Ambulatory Visit: Payer: Self-pay | Admitting: Adult Health

## 2024-05-16 ENCOUNTER — Ambulatory Visit: Payer: 59 | Admitting: Adult Health

## 2024-05-16 ENCOUNTER — Encounter: Payer: Self-pay | Admitting: Adult Health

## 2024-05-16 DIAGNOSIS — G4700 Insomnia, unspecified: Secondary | ICD-10-CM

## 2024-05-16 DIAGNOSIS — F419 Anxiety disorder, unspecified: Secondary | ICD-10-CM | POA: Diagnosis not present

## 2024-05-16 DIAGNOSIS — F411 Generalized anxiety disorder: Secondary | ICD-10-CM

## 2024-05-16 DIAGNOSIS — F32A Depression, unspecified: Secondary | ICD-10-CM | POA: Diagnosis not present

## 2024-05-16 MED ORDER — BUPROPION HCL ER (XL) 150 MG PO TB24: 450.0000 mg | ORAL_TABLET | Freq: Every day | ORAL | 1 refills | Status: DC

## 2024-05-16 MED ORDER — ALPRAZOLAM 0.5 MG PO TABS: 0.5000 mg | ORAL_TABLET | Freq: Two times a day (BID) | ORAL | 0 refills | Status: DC

## 2024-05-16 NOTE — Progress Notes (Signed)
 James Arellano [id number] 1961/05/08 63 y.o.  Subjective:   Patient ID:  James Arellano is a 63 y.o. (DOB 1961/03/08) male.  Chief Complaint: No chief complaint on file.   HPI James Arellano presents to the office today for follow-up of anxiety, depression and insomnia.  Describes mood today as ok. Pleasant. Denies tearfulness. Mood symptoms - denies depression, anxiety and irritability. Denies worry, rumination and over thinking. Denies panic attacks. Mood is consistent. Stating I feel like I'm doing ok. Feels like medications continue to work well overall. Family doing well. Stable interest and motivation. Taking medications as prescribed. Energy levels stable. Active, exercising some days.  Enjoys some usual interests and activities. Lives with partner - has 2 adult children.  Appetite adequate. Weight stable. Sleeps better some nights than others. Averages 7 to 8 hours. Focus and concentration stable. Completing tasks. Managing aspects of household. Work going well. Works for [employer]. Denies SI or HI.  Denies AH or VH. Denies self harm. Denies substance use.  GAD-7    Flowsheet Row Office Visit from 01/18/2024 in [facility]  Total GAD-7 Score 0   PHQ2-9    Flowsheet Row Office Visit from 01/18/2024 in [facility] Office Visit from 08/19/2023 in [facility] Office Visit from 01/01/2022 in [facility] Office Visit from 02/15/2020 in [facility] Office Visit from 10/12/2018 in [facility]  PHQ-2 Total Score 0 0 0 0 0  PHQ-9 Total Score -- -- 0 0 0     Review of Systems:  Review of Systems  Musculoskeletal:  Negative for gait problem.  Neurological:  Negative for tremors.  Psychiatric/Behavioral:         Please refer to HPI    Medications: I have reviewed the patient's current  medications.  Current Outpatient Medications  Medication Sig Dispense Refill   ALPRAZolam  (XANAX ) 0.5 MG tablet TAKE 1 TABLET BY MOUTH TWICE  DAILY 60 tablet 0   amLODipine  (NORVASC ) 5 MG tablet Take 1 tablet (5 mg total) by mouth daily. 90 tablet 3   buPROPion  (WELLBUTRIN  XL) 150 MG 24 hr tablet Take 3 tablets (450 mg total) by mouth daily. 270 tablet 1   lisinopril  (ZESTRIL ) 40 MG tablet Take 1 tablet (40 mg total) by mouth daily. 90 tablet 3   omeprazole (PRILOSEC) 40 MG capsule Take 40 mg by mouth 2 (two) times daily.     sildenafil  (VIAGRA ) 50 MG tablet TAKE ONE TABLET BY MOUTH ONE TIME DAILY AS NEEDED FOR ERECTILE DYSFUNCTION 85 tablet 0   vortioxetine  HBr (TRINTELLIX ) 20 MG TABS tablet Take 1 tablet (20 mg total) by mouth daily. 90 tablet 3   Current Facility-Administered Medications  Medication Dose Route Frequency Provider Last Rate Last Admin   diphenoxylate -atropine  (LOMOTIL ) 2.5-0.025 MG per tablet 1 tablet  1 tablet Oral QID PRN Bertrum Charlie CROME, MD       diphenoxylate -atropine  (LOMOTIL ) 2.5-0.025 MG per tablet 1 tablet  1 tablet Oral QID PRN Bertrum Charlie CROME, MD       promethazine  (PHENERGAN ) tablet 25 mg  25 mg Oral Once Bertrum Charlie CROME, MD       promethazine  (PHENERGAN ) tablet 25 mg  25 mg Oral Q6H PRN Bertrum Charlie CROME, MD        Medication Side Effects: None  Allergies: No Known Allergies  Past Medical History:  Diagnosis Date   Depression  Past Medical History, Surgical history, Social history, and Family history were reviewed and updated as appropriate.   Please see review of systems for further details on the patient's review from today.   Objective:   Physical Exam:  There were no vitals taken for this visit.  Physical Exam  Lab Review:     Component Value Date/Time   NA 140 08/19/2023 1431   K 4.6 08/19/2023 1431   CL 99 08/19/2023 1431   CO2 23 08/19/2023 1431   GLUCOSE 109 (H) 08/19/2023 1431   BUN 23 08/19/2023 1431   CREATININE  1.41 (H) 08/19/2023 1431   CALCIUM 9.7 08/19/2023 1431   PROT 7.4 08/19/2023 1431   ALBUMIN 4.8 08/19/2023 1431   AST 15 08/19/2023 1431   ALT 12 08/19/2023 1431   ALKPHOS 81 08/19/2023 1431   BILITOT 0.5 08/19/2023 1431   GFRNONAA 70 02/23/2020 0853   GFRAA 80 02/23/2020 0853       Component Value Date/Time   WBC 7.3 09/25/2021 1344   RBC 4.64 09/25/2021 1344   HGB 14.2 09/25/2021 1344   HCT 42.9 09/25/2021 1344   PLT 307 09/25/2021 1344   MCV 93 09/25/2021 1344   MCH 30.6 09/25/2021 1344   MCHC 33.1 09/25/2021 1344   RDW 12.7 09/25/2021 1344   LYMPHSABS 1.7 09/25/2021 1344   EOSABS 0.1 09/25/2021 1344   BASOSABS 0.0 09/25/2021 1344    No results found for: POCLITH, LITHIUM   No results found for: PHENYTOIN, PHENOBARB, VALPROATE, CBMZ   .res Assessment: Plan:    Plan:  Continue:  Trintellix  20mg  daily   Wellbutrin  XL 150mg  daily - 3 daily - denies seizure history Xanax  0.5mg  as needed for anxiety  Patient advised to contact office with any questions, adverse effects, or acute worsening in signs and symptoms.  Discussed potential benefits, risks, and side effects of stimulants with patient to include increased heart rate, palpitations, insomnia, increased anxiety, increased irritability, or decreased appetite.  Instructed patient to contact office if experiencing any significant tolerability issues.  RTC 6 months  There are no diagnoses linked to this encounter.   Please see After Visit Summary for patient specific instructions.  Future Appointments  Date Time Provider Department Center  05/16/2024  3:30 PM March Joos Nattalie, NP [facility] None  07/20/2024  3:40 PM Simmons-Robinson, Rockie, MD [facility]    No orders of the defined types were placed in this encounter.   -------------------------------

## 2024-07-04 ENCOUNTER — Other Ambulatory Visit: Payer: Self-pay | Admitting: Adult Health

## 2024-07-04 DIAGNOSIS — G4700 Insomnia, unspecified: Secondary | ICD-10-CM

## 2024-07-04 DIAGNOSIS — F411 Generalized anxiety disorder: Secondary | ICD-10-CM

## 2024-07-20 ENCOUNTER — Encounter: Payer: Self-pay | Admitting: Family Medicine

## 2024-07-20 ENCOUNTER — Ambulatory Visit: Admitting: Family Medicine

## 2024-07-20 VITALS — BP 119/80 | HR 72 | Temp 97.7°F | Ht 73.0 in | Wt 201.9 lb

## 2024-07-20 DIAGNOSIS — I10 Essential (primary) hypertension: Secondary | ICD-10-CM | POA: Diagnosis not present

## 2024-07-20 DIAGNOSIS — Z23 Encounter for immunization: Secondary | ICD-10-CM | POA: Diagnosis not present

## 2024-07-20 DIAGNOSIS — Z8249 Family history of ischemic heart disease and other diseases of the circulatory system: Secondary | ICD-10-CM

## 2024-07-20 DIAGNOSIS — R42 Dizziness and giddiness: Secondary | ICD-10-CM

## 2024-07-20 DIAGNOSIS — R0683 Snoring: Secondary | ICD-10-CM

## 2024-07-20 DIAGNOSIS — R0789 Other chest pain: Secondary | ICD-10-CM

## 2024-07-20 NOTE — Patient Instructions (Signed)
 To keep you healthy, please keep in mind the following health maintenance items that you are due for:   Health Maintenance Due  Topic Date Due   HIV Screening  Never done   Hepatitis C Screening  Never done     Best Wishes,   Dr. Lang
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
## 2024-07-22 ENCOUNTER — Ambulatory Visit: Attending: Cardiovascular Disease | Admitting: Cardiovascular Disease

## 2024-07-22 ENCOUNTER — Encounter: Payer: Self-pay | Admitting: Cardiovascular Disease

## 2024-07-22 VITALS — BP 138/86 | HR 74 | Ht 72.5 in | Wt 199.6 lb

## 2024-07-22 DIAGNOSIS — I209 Angina pectoris, unspecified: Secondary | ICD-10-CM

## 2024-07-22 DIAGNOSIS — I10 Essential (primary) hypertension: Secondary | ICD-10-CM

## 2024-07-22 DIAGNOSIS — Z8249 Family history of ischemic heart disease and other diseases of the circulatory system: Secondary | ICD-10-CM | POA: Diagnosis not present

## 2024-07-22 DIAGNOSIS — R42 Dizziness and giddiness: Secondary | ICD-10-CM | POA: Diagnosis not present

## 2024-07-22 DIAGNOSIS — R0789 Other chest pain: Secondary | ICD-10-CM | POA: Diagnosis not present

## 2024-07-22 DIAGNOSIS — E782 Mixed hyperlipidemia: Secondary | ICD-10-CM

## 2024-07-22 MED ORDER — METOPROLOL TARTRATE 100 MG PO TABS: 100.0000 mg | ORAL_TABLET | Freq: Once | ORAL | 0 refills | Status: DC

## 2024-07-22 NOTE — Progress Notes (Signed)
 Cardiology Office Note  Date:  07/22/2024   ID:  James Arellano, DOB 11/27/1960, MRN [id number]  PCP:  James Coyer, MD   Cc: Chest pressure  HPI:  James Arellano a 63 y.o. malewith past medical history of: Depression/anxiety Hyperlipidemia Who presents by referral from James Arellano for consultation of his angina/chest pressure, dizziness, hypertension, family history coronary atherosclerosis  Reports in general has been in good health Reports his wife recently noticed ruptured biceps on right, rotator cuff tear, he did not notice  He has appreciated Chest pressure on rest and exertion, More SOB on exertion Don't have the energy he used to have No regular exercise program but tries to stay busy  Wife is a rheumatologist wanted him to get checked out given his worsening symptoms  Reports some episodes of dizziness usually when he is in the heat, sometimes on standing  Remote lab work 2022 Total cholesterol 242 LDL 175 Repeat lipid panel pending  Family hx:  father 56s CAD< MI Brothers x 2 with CAD, mid 30s,   EKG personally reviewed by myself on todays visit EKG Interpretation Date/Time:  Friday July 22 2024 15:11:04 EDT Ventricular Rate:  72 PR Interval:  158 QRS Duration:  104 QT Interval:  394 QTC Calculation: 431 R Axis:   -30  Text Interpretation: Normal sinus rhythm Left axis deviation No previous ECGs available Confirmed by James Arellano [phone]) on 07/22/2024 3:22:33 PM     PMH:   has a past medical history of Depression, GERD (gastroesophageal reflux disease), and Hypertension.  PSH:    Past Surgical History:  Procedure Laterality Date   ESOPHAGOGASTRODUODENOSCOPY ENDOSCOPY  2015    Current Outpatient Medications  Medication Sig Dispense Refill   ALPRAZolam  (XANAX ) 0.5 MG tablet TAKE 1 TABLET BY MOUTH TWICE  DAILY 180 tablet 0   amLODipine  (NORVASC ) 5 MG tablet Take 1 tablet (5 mg total) by mouth daily. 90 tablet  3   buPROPion  (WELLBUTRIN  XL) 150 MG 24 hr tablet Take 3 tablets (450 mg total) by mouth daily. 270 tablet 1   lisinopril  (ZESTRIL ) 40 MG tablet Take 1 tablet (40 mg total) by mouth daily. 90 tablet 3   omeprazole (PRILOSEC) 40 MG capsule Take 40 mg by mouth 2 (two) times daily.     sildenafil  (VIAGRA ) 50 MG tablet TAKE ONE TABLET BY MOUTH ONE TIME DAILY AS NEEDED FOR ERECTILE DYSFUNCTION 85 tablet 0   vortioxetine  HBr (TRINTELLIX ) 20 MG TABS tablet Take 1 tablet (20 mg total) by mouth daily. 90 tablet 3   Current Facility-Administered Medications  Medication Dose Route Frequency Provider Last Rate Last Admin   diphenoxylate -atropine  (LOMOTIL ) 2.5-0.025 MG per tablet 1 tablet  1 tablet Oral QID PRN James Charlie CROME, MD       diphenoxylate -atropine  (LOMOTIL ) 2.5-0.025 MG per tablet 1 tablet  1 tablet Oral QID PRN James Charlie CROME, MD       promethazine  (PHENERGAN ) tablet 25 mg  25 mg Oral Once James Charlie CROME, MD       promethazine  (PHENERGAN ) tablet 25 mg  25 mg Oral Q6H PRN James Charlie CROME, MD         Allergies:   Patient has no known allergies.   Social History:  The patient  reports that he has never smoked. He has never used smokeless tobacco. He reports current alcohol use. He reports that he does not use drugs.   Family History:   family history includes Dementia in his mother; GER  disease in his brother; Heart disease in his father; Heart disease (age of onset: 63) in his brother; Heart disease (age of onset: 2) in his brother; Peptic Ulcer Disease in his brother.    Review of Systems: Review of Systems  Constitutional: Negative.   HENT: Negative.    Respiratory:  Positive for shortness of breath.   Cardiovascular:  Positive for chest pain.  Gastrointestinal: Negative.   Musculoskeletal: Negative.   Neurological: Negative.   Psychiatric/Behavioral: Negative.    All other systems reviewed and are negative.   PHYSICAL EXAM: VS:  BP 138/86   Pulse 74   Ht 6' 0.5  (1.842 m)   Wt 199 lb 9.6 oz (90.5 kg)   SpO2 97%   BMI 26.70 kg/m  , BMI Body mass index is 26.7 kg/m. GEN: Well nourished, well developed, in no acute distress HEENT: normal Neck: no JVD, carotid bruits, or masses Cardiac: RRR; no murmurs, rubs, or gallops,no edema  Respiratory:  clear to auscultation bilaterally, normal work of breathing GI: soft, nontender, nondistended, + BS MS: no deformity or atrophy Skin: warm and dry, no rash Neuro:  Strength and sensation are intact Psych: euthymic mood, full affect   Recent Labs: 08/19/2023: ALT 12; BUN 23; Creatinine, Ser 1.41; Potassium 4.6; Sodium 140    Lipid Panel Lab Results  Component Value Date   CHOL 242 (H) 09/25/2021   HDL 54 09/25/2021   LDLCALC 175 (H) 09/25/2021   TRIG 76 09/25/2021      Wt Readings from Last 3 Encounters:  07/22/24 199 lb 9.6 oz (90.5 kg)  07/20/24 201 lb 14.4 oz (91.6 kg)  01/18/24 207 lb (93.9 kg)       ASSESSMENT AND PLAN:  Problem List Items Addressed This Visit       Cardiology Problems   Essential hypertension   Relevant Orders   EKG 12-Lead (Completed)     Other   Chest pressure - Primary   Relevant Orders   EKG 12-Lead (Completed)   Dizziness   Relevant Orders   EKG 12-Lead (Completed)   Family history of coronary arteriosclerosis   Relevant Orders   EKG 12-Lead (Completed)   Chest tightness/angina Reports having worsening symptoms Strong family history cardiac disease, father in his early 52s, both of his brothers Hyperlipidemia not treated Discussed various treatment options for ischemic workup Recommended cardiac CTA for further evaluation -Premedicate with metoprolol  for rate control  Hyperlipidemia - For coronary disease, would need to treat cholesterol more aggressively Has repeat lipid panel pending Typically total cholesterol running 200 or higher Strong family history of cardiac disease  Essential hypertension Blood pressure is well controlled on  today's visit. No changes made to the medications.  Anxiety On Wellbutrin , Xanax  as needed  [name] was seen in consultation for Dr. Creston Arellano and will be referred back to her office for ongoing care of the issues detailed above  Signed, James Arellano, M.D., Ph.D. [facility] [place], [place] [phone]

## 2024-07-22 NOTE — Patient Instructions (Addendum)
 Medication Instructions:  No changes  If you need a refill on your cardiac medications before your next appointment, please call your pharmacy.   Lab work: No new labs needed  Testing/Procedures: Cardiac CTA for angina, high cholesterol  Your physician has requested that you have cardiac CT. Cardiac computed tomography (CT) is a painless test that uses an x-ray machine to take clear, detailed pictures of your heart. For further information please visit [email or web address] Please follow instruction sheet as given.   Follow-Up: At [facility], you and your health needs are our priority.  As part of our continuing mission to provide you with exceptional heart care, we have created designated Provider Care Teams.  These Care Teams include your primary Cardiologist (physician) and Advanced Practice Providers (APPs -  Physician Assistants and Nurse Practitioners) who all work together to provide you with the care you need, when you need it.  You will need a follow up appointment as needed  Providers on your designated Care Team:   Lonni Meager, NP Bernardino Bring, PA-C Cadence [place], [place]  COVID-19 Vaccine Information can be found at: [email or web address] For questions related to vaccine distribution or appointments, please email [email or web address] or call [phone].   Other Instructions:   Your cardiac CT will be scheduled at one of the below locations:   [facility] [address] [place], [place] [address] [phone] (Severe contrast allergies only)  OR   [facility] [address] [place], [place] [address] [phone]  OR   [facility] [address] [place], [place] [address] [phone]  OR   [facility] V[facility] Tower [address]  [place], [place] [address] [phone]  OR   [facility] [address] [place], [place] [phone]  If scheduled at [facility], please arrive at the [facility] and Children's Entrance (Entrance C2) of [facility] 30 minutes prior to test start time. You can use the FREE valet parking offered at entrance C (encouraged to control the heart rate for the test)  Proceed to the [facility] Radiology Department (first floor) to check-in and test prep.  All radiology patients and guests should use entrance C2 at [facility], accessed from [facility], even though the hospital's physical address listed is [address]  If scheduled at the Heart and Vascular Tower at [address], please enter the parking lot using the [address] entrance and use the FREE valet service at the patient drop-off area. Enter the building and check-in with registration on the main floor.  If scheduled at [facility], please arrive to the Heart and Vascular Center 15 mins early for check-in and test prep.  There is spacious parking and easy access to the radiology department from the [facility] Heart and Vascular entrance. Please enter here and check-in with the desk attendant.   If scheduled at [facility], please arrive 30 minutes early for check-in and test prep.  Please follow these instructions carefully (unless otherwise directed):  An IV will be required for this test and Nitroglycerin will be given.  Hold all erectile dysfunction medications at least 3 days (72 hrs) prior to test. (Ie viagra , cialis, sildenafil , tadalafil, etc)   On the Night Before the Test: Be sure to Drink plenty of water. Do not consume any caffeinated/decaffeinated beverages or chocolate 12 hours prior to your test. Do not take any antihistamines 12 hours prior to your  test.  On the Day of the Test: Drink plenty of water until 1 hour prior to the test. Do not eat any food 1 hour prior to test. You may take your regular  medications prior to the test.  Take metoprolol  (Lopressor ) 100 mg two hours prior to test. Sent to [facility]. If you take Furosemide/Hydrochlorothiazide/Spironolactone/Chlorthalidone, please HOLD on the morning of the test. Patients who wear a continuous glucose monitor MUST remove the device prior to scanning.      After the Test: Drink plenty of water. After receiving IV contrast, you may experience a mild flushed feeling. This is normal. On occasion, you may experience a mild rash up to 24 hours after the test. This is not dangerous. If this occurs, you can take Benadryl 25 mg, Zyrtec, Claritin, or Allegra and increase your fluid intake. (Patients taking Tikosyn should avoid Benadryl, and may take Zyrtec, Claritin, or Allegra) If you experience trouble breathing, this can be serious. If it is severe call 911 IMMEDIATELY. If it is mild, please call our office.  We will call to schedule your test 2-4 weeks out understanding that some insurance companies will need an authorization prior to the service being performed.   For more information and frequently asked questions, please visit our website : [email or web address]  For non-scheduling related questions, please contact the cardiac imaging nurse navigator should you have any questions/concerns: Cardiac Imaging Nurse Navigators Direct Office Dial: [phone]   For scheduling needs, including cancellations and rescheduling, please call [place], [phone].

## 2024-07-24 NOTE — Assessment & Plan Note (Signed)
 Chest pain and exertional dyspnea  evaluation for possible cardiac etiology Intermittent chest pressure described as feeling like an elephant sitting on the chest, occurring erratically and sometimes with exertion. Shortness of breath, particularly in the heat. Family history of heart disease. No associated left arm pain. No pitting edema. Regular rate and rhythm, no murmurs, and clear lung sounds. Differential includes cardiac etiology given hypertension and family history. - Refer to cardiology for further evaluation and stress test. - Order lipid panel and comprehensive metabolic panel. - Order thyroid panel to evaluate dizziness.

## 2024-07-24 NOTE — Assessment & Plan Note (Signed)
 Chronic hypertension, well-controlled with current medication regimen. Blood pressure reading of 119/80 mmHg, with systolic usually in the 120s. Reports dizziness when using heat and upon standing, possibly related to blood pressure or medication effects. - Continue current medications: Lisinopril  40 mg and Amlodipine  5 mg.

## 2024-07-26 ENCOUNTER — Encounter (HOSPITAL_COMMUNITY): Payer: Self-pay

## 2024-07-28 ENCOUNTER — Ambulatory Visit (HOSPITAL_COMMUNITY)
Admission: RE | Admit: 2024-07-28 | Discharge: 2024-07-28 | Disposition: A | Discharge status: Home / Self Care | Source: Ambulatory Visit | Attending: Cardiovascular Disease | Admitting: Cardiovascular Disease

## 2024-07-28 DIAGNOSIS — E782 Mixed hyperlipidemia: Secondary | ICD-10-CM | POA: Insufficient documentation

## 2024-07-28 DIAGNOSIS — I209 Angina pectoris, unspecified: Secondary | ICD-10-CM

## 2024-07-28 DIAGNOSIS — I2510 Atherosclerotic heart disease of native coronary artery without angina pectoris: Secondary | ICD-10-CM | POA: Diagnosis not present

## 2024-07-28 DIAGNOSIS — R0789 Other chest pain: Secondary | ICD-10-CM

## 2024-07-28 DIAGNOSIS — I25119 Atherosclerotic heart disease of native coronary artery with unspecified angina pectoris: Secondary | ICD-10-CM

## 2024-07-28 DIAGNOSIS — I700 Atherosclerosis of aorta: Secondary | ICD-10-CM | POA: Insufficient documentation

## 2024-07-28 DIAGNOSIS — I77810 Thoracic aortic ectasia: Secondary | ICD-10-CM | POA: Diagnosis not present

## 2024-07-28 MED ORDER — NITROGLYCERIN 0.4 MG SL SUBL
0.8000 mg | SUBLINGUAL_TABLET | Freq: Once | SUBLINGUAL | Status: AC
  Administered 2024-07-28: 0.8 mg via SUBLINGUAL

## 2024-07-28 MED ORDER — IOHEXOL 350 MG/ML SOLN
100.0000 mL | Freq: Once | INTRAVENOUS | Status: AC | PRN
Start: 2024-07-28 — End: 2024-07-28
  Administered 2024-07-28: 100 mL via INTRAVENOUS

## 2024-07-29 ENCOUNTER — Other Ambulatory Visit: Payer: Self-pay | Admitting: Cardiology

## 2024-07-29 ENCOUNTER — Ambulatory Visit: Payer: Self-pay | Admitting: Cardiology

## 2024-07-29 ENCOUNTER — Ambulatory Visit (HOSPITAL_COMMUNITY)
Admission: RE | Admit: 2024-07-29 | Discharge: 2024-07-29 | Disposition: A | Discharge status: Home / Self Care | Source: Ambulatory Visit | Attending: Cardiology | Admitting: Cardiology

## 2024-07-29 ENCOUNTER — Other Ambulatory Visit: Payer: Self-pay | Admitting: Emergency Medicine

## 2024-07-29 ENCOUNTER — Ambulatory Visit: Payer: Self-pay | Admitting: Cardiovascular Disease

## 2024-07-29 DIAGNOSIS — R0789 Other chest pain: Secondary | ICD-10-CM

## 2024-07-29 DIAGNOSIS — I10 Essential (primary) hypertension: Secondary | ICD-10-CM

## 2024-07-29 DIAGNOSIS — I209 Angina pectoris, unspecified: Secondary | ICD-10-CM

## 2024-07-29 DIAGNOSIS — I25119 Atherosclerotic heart disease of native coronary artery with unspecified angina pectoris: Secondary | ICD-10-CM

## 2024-07-29 DIAGNOSIS — R931 Abnormal findings on diagnostic imaging of heart and coronary circulation: Secondary | ICD-10-CM | POA: Diagnosis not present

## 2024-07-29 DIAGNOSIS — I2510 Atherosclerotic heart disease of native coronary artery without angina pectoris: Secondary | ICD-10-CM | POA: Diagnosis not present

## 2024-07-29 MED ORDER — LISINOPRIL 20 MG PO TABS: 20.0000 mg | ORAL_TABLET | Freq: Every day | ORAL | 3 refills | Status: DC

## 2024-07-29 MED ORDER — METOPROLOL SUCCINATE ER 25 MG PO TB24: 25.0000 mg | ORAL_TABLET | Freq: Every day | ORAL | 3 refills | Status: DC

## 2024-07-29 MED ORDER — ISOSORBIDE MONONITRATE ER 30 MG PO TB24: 30.0000 mg | ORAL_TABLET | Freq: Every day | ORAL | 3 refills | Status: DC

## 2024-07-29 MED ORDER — NITROGLYCERIN 0.4 MG SL SUBL: 0.4000 mg | SUBLINGUAL_TABLET | SUBLINGUAL | 3 refills | Status: AC | PRN

## 2024-07-29 MED ORDER — ASPIRIN 81 MG PO TBEC: 81.0000 mg | DELAYED_RELEASE_TABLET | Freq: Every day | ORAL | Status: AC

## 2024-07-29 NOTE — Addendum Note (Signed)
 Addended by: CRISTOPHER OLIVIA PARAS on: 07/29/2024 10:20 AM   Modules accepted: Orders

## 2024-07-29 NOTE — Telephone Encounter (Signed)
 Spoke with patient and notified him of the following from Dr. Gollan.  The following is from Dr. Gollan.   If he insists on going, would recommend some medication changes Would suggest he decrease lisinopril  down to 20 mg daily Would start metoprolol  succinate 25 daily, also isosorbide  mononitrate 30 mg daily Start aspirin  81 daily Medications above would be preventative We can discuss these medications when he comes back Thx TGollan   He would also like you take Nitroglycerin  as needed for chest pain.   If you have chest pain that doesn't relieve quickly, place one tablet under your tongue and allow it to dissolve.  If no relief after 5 minutes, you may take another pill.  If no relief after 5 minutes, you may take a 3rd dose but you need to call 911 and report to ER immediately.     Patient verbalizes understanding. Prescriptions sent to preferred pharmacy.

## 2024-07-29 NOTE — Telephone Encounter (Signed)
 Called patient and left message for call back.

## 2024-07-29 NOTE — Telephone Encounter (Signed)
 Patient says he is returning a call regarding this matter. Please advise.

## 2024-08-08 ENCOUNTER — Observation Stay
Admission: EM | Admit: 2024-08-08 | Discharge: 2024-08-11 | DRG: 321 | Disposition: A | Attending: Emergency Medicine | Admitting: Emergency Medicine

## 2024-08-08 ENCOUNTER — Emergency Department

## 2024-08-08 ENCOUNTER — Observation Stay (HOSPITAL_BASED_OUTPATIENT_CLINIC_OR_DEPARTMENT_OTHER)
Admit: 2024-08-08 | Discharge: 2024-08-08 | Disposition: A | Discharge status: Home / Self Care | Attending: Internal Medicine | Admitting: Internal Medicine

## 2024-08-08 ENCOUNTER — Encounter: Payer: Self-pay | Admitting: Medical

## 2024-08-08 ENCOUNTER — Other Ambulatory Visit: Payer: Self-pay

## 2024-08-08 ENCOUNTER — Ambulatory Visit: Attending: Medical | Admitting: Medical

## 2024-08-08 VITALS — BP 99/68 | HR 58 | Ht 72.0 in | Wt 198.6 lb

## 2024-08-08 DIAGNOSIS — R55 Syncope and collapse: Secondary | ICD-10-CM

## 2024-08-08 DIAGNOSIS — E785 Hyperlipidemia, unspecified: Secondary | ICD-10-CM | POA: Diagnosis present

## 2024-08-08 DIAGNOSIS — I200 Unstable angina: Secondary | ICD-10-CM | POA: Diagnosis present

## 2024-08-08 DIAGNOSIS — I25110 Atherosclerotic heart disease of native coronary artery with unstable angina pectoris: Principal | ICD-10-CM | POA: Diagnosis present

## 2024-08-08 DIAGNOSIS — Z8249 Family history of ischemic heart disease and other diseases of the circulatory system: Secondary | ICD-10-CM

## 2024-08-08 DIAGNOSIS — I7121 Aneurysm of the ascending aorta, without rupture: Secondary | ICD-10-CM | POA: Diagnosis present

## 2024-08-08 DIAGNOSIS — Z8659 Personal history of other mental and behavioral disorders: Secondary | ICD-10-CM

## 2024-08-08 DIAGNOSIS — Z955 Presence of coronary angioplasty implant and graft: Secondary | ICD-10-CM

## 2024-08-08 DIAGNOSIS — I959 Hypotension, unspecified: Secondary | ICD-10-CM | POA: Diagnosis present

## 2024-08-08 DIAGNOSIS — I10 Essential (primary) hypertension: Secondary | ICD-10-CM | POA: Diagnosis present

## 2024-08-08 DIAGNOSIS — I700 Atherosclerosis of aorta: Secondary | ICD-10-CM | POA: Diagnosis present

## 2024-08-08 DIAGNOSIS — F419 Anxiety disorder, unspecified: Secondary | ICD-10-CM | POA: Diagnosis present

## 2024-08-08 DIAGNOSIS — R42 Dizziness and giddiness: Secondary | ICD-10-CM

## 2024-08-08 DIAGNOSIS — Z79899 Other long term (current) drug therapy: Secondary | ICD-10-CM

## 2024-08-08 DIAGNOSIS — I2510 Atherosclerotic heart disease of native coronary artery without angina pectoris: Secondary | ICD-10-CM | POA: Diagnosis present

## 2024-08-08 DIAGNOSIS — I77810 Thoracic aortic ectasia: Secondary | ICD-10-CM

## 2024-08-08 DIAGNOSIS — I209 Angina pectoris, unspecified: Secondary | ICD-10-CM

## 2024-08-08 DIAGNOSIS — E860 Dehydration: Secondary | ICD-10-CM | POA: Diagnosis not present

## 2024-08-08 DIAGNOSIS — R079 Chest pain, unspecified: Secondary | ICD-10-CM

## 2024-08-08 DIAGNOSIS — Q245 Malformation of coronary vessels: Secondary | ICD-10-CM

## 2024-08-08 DIAGNOSIS — I2584 Coronary atherosclerosis due to calcified coronary lesion: Secondary | ICD-10-CM | POA: Diagnosis present

## 2024-08-08 DIAGNOSIS — R7989 Other specified abnormal findings of blood chemistry: Secondary | ICD-10-CM | POA: Diagnosis present

## 2024-08-08 DIAGNOSIS — Z8719 Personal history of other diseases of the digestive system: Secondary | ICD-10-CM

## 2024-08-08 DIAGNOSIS — K2270 Barrett's esophagus without dysplasia: Secondary | ICD-10-CM | POA: Diagnosis present

## 2024-08-08 DIAGNOSIS — R197 Diarrhea, unspecified: Secondary | ICD-10-CM

## 2024-08-08 DIAGNOSIS — Z7982 Long term (current) use of aspirin: Secondary | ICD-10-CM

## 2024-08-08 DIAGNOSIS — K2100 Gastro-esophageal reflux disease with esophagitis, without bleeding: Secondary | ICD-10-CM | POA: Diagnosis present

## 2024-08-08 DIAGNOSIS — F32A Depression, unspecified: Secondary | ICD-10-CM | POA: Diagnosis present

## 2024-08-08 DIAGNOSIS — R5383 Other fatigue: Secondary | ICD-10-CM | POA: Diagnosis present

## 2024-08-08 DIAGNOSIS — R001 Bradycardia, unspecified: Secondary | ICD-10-CM | POA: Diagnosis present

## 2024-08-08 DIAGNOSIS — I2542 Coronary artery dissection: Secondary | ICD-10-CM | POA: Diagnosis not present

## 2024-08-08 LAB — CBC
HCT: 36.8 % — ABNORMAL LOW (ref 39.0–52.0)
Hemoglobin: 12.5 g/dL — ABNORMAL LOW (ref 13.0–17.0)
MCH: 32.1 pg (ref 26.0–34.0)
MCHC: 34 g/dL (ref 30.0–36.0)
MCV: 94.4 fL (ref 80.0–100.0)
Platelets: 307 K/uL (ref 150–400)
RBC: 3.9 MIL/uL — ABNORMAL LOW (ref 4.22–5.81)
RDW: 13.2 % (ref 11.5–15.5)
WBC: 8.1 K/uL (ref 4.0–10.5)
nRBC: 0 % (ref 0.0–0.2)

## 2024-08-08 LAB — COMPREHENSIVE METABOLIC PANEL WITH GFR
ALT: 13 U/L (ref 0–44)
AST: 14 U/L — ABNORMAL LOW (ref 15–41)
Albumin: 3.9 g/dL (ref 3.5–5.0)
Alkaline Phosphatase: 43 U/L (ref 38–126)
Anion gap: 13 (ref 5–15)
BUN: 22 mg/dL (ref 8–23)
CO2: 23 mmol/L (ref 22–32)
Calcium: 8.6 mg/dL — ABNORMAL LOW (ref 8.9–10.3)
Chloride: 106 mmol/L (ref 98–111)
Creatinine, Ser: 1.32 mg/dL — ABNORMAL HIGH (ref 0.61–1.24)
GFR, Estimated: >60 mL/min (ref >60)
Glucose, Bld: 127 mg/dL — ABNORMAL HIGH (ref 70–99)
Potassium: 3.9 mmol/L (ref 3.5–5.1)
Sodium: 142 mmol/L (ref 135–145)
Total Bilirubin: 1.2 mg/dL (ref 0.0–1.2)
Total Protein: 6.4 g/dL — ABNORMAL LOW (ref 6.5–8.1)

## 2024-08-08 LAB — ECHOCARDIOGRAM COMPLETE
AR max vel: 3.03 cm2
AV Peak grad: 8.2 mmHg
Ao pk vel: 1.43 m/s
Area-P 1/2: 4.06 cm2
Height: 72 in
MV M vel: 1.81 m/s
MV Peak grad: 13.1 mmHg
P 1/2 time: 556 ms
S' Lateral: 3.3 cm
Weight: 3168 [oz_av]

## 2024-08-08 LAB — URINALYSIS, ROUTINE W REFLEX MICROSCOPIC
Bilirubin Urine: NEGATIVE
Glucose, UA: NEGATIVE mg/dL
Hgb urine dipstick: NEGATIVE
Ketones, ur: 20 mg/dL — AB
Leukocytes,Ua: NEGATIVE
Nitrite: NEGATIVE
Protein, ur: NEGATIVE mg/dL
Specific Gravity, Urine: >1.046 — ABNORMAL HIGH (ref 1.005–1.030)
pH: 5 (ref 5.0–8.0)

## 2024-08-08 LAB — TROPONIN I (HIGH SENSITIVITY)
Troponin I (High Sensitivity): 4 ng/L
Troponin I (High Sensitivity): 4 ng/L (ref <18)

## 2024-08-08 LAB — HEMOGLOBIN A1C
Hgb A1c MFr Bld: 4.8 % (ref 4.8–5.6)
Mean Plasma Glucose: 91.06 mg/dL

## 2024-08-08 LAB — D-DIMER, QUANTITATIVE: D-Dimer, Quant: 0.67 ug{FEU}/mL — ABNORMAL HIGH (ref 0.00–0.50)

## 2024-08-08 MED ORDER — PANTOPRAZOLE SODIUM 40 MG PO TBEC
40.0000 mg | DELAYED_RELEASE_TABLET | Freq: Two times a day (BID) | ORAL | Status: DC
  Administered 2024-08-08 – 2024-08-11 (×6): 40 mg via ORAL
  Filled 2024-08-08 (×6): qty 1

## 2024-08-08 MED ORDER — FREE WATER
500.0000 mL | Freq: Once | Status: AC
  Administered 2024-08-09: 500 mL via ORAL

## 2024-08-08 MED ORDER — ALPRAZOLAM 0.5 MG PO TABS: 0.5000 mg | ORAL_TABLET | Freq: Two times a day (BID) | ORAL | Status: DC | PRN

## 2024-08-08 MED ORDER — ENOXAPARIN SODIUM 40 MG/0.4ML IJ SOSY: 40.0000 mg | PREFILLED_SYRINGE | INTRAMUSCULAR | Status: DC

## 2024-08-08 MED ORDER — ACETAMINOPHEN 325 MG PO TABS
650.0000 mg | ORAL_TABLET | Freq: Four times a day (QID) | ORAL | Status: DC | PRN
  Administered 2024-08-08 – 2024-08-10 (×2): 650 mg via ORAL
  Filled 2024-08-08 (×2): qty 2

## 2024-08-08 MED ORDER — ATORVASTATIN CALCIUM 20 MG PO TABS
40.0000 mg | ORAL_TABLET | Freq: Every day | ORAL | Status: DC
  Administered 2024-08-08 – 2024-08-09 (×2): 40 mg via ORAL
  Filled 2024-08-08 (×2): qty 2

## 2024-08-08 MED ORDER — ASPIRIN 81 MG PO CHEW
324.0000 mg | CHEWABLE_TABLET | Freq: Once | ORAL | Status: AC
  Administered 2024-08-08: 324 mg via ORAL

## 2024-08-08 MED ORDER — SODIUM CHLORIDE (PF) 0.9 % IJ SOLN: 250.0000 mL | Freq: Once | INTRAMUSCULAR | Status: DC

## 2024-08-08 MED ORDER — ACETAMINOPHEN 650 MG RE SUPP: 650.0000 mg | Freq: Four times a day (QID) | RECTAL | Status: DC | PRN

## 2024-08-08 MED ORDER — NITROGLYCERIN 0.4 MG SL SUBL
0.4000 mg | SUBLINGUAL_TABLET | SUBLINGUAL | Status: DC | PRN
  Administered 2024-08-10: 0.4 mg via SUBLINGUAL
  Filled 2024-08-08: qty 1

## 2024-08-08 MED ORDER — ASPIRIN 325 MG PO TBEC
325.0000 mg | DELAYED_RELEASE_TABLET | Freq: Every day | ORAL | 0 refills | Status: DC
Start: 2024-08-08 — End: 2024-08-08

## 2024-08-08 MED ORDER — BUPROPION HCL ER (XL) 150 MG PO TB24
450.0000 mg | ORAL_TABLET | Freq: Every day | ORAL | Status: DC
  Filled 2024-08-08 (×3): qty 3

## 2024-08-08 MED ORDER — SENNOSIDES-DOCUSATE SODIUM 8.6-50 MG PO TABS: 1.0000 | ORAL_TABLET | Freq: Every evening | ORAL | Status: DC | PRN

## 2024-08-08 MED ORDER — SODIUM CHLORIDE 0.9% FLUSH
3.0000 mL | Freq: Two times a day (BID) | INTRAVENOUS | Status: DC
  Administered 2024-08-08 – 2024-08-11 (×5): 3 mL via INTRAVENOUS

## 2024-08-08 MED ORDER — IOHEXOL 350 MG/ML SOLN
75.0000 mL | Freq: Once | INTRAVENOUS | Status: AC | PRN
  Administered 2024-08-08: 75 mL via INTRAVENOUS

## 2024-08-08 MED ORDER — ASPIRIN 81 MG PO TBEC
81.0000 mg | DELAYED_RELEASE_TABLET | Freq: Every day | ORAL | Status: DC
  Administered 2024-08-09 – 2024-08-11 (×3): 81 mg via ORAL
  Filled 2024-08-08 (×3): qty 1

## 2024-08-08 MED ORDER — BISACODYL 5 MG PO TBEC: 5.0000 mg | DELAYED_RELEASE_TABLET | Freq: Every day | ORAL | Status: DC | PRN

## 2024-08-08 MED ORDER — VORTIOXETINE HBR 5 MG PO TABS
20.0000 mg | ORAL_TABLET | Freq: Every day | ORAL | Status: DC
  Administered 2024-08-09 – 2024-08-11 (×3): 20 mg via ORAL
  Filled 2024-08-08 (×3): qty 4

## 2024-08-08 MED ORDER — ASPIRIN 81 MG PO CHEW: 81.0000 mg | CHEWABLE_TABLET | ORAL | Status: AC

## 2024-08-08 MED ORDER — SODIUM CHLORIDE 0.9 % IV BOLUS: 250.0000 mL | Freq: Once | INTRAVENOUS | Status: DC

## 2024-08-08 MED ORDER — ALUM & MAG HYDROXIDE-SIMETH 200-200-20 MG/5ML PO SUSP: 30.0000 mL | Freq: Four times a day (QID) | ORAL | Status: DC | PRN

## 2024-08-08 NOTE — ED Triage Notes (Addendum)
 Pt comes via EMS from heart care with syncopal episode. Pt states recently found out week ago about heart condition. Pt needs heart cath. Pt at visit today and passed out. BP 71/42. Line was started and pt given fluids. EMs gave more fluids. BP last reading was 135/85  Pt is A&OX4  Pt states pressure in chest earlier. Pt denies any current pain.

## 2024-08-08 NOTE — ED Notes (Signed)
Report given to Camilla RN.

## 2024-08-08 NOTE — H&P (Addendum)
 History and Physical    Patient: James Arellano FMW:[id number] DOB: [birth year] DOA: 08/08/2024 DOS: the patient was seen and examined on 08/08/2024 PCP: Sharma Coyer, MD  Patient coming from: Home  Chief Complaint:  Chief Complaint  Patient presents with   Loss of Consciousness   HPI: [name] is a 63 y.o. male with medical history significant of hypertension, hyperlipidemia, depression/anxiety, family history of CAD, recent abnormal CTA coronary with plans for outpatient cardiac cath underway.  Patient was seen in cardiology clinic today for precath appointment, where patient had a syncopal episode that was preceded by diaphoresis, dizziness and lightheadedness.  Patient's BP at that time was reportedly 71/42, he was started on IV fluids and on arrival to the ED BP had improved to 135/85.  Patient reported an episode of chest pressure earlier.  EKG in the office was reportedly normal sinus rhythm at 66 bpm with T wave inversion in lead III.  Bedside echo was reported to show normal biventricular function and he was referred to the ED for further evaluation and management.    When seen at bedside, he reports still some slight chest pressure.  He took his usual medications this morning including 2 new meds-metoprolol  and Imdur .  He had not eaten anything since 5 PM yesterday.  Patient recently returned to a trip from [place] late Saturday night.  During his trip, he needed to use sublingual nitro twice for episodes of chest pain and reports it resolved his symptoms.  Denies radiation of chest pain.  He denies other recent injuries including fevers/chills, cough, congestion, sore throat, abdominal pain, nausea, vomiting, diarrhea, dysuria, hematuria, weakness numbness or tingling.  Of note, patient reports prolonged bleeding if any cuts.  ED course: Initial vitals temp 98.7 F, HR 53, RR 18, BP 125/78, SpO2 100% on room air. Labs were obtained including CMP, CBC that were  notable for nonfasting glucose 127, creatinine 1.32, hemoglobin 12.5.  At bedtime-troponins were normal at 4 twice.  D-dimer mildly elevated 0.67. Chest x-ray showed no acute abnormalities. CTA chest was negative for PE, showed advanced three-vessel coronary calcifications, 4 cm ascending aortic aneurysm and aortic atherosclerosis.  Patient is admitted to medicine service with cardiology consulted for further evaluation and management as outlined in detail below   Review of Systems: As mentioned in the history of present illness. All other systems reviewed and are negative.   Past Medical History:  Diagnosis Date   Depression    GERD (gastroesophageal reflux disease)    Hypertension    Past Surgical History:  Procedure Laterality Date   ESOPHAGOGASTRODUODENOSCOPY ENDOSCOPY  2015   Social History:  reports that he has never smoked. He has never used smokeless tobacco. He reports current alcohol use. He reports that he does not use drugs.  No Known Allergies  Family History  Problem Relation Age of Onset   Dementia Mother    Heart disease Father    Heart disease Brother 30   Heart disease Brother 85   Peptic Ulcer Disease Brother        removal of part if his esophagus.   GER disease Brother     Prior to Admission medications   Medication Sig Start Date End Date Taking? Authorizing Provider  ALPRAZolam  (XANAX ) 0.5 MG tablet TAKE 1 TABLET BY MOUTH TWICE  DAILY 07/05/24   Mozingo, Regina Nattalie, NP  amLODipine  (NORVASC ) 5 MG tablet Take 1 tablet (5 mg total) by mouth daily. 01/18/24   Simmons-Robinson, Coyer, MD  aspirin  EC 81 MG tablet Take 1 tablet (81 mg total) by mouth daily. Swallow whole. 07/29/24   Gollan, Timothy J, MD  buPROPion  (WELLBUTRIN  XL) 150 MG 24 hr tablet Take 3 tablets (450 mg total) by mouth daily. 05/16/24   Mozingo, Regina Nattalie, NP  isosorbide  mononitrate (IMDUR ) 30 MG 24 hr tablet Take 1 tablet (30 mg total) by mouth daily. 07/29/24 10/27/24  Gollan,  Timothy J, MD  lisinopril  (ZESTRIL ) 20 MG tablet Take 1 tablet (20 mg total) by mouth daily. 07/29/24   Gollan, Timothy J, MD  metoprolol  succinate (TOPROL -XL) 25 MG 24 hr tablet Take 1 tablet (25 mg total) by mouth daily. Take with or immediately following a meal. 07/29/24 10/27/24  Gollan, Timothy J, MD  metoprolol  tartrate (LOPRESSOR ) 100 MG tablet Take 1 tablet (100 mg total) by mouth once for 1 dose. Take 90-120 minutes prior to scan. Hold for SBP less than 110. 07/22/24 08/08/24  Gollan, Timothy J, MD  nitroGLYCERIN  (NITROSTAT ) 0.4 MG SL tablet Place 1 tablet (0.4 mg total) under the tongue every 5 (five) minutes as needed for chest pain. 07/29/24 10/27/24  Gollan, Timothy J, MD  omeprazole (PRILOSEC) 40 MG capsule Take 40 mg by mouth 2 (two) times daily.    [provider]  sildenafil  (VIAGRA ) 50 MG tablet TAKE ONE TABLET BY MOUTH ONE TIME DAILY AS NEEDED FOR ERECTILE DYSFUNCTION 07/23/22   Bertrum Charlie CROME, MD  vortioxetine  HBr (TRINTELLIX ) 20 MG TABS tablet Take 1 tablet (20 mg total) by mouth daily. 11/16/23   Mozingo, Regina Nattalie, NP    Physical Exam: Vitals:   08/08/24 1131 08/08/24 1132  BP:  125/78  Pulse:  (!) 53  Resp:  18  Temp:  98.7 F (37.1 C)  SpO2:  100%  Weight: 89.8 kg   Height: 6' (1.829 m)    General exam: awake, alert, no acute distress HEENT: atraumatic, clear conjunctiva, anicteric sclera, moist mucus membranes, hearing grossly normal  Respiratory system: CTAB, no wheezes, rales or rhonchi, normal respiratory effort. Cardiovascular system: normal S1/S2, RRR, no JVD, murmurs, rubs, gallops,  no pedal edema.   Gastrointestinal system: soft, NT, ND, no HSM felt, +bowel sounds. Central nervous system: A&O x4. no gross focal neurologic deficits, normal speech Extremities: moves all , no edema, normal tone Skin: dry, intact, normal temperature, normal color, No rashes, lesions or ulcers Psychiatry: normal mood, congruent affect, judgement and insight  appear normal    Data Reviewed:  As reviewed in detail above  Assessment and Plan:  Syncope --most likely due to hypotension  Unstable angina Coronary artery disease - Recent CTA coronary showed severe CAD with positive FFR in the LAD -- [facility] cardiology is consulted -- Plan is for cardiac cath tomorrow -- Continue aspirin  -- Started Lipitor 40 mg daily -- Follow lipid panel -- Hold metoprolol  with bradycardia -- Will also hold Imdur  and lisinopril  -- Repeat EKG and troponin if chest pain -- As needed SL nitroglycerin  -- Complete echo pending -- Monitor on telemetry  Reported history of prolonged bleeding -no diagnosis of bleeding diathesis but patient and wife report that he seems to bleed for prolonged time even after a minor cut or injury. -- Will check PT/INR, APTT and platelet function assay  Essential hypertension -now admitted with syncope secondary to hypotension, likely from multiple cardiac meds, metoprolol  and Imdur  being relatively new. -- Antihypertensives are on hold as above for now  Hyperlipidemia --started on Lipitor 40 mg daily  Dilated aortic root /ascending  aortic aneurysm -- Noted on CTA chest: 4.0 cm -- Recommend surveillance with annual imaging  Depression/anxiety -- Resume home Wellbutrin , Trintellix  Xanax  as needed  History of Barrett's esophagus  -- On twice daily PPI    Advance Care Planning: CODE STATUS full code  Consults: [facility] cardiology  Family Communication: Significant other at bedside  Severity of Illness: The appropriate patient status for this patient is OBSERVATION. Observation status is judged to be reasonable and necessary in order to provide the required intensity of service to ensure the patient's safety. The patient's presenting symptoms, physical exam findings, and initial radiographic and laboratory data in the context of their medical condition is felt to place them at decreased risk for further clinical deterioration.  Furthermore, it is anticipated that the patient will be medically stable for discharge from the hospital within 2 midnights of admission.   Author: Burnard DELENA Cunning, DO 08/08/2024 1:23 PM  For on call review [email or web address].

## 2024-08-08 NOTE — Progress Notes (Signed)
 Echocardiogram 2D Echocardiogram has been performed.  James Arellano 08/08/2024, 4:49 PM

## 2024-08-08 NOTE — Consult Note (Signed)
 Cardiology Consultation   Patient ID: [name] MRN: [id number]; DOB: Oct 08, 1961  Admit date: 08/08/2024 Date of Consult: 08/08/2024  PCP:  Sharma Coyer, MD   [facility] Providers Cardiologist:  Evalene Lunger, MD        Patient Profile: James Arellano is a 63 y.o. male with a hx of depression/anxiety, hyperlipidemia, family history of coronary atherosclerosis, coronary artery disease with abnormal CTA, hypertension, who is being seen 08/08/2024 for the evaluation of syncope at the request of Dr. Fausto.  History of Present Illness: Mr. James Arellano was previously seen in in clinic 07/22/2020 as a new patient for chest pain.  Coronary calcium score was 1899, severe CAD with 70 to 99% mixed plaques in the proximal LAD, 25-49% mixed plaque in the proximal R1 segment, 1 to 24% plaque in the ostial proximal left circumflex segment, 50 to 69% calcified plaque proximal RCA.  CT FFR showed significant stenosis in the proximal LAD.  Patient was notified and cardiac catheterization was recommended.  However patient had vacation plans and cardiac medications were called in.  He was started on Toprol -XL 25 Mg daily, Imdur  30 Mg daily, Aspirin  81 Mg daily and Sublingual Nitroglycerin .  Lisinopril  was decreased to 20 Mg daily.   He was evaluated in clinic today reporting continued chest pain episodes last week while he was in breath.  He stated he had taken sublingual nitro twice with improvement in his chest discomfort.  1 time he was in a museum and it was very hot daily.  And nitric.  The second time he was on the plane coming home.  He described his symptoms as chest pressure with associated shortness of breath.  There was no nausea or vomiting.  There was no radiation.  During his visit in clinic he became dizzy and lightheaded.  He was laid back on the exam table and was noted with blood pressure 92/62 he was diaphoretic and subsequently had a syncopal episode.  He very  quickly came to and epi was started and he was given a 250 cc bolus of normal saline.  He continued to deny any chest pain.  EKG revealed sinus rhythm with a rate of 66 with T wave inversions in lead III.  He he was given aspirin  324 mg.  Blood pressure improved to 136/77.  Bedside echocardiogram showed normal BiV function.  He was started on 500 cc bolus and EMS was called.  He continued to remain chest pain-free.  He was taken to the emergency department for further evaluation.  He arrived to the emergency department via EMS due to hypotension and a syncopal episode.  States that he had chest pressure earlier but denied any current chest pain.  Given additional fluids by EMS.  Initial vital signs upon arrival to the emergency department revealed a blood pressure 125/70, pulse 53, respirations of 18, temperature 98.7.  Labs revealed blood glucose of 127, serum creatinine 1.32, calcium 8.6, hemoglobin 12.5, D-dimer 0.67.  CTA of the chest negative for PE, 4.0 cm ascending aortic aneurysm noted with recommendation for annual imaging.  No additional acute findings.  Cardiology was consulted to assist with ongoing management for syncope and abnormal coronary CTA.  Past Medical History:  Diagnosis Date   Depression    GERD (gastroesophageal reflux disease)    Hypertension     Past Surgical History:  Procedure Laterality Date   ESOPHAGOGASTRODUODENOSCOPY ENDOSCOPY  2015     Home Medications:  Prior to Admission medications   Medication  Sig Start Date End Date Taking? Authorizing Provider  ALPRAZolam  (XANAX ) 0.5 MG tablet TAKE 1 TABLET BY MOUTH TWICE  DAILY 07/05/24  Yes Mozingo, Regina Nattalie, NP  amLODipine  (NORVASC ) 5 MG tablet Take 1 tablet (5 mg total) by mouth daily. 01/18/24  Yes Simmons-Robinson, Makiera, MD  aspirin  EC 81 MG tablet Take 1 tablet (81 mg total) by mouth daily. Swallow whole. 07/29/24  Yes Gollan, Timothy J, MD  isosorbide  mononitrate (IMDUR ) 30 MG 24 hr tablet Take 1 tablet  (30 mg total) by mouth daily. 07/29/24 10/27/24 Yes Gollan, Timothy J, MD  lisinopril  (ZESTRIL ) 20 MG tablet Take 1 tablet (20 mg total) by mouth daily. 07/29/24  Yes Gollan, Timothy J, MD  lisinopril  (ZESTRIL ) 40 MG tablet Take 40 mg by mouth daily. 07/29/24  Yes [provider]  metoprolol  succinate (TOPROL -XL) 25 MG 24 hr tablet Take 1 tablet (25 mg total) by mouth daily. Take with or immediately following a meal. 07/29/24 10/27/24 Yes Gollan, Timothy J, MD  omeprazole (PRILOSEC) 40 MG capsule Take 40 mg by mouth 2 (two) times daily.   Yes [provider]  vortioxetine  HBr (TRINTELLIX ) 20 MG TABS tablet Take 1 tablet (20 mg total) by mouth daily. 11/16/23  Yes Mozingo, Regina Nattalie, NP  buPROPion  (WELLBUTRIN  XL) 150 MG 24 hr tablet Take 3 tablets (450 mg total) by mouth daily. 05/16/24   Mozingo, Regina Nattalie, NP  metoprolol  tartrate (LOPRESSOR ) 100 MG tablet Take 1 tablet (100 mg total) by mouth once for 1 dose. Take 90-120 minutes prior to scan. Hold for SBP less than 110. Patient not taking: Reported on 08/08/2024 07/22/24 08/08/24  Gollan, Timothy J, MD  nitroGLYCERIN  (NITROSTAT ) 0.4 MG SL tablet Place 1 tablet (0.4 mg total) under the tongue every 5 (five) minutes as needed for chest pain. 07/29/24 10/27/24  Gollan, Timothy J, MD  sildenafil  (VIAGRA ) 50 MG tablet TAKE ONE TABLET BY MOUTH ONE TIME DAILY AS NEEDED FOR ERECTILE DYSFUNCTION Patient not taking: Reported on 08/08/2024 07/23/22   Bertrum Charlie CROME, MD    Scheduled Meds:  NOREEN ON 08/09/2024] aspirin  EC  81 mg Oral Daily   atorvastatin  40 mg Oral Daily   buPROPion   450 mg Oral Daily   pantoprazole  40 mg Oral BID   promethazine   25 mg Oral Once   sodium chloride flush  3 mL Intravenous Q12H   [START ON 08/09/2024] vortioxetine  HBr  20 mg Oral Daily   Continuous Infusions:  sodium chloride     PRN Meds: acetaminophen **OR** acetaminophen, ALPRAZolam , alum & mag hydroxide-simeth, bisacodyl,  diphenoxylate -atropine , diphenoxylate -atropine , nitroGLYCERIN , promethazine , senna-docusate  Allergies:   No Known Allergies  Social History:   Social History   Socioeconomic History   Marital status: Single    Spouse name: single   Number of children: 2   Years of education: 19   Highest education level: Master's degree (e.g., MA, MS, MEng, MEd, MSW, MBA)  Occupational History   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  Tobacco Use   Smoking status: Never   Smokeless tobacco: Never  Substance and Sexual Activity   Alcohol use: Yes    Comment: wine ususally on the weekend   Drug use: No   Sexual activity: Not Currently  Other Topics Concern   Not on file  Social History Narrative   Not on file   Social Drivers of Health   Financial Resource Strain: Patient Declined (07/18/2024)   Overall Financial Resource Strain (CARDIA)    Difficulty  of Paying Living Expenses: Patient declined  Food Insecurity: Patient Declined (07/18/2024)   Hunger Vital Sign    Worried About Running Out of Food in the Last Year: Patient declined    Ran Out of Food in the Last Year: Patient declined  Transportation Needs: Patient Declined (07/18/2024)   PRAPARE - Administrator, Civil Service (Medical): Patient declined    Lack of Transportation (Non-Medical): Patient declined  Physical Activity: Unknown (07/18/2024)   Exercise Vital Sign    Days of Exercise per Week: Patient declined    Minutes of Exercise per Session: Not on file  Stress: Patient Declined (07/18/2024)   [employer] of Occupational Health - Occupational Stress Questionnaire    Feeling of Stress: Patient declined  Social Connections: Unknown (07/18/2024)   Social Connection and Isolation Panel    Frequency of Communication with Friends and Family: Patient declined    Frequency of Social Gatherings with Friends and Family: Patient declined    Attends Religious Services: Patient declined    Loss adjuster, chartered or Organizations: Patient declined    Attends Engineer, structural: Not on file    Marital Status: Patient declined  Catering manager Violence: Not on file    Family History:    Family History  Problem Relation Age of Onset   Dementia Mother    Heart disease Father    Heart disease Brother 30   Heart disease Brother 48   Peptic Ulcer Disease Brother        removal of part if his esophagus.   GER disease Brother      ROS:  Please see the history of present illness.  Review of Systems  Constitutional:  Positive for malaise/fatigue.  Neurological:  Positive for dizziness and weakness.    All other ROS reviewed and negative.     Physical Exam/Data: Vitals:   08/08/24 1131 08/08/24 1132  BP:  125/78  Pulse:  (!) 53  Resp:  18  Temp:  98.7 F (37.1 Arellano)  SpO2:  100%  Weight: 89.8 kg   Height: 6' (1.829 m)    No intake or output data in the 24 hours ending 08/08/24 1547    08/08/2024   11:31 AM 08/08/2024   10:18 AM 07/22/2024    3:08 PM  Last 3 Weights  Weight (lbs) 198 lb 198 lb 9.6 oz 199 lb 9.6 oz  Weight (kg) 89.812 kg 90.084 kg 90.538 kg     Body mass index is 26.85 kg/m.  General:  Well nourished, well developed, in no acute distress HEENT: normal Neck: no JVD Vascular: No carotid bruits; Distal pulses 2+ bilaterally Cardiac:  normal S1, S2; RRR; no murmur  Lungs:  clear to auscultation bilaterally, no wheezing, rhonchi or rales  Abd: soft, nontender, no hepatomegaly  Ext: no edema Musculoskeletal:  No deformities, BUE and BLE strength normal and equal Skin: warm and dry  Neuro:  CNs 2-12 intact, no focal abnormalities noted Psych:  Normal affect   EKG:  The EKG was personally reviewed and demonstrates: Sinus bradycardia with a rate of 55 and a left axis deviation and LVH with early repolarization.  Telemetry:  Telemetry was personally reviewed and demonstrates:  sinus brady rates in the 50's  Relevant CV Studies:  Echocardiogram  ordered and pending  Cardiac CTA 07/2024  IMPRESSION: 1. Coronary calcium score of 1899. This was 98th percentile for age and sex matched control.   2. Normal coronary origins with co-dominance.  3. CAD-RADS 4 Severe CAD.   4. 70-99% mixed plaque in proximal LAD (aneurysmal segment).   5. 25-49% mixed plaque in proximal RI segment.   6. 1-24% calcified plaque in ostial and proximal LCx segment.   7. 50-69% calcified plaque in proximal RCA. See above for details.   8. CT FFR will be performed and reported separately.   9. Evidence of ascending aortic dilation, 42.94mm. Consider secondary imaging modality (echocardiogram, CTA Aorta Protocol, MRA Aorta Protocol) in 6 months if clinically indicated.   10.  Aortic atherosclerosis.   RECOMMENDATION: Await results of CTFFR. FFR: IMPRESSION: 1.  CT FFR analysis showed significant stenosis proximal LAD.   2. CT-FFR valves in the diagonal branch are in the indeterminate zone. Its likely a combination of both native disease and flow degradation due to LAD disease. Recommend invasive hemodynamics to evaluate if its hemodynamically significant.   RECOMMENDATIONS: Invasive angiography recommended if no contraindications.    Laboratory Data: High Sensitivity Troponin:   Recent Labs  Lab 08/08/24 1135 08/08/24 1330  TROPONINIHS 4 4     Chemistry Recent Labs  Lab 08/08/24 1135  NA 142  K 3.9  CL 106  CO2 23  GLUCOSE 127*  BUN 22  CREATININE 1.32*  CALCIUM 8.6*  GFRNONAA >60  ANIONGAP 13    Recent Labs  Lab 08/08/24 1135  PROT 6.4*  ALBUMIN 3.9  AST 14*  ALT 13  ALKPHOS 43  BILITOT 1.2   Lipids No results for input(s): CHOL, TRIG, HDL, LABVLDL, LDLCALC, CHOLHDL in the last 168 hours.  Hematology Recent Labs  Lab 08/08/24 1135  WBC 8.1  RBC 3.90*  HGB 12.5*  HCT 36.8*  MCV 94.4  MCH 32.1  MCHC 34.0  RDW 13.2  PLT 307   Thyroid No results for input(s): TSH, FREET4 in the last 168  hours.  BNPNo results for input(s): BNP, PROBNP in the last 168 hours.  DDimer  Recent Labs  Lab 08/08/24 1135  DDIMER 0.67*    Radiology/Studies:  CT Angio Chest PE W/Cm &/Or Wo Cm Result Date: 08/08/2024 CLINICAL DATA:  Syncope. Low to intermediate probability for pulmonary embolus. EXAM: CT ANGIOGRAPHY CHEST WITH CONTRAST TECHNIQUE: Multidetector CT imaging of the chest was performed using the standard protocol during bolus administration of intravenous contrast. Multiplanar CT image reconstructions and MIPs were obtained to evaluate the vascular anatomy. RADIATION DOSE REDUCTION: This exam was performed according to the departmental dose-optimization program which includes automated exposure control, adjustment of the mA and/or kV according to patient size and/or use of iterative reconstruction technique. CONTRAST:  75mL OMNIPAQUE  IOHEXOL  350 MG/ML SOLN COMPARISON:  None Available. FINDINGS: Cardiovascular: Negative for pulmonary embolus. Atherosclerotic calcification of the aorta with age advanced involvement of all 3 coronary arteries. Ascending aorta measures 4.0 cm (coronal image 59). Heart is at the upper limits of normal in size to mildly enlarged. No pericardial effusion. Mediastinum/Nodes: No pathologically enlarged mediastinal or left hilar lymph nodes. Borderline enlarged 1.4 cm right hilar lymph node, nonspecific. No axillary adenopathy. Bilateral gynecomastia. Esophagus is grossly unremarkable. Lungs/Pleura: Minimal dependent atelectasis bilaterally. No pleural fluid. Airway is unremarkable. Upper Abdomen: Visualized portions of the liver, gallbladder, adrenal glands, kidneys, spleen, pancreas, stomach and bowel are grossly unremarkable. No upper abdominal adenopathy. Musculoskeletal: Degenerative changes in the spine. Review of the MIP images confirms the above findings. IMPRESSION: 1. Negative for pulmonary embolus. 2.  Age advanced three-vessel coronary artery calcification. 3. 4.0  cm ascending aortic aneurysm. Recommend annual imaging followup by  CTA or MRA. This recommendation follows 2010 ACCF/AHA/AATS/ACR/ASA/SCA/SCAI/SIR/STS/SVM Guidelines for the Diagnosis and Management of Patients with Thoracic Aortic Disease. Circulation. 2010; 121: Z733-z630. Aortic aneurysm NOS (ICD10-I71.9). 4.  Aortic atherosclerosis (ICD10-I70.0). Electronically Signed   By: Newell Eke M.D.   On: 08/08/2024 14:20   DG Chest 1 View Result Date: 08/08/2024 EXAM: 1 VIEW(S) XRAY OF THE CHEST 08/08/2024 11:51:00 AM COMPARISON: None available. CLINICAL HISTORY: cp. Syncopal episode today. Hx of HTN. Per ED note, patient needs heart cath. FINDINGS: LUNGS AND PLEURA: No focal pulmonary opacity. No pulmonary edema. No pleural effusion. No pneumothorax. HEART AND MEDIASTINUM: No acute abnormality of the cardiac and mediastinal silhouettes. BONES AND SOFT TISSUES: No acute osseous abnormality. IMPRESSION: 1. No acute abnormalities. Electronically signed by: Ryan Salvage MD 08/08/2024 12:45 PM EDT RP Workstation: [id number]     Assessment and Plan: Coronary artery disease/unstable angina/syncope -Recent coronary CTA with severe CAD with positive FFR in the LAD -Recommend cardiac catheterization tomorrow -Continue aspirin  -Started on atorvastatin 40 mg daily -Syncopal episode likely secondary to hypotension -PTA Toprol  on hold due to baseline bradycardia -PTA Imdur  and lisinopril  remain on hold -EKG completed in clinic today revealed T wave inversions in lead III -Patient continues to remain chest pain-free -Continue with telemetry monitoring -Echocardiogram ordered and pending with further recommendations to follow -EKG as needed for pain no changes  Dehydration -Elevated serum creatinine 1.3 -Received 250 cc bolus normal saline, 500 cc bolus of normal saline thus far -Recommend gentle hydration -Daily BMP  Dilated aortic root/ascending aortic aneurysm -42.8 mm on coronary CTA,  recommend follow-up in 6 months -CT chest revealing 4.0 cm ascending aortic aneurysm with recommendation of annual imaging  Hyperlipidemia -LDL 175 - Started on atorvastatin 40 mg daily -Previously was not on statin therapy  Hypertension -Blood pressure 125/78 -PTA lisinopril  and Toprol  and Imdur  currently on hold -Vital signs per unit protocol  Anxiety -Ongoing management per IM   Risk Assessment/Risk Scores:       Informed Consent   Shared Decision Making/Informed Consent The risks [stroke (1 in 1000), death (1 in 1000), kidney failure [usually temporary] (1 in 500), bleeding (1 in 200), allergic reaction [possibly serious] (1 in 200)], benefits (diagnostic support and management of coronary artery disease) and alternatives of a cardiac catheterization were discussed in detail with Mr. Ancheta and he is willing to proceed.           For questions or updates, please contact [facility] Please consult [email or web address] for contact info under      Signed, Creedon Danielski, NP  08/08/2024 3:47 PM

## 2024-08-08 NOTE — ED Provider Notes (Signed)
 [facility] Provider Note    Event Date/Time   First MD Initiated Contact with Patient 08/08/24 1129     (approximate)   History   Loss of Consciousness   HPI  CAMBREN HELM is a 63 y.o. male with history of depression, GERD, hypertension, presenting with syncopal episode.  States that he did not eat this morning but took his blood pressure medications.  Has noted intermittent chest pain and shortness of breath prior to going to [place].  States that shortness of breath is mild, he notes that right now.  States that when he was at the cardiologist office, they were discussing scheduling a left heart cath.  States that he had some chest pain, felt very nauseous and lightheaded and passed out.  Denies leg swelling, no infectious symptoms.  Denies any chest pain right now.  On independent chart review, he was seen by cardiology today for chest pain follow-up, patient reported having chest pain episodes over the past week when he was in room, took sublingual nitro twice with improvement to chest pain.  Had some associated shortness of breath with the chest pain.  During patient visit, he became very lightheaded, placed in supine position, blood pressures were in the 90s.  He was also diaphoretic but no chest pain.  He was given a couple sips of water before he passed out.  He was given 250 cc IV bolus.  EKG at that time showed sinus rhythm with T wave inversions to lead III.  He was given a full dose aspirin .  Repeat blood pressures were stable.  Bedside ultrasound showed normal contractility.  He was given additional fluid bolus by EMS.  Patient had reported that he did not eat in the morning when he took his blood pressure medications.  He had a CT angio done in September that showed a coronary calcium score 1899.  He had severe CAD.  As well as ascending aortic dilatation to 4.2 cm.  He was recommended for cardiac cath but he went on 1 week vacation.     Physical Exam    Triage Vital Signs: ED Triage Vitals  Encounter Vitals Group     BP      Girls Systolic BP Percentile      Girls Diastolic BP Percentile      Boys Systolic BP Percentile      Boys Diastolic BP Percentile      Pulse      Resp      Temp      Temp src      SpO2      Weight      Height      Head Circumference      Peak Flow      Pain Score      Pain Loc      Pain Education      Exclude from Growth Chart     Most recent vital signs: Vitals:   08/08/24 1132  BP: 125/78  Pulse: (!) 53  Resp: 18  Temp: 98.7 F (37.1 C)  SpO2: 100%     General: Awake, no distress.  CV:  Good peripheral perfusion.  Resp:  Normal effort.  No tachypnea or respiratory distress Abd:  No distention.  Soft nontender Other:  No unilateral calf swelling or tenderness, no lower extremity edema.   ED Results / Procedures / Treatments   Labs (all labs ordered are listed, but only abnormal results are displayed)  Labs Reviewed  COMPREHENSIVE METABOLIC PANEL WITH GFR - Abnormal; Notable for the following components:      Result Value   Glucose, Bld 127 (*)    Creatinine, Ser 1.32 (*)    Calcium 8.6 (*)    Total Protein 6.4 (*)    AST 14 (*)    All other components within normal limits  CBC - Abnormal; Notable for the following components:   RBC 3.90 (*)    Hemoglobin 12.5 (*)    HCT 36.8 (*)    All other components within normal limits  D-DIMER, QUANTITATIVE - Abnormal; Notable for the following components:   D-Dimer, Quant 0.67 (*)    All other components within normal limits  URINALYSIS, ROUTINE W REFLEX MICROSCOPIC  CBG MONITORING, ED  TROPONIN I (HIGH SENSITIVITY)  TROPONIN I (HIGH SENSITIVITY)     EKG  EKG shows, sinus rhythm, rate 55, normal QRS, normal QTc, no obvious ischemic ST elevation, T wave inversion to 3, not significantly changed compared to prior   RADIOLOGY On my independent interpretation, chest x-ray without obvious consolidation   PROCEDURES:  Critical  Care performed: No  Procedures   MEDICATIONS ORDERED IN ED: Medications  iohexol  (OMNIPAQUE ) 350 MG/ML injection 75 mL (has no administration in time range)     IMPRESSION / MDM / ASSESSMENT AND PLAN / ED COURSE  I reviewed the triage vital signs and the nursing notes.                              Differential diagnosis includes, but is not limited to, angina, ACS, arrhythmia, electrolyte derangement, vasovagal, dehydration, also consider PE given his recent trip to room, he did note that the shortness of breath had preceded his trip.  No unilateral calf swelling or tenderness, No Hypoxia.  Will Get D-Dimer, Labs, EKG, Troponin, Chest X-Ray.  He Will Likely Need to Be Admitted.  Patient's presentation is most consistent with acute presentation with potential threat to life or bodily function.  Independent interpretation of labs and imaging below.  Given history of severe CAD, syncopal episode and chest pain earlier today at cardiologist office, patient is high risk and will need to be admitted for further management.  He CT PE is pending.  Consult hospitalist was agreeable with plan for admission and will evaluate the patient.  He is admitted.  The patient is on the cardiac monitor to evaluate for evidence of arrhythmia and/or significant heart rate changes.   Clinical Course as of 08/08/24 1330  Mon Aug 08, 2024  1230 Independent review of labs, pain is mildly elevated, this is downtrending compared to prior, electrolytes not severely deranged, LFTs are not elevated, D-dimer is elevated, troponin is negative, no leukocytosis.  Will get a CT PE study. [TT]  1329 DG Chest 1 View 1. No acute abnormalities.  [TT]    Clinical Course User Index [TT] Waymond Lorelle Cummins, MD     FINAL CLINICAL IMPRESSION(S) / ED DIAGNOSES   Final diagnoses:  Dehydration  Chest pain, unspecified type  Syncope, unspecified syncope type  Lightheadedness     Rx / DC Orders   ED Discharge Orders      None        Note:  This document was prepared using Dragon voice recognition software and may include unintentional dictation errors.    Waymond Lorelle Cummins, MD 08/08/24 1330

## 2024-08-08 NOTE — H&P (View-Only) (Signed)
 Cardiology Consultation   Patient ID: [name] MRN: [id number]; DOB: Oct 08, 1961  Admit date: 08/08/2024 Date of Consult: 08/08/2024  PCP:  James Coyer, MD   [facility] Providers Cardiologist:  James Lunger, MD        Patient Profile: James Arellano is a 63 y.o. male with a hx of depression/anxiety, hyperlipidemia, family history of coronary atherosclerosis, coronary artery disease with abnormal CTA, hypertension, who is being seen 08/08/2024 for the evaluation of syncope at the request of James Arellano.  History of Present Illness: James Arellano was previously seen in in clinic 07/22/2020 as a new patient for chest pain.  Coronary calcium score was 1899, severe CAD with 70 to 99% mixed plaques in the proximal LAD, 25-49% mixed plaque in the proximal R1 segment, 1 to 24% plaque in the ostial proximal left circumflex segment, 50 to 69% calcified plaque proximal RCA.  CT FFR showed significant stenosis in the proximal LAD.  Patient was notified and cardiac catheterization was recommended.  However patient had vacation plans and cardiac medications were called in.  He was started on Toprol -XL 25 Mg daily, Imdur  30 Mg daily, Aspirin  81 Mg daily and Sublingual Nitroglycerin .  Lisinopril  was decreased to 20 Mg daily.   He was evaluated in clinic today reporting continued chest pain episodes last week while he was in breath.  He stated he had taken sublingual nitro twice with improvement in his chest discomfort.  1 time he was in a museum and it was very hot daily.  And nitric.  The second time he was on the plane coming home.  He described his symptoms as chest pressure with associated shortness of breath.  There was no nausea or vomiting.  There was no radiation.  During his visit in clinic he became dizzy and lightheaded.  He was laid back on the exam table and was noted with blood pressure 92/62 he was diaphoretic and subsequently had a syncopal episode.  He very  quickly came to and epi was started and he was given a 250 cc bolus of normal saline.  He continued to deny any chest pain.  EKG revealed sinus rhythm with a rate of 66 with T wave inversions in lead III.  He he was given aspirin  324 mg.  Blood pressure improved to 136/77.  Bedside echocardiogram showed normal BiV function.  He was started on 500 cc bolus and EMS was called.  He continued to remain chest pain-free.  He was taken to the emergency department for further evaluation.  He arrived to the emergency department via EMS due to hypotension and a syncopal episode.  States that he had chest pressure earlier but denied any current chest pain.  Given additional fluids by EMS.  Initial vital signs upon arrival to the emergency department revealed a blood pressure 125/70, pulse 53, respirations of 18, temperature 98.7.  Labs revealed blood glucose of 127, serum creatinine 1.32, calcium 8.6, hemoglobin 12.5, D-dimer 0.67.  CTA of the chest negative for PE, 4.0 cm ascending aortic aneurysm noted with recommendation for annual imaging.  No additional acute findings.  Cardiology was consulted to assist with ongoing management for syncope and abnormal coronary CTA.  Past Medical History:  Diagnosis Date   Depression    GERD (gastroesophageal reflux disease)    Hypertension     Past Surgical History:  Procedure Laterality Date   ESOPHAGOGASTRODUODENOSCOPY ENDOSCOPY  2015     Home Medications:  Prior to Admission medications   Medication  Sig Start Date End Date Taking? Authorizing Provider  ALPRAZolam  (XANAX ) 0.5 MG tablet TAKE 1 TABLET BY MOUTH TWICE  DAILY 07/05/24  Yes Mozingo, Regina Nattalie, NP  amLODipine  (NORVASC ) 5 MG tablet Take 1 tablet (5 mg total) by mouth daily. 01/18/24  Yes Simmons-Robinson, Makiera, MD  aspirin  EC 81 MG tablet Take 1 tablet (81 mg total) by mouth daily. Swallow whole. 07/29/24  Yes Gollan, Timothy J, MD  isosorbide  mononitrate (IMDUR ) 30 MG 24 hr tablet Take 1 tablet  (30 mg total) by mouth daily. 07/29/24 10/27/24 Yes Gollan, Timothy J, MD  lisinopril  (ZESTRIL ) 20 MG tablet Take 1 tablet (20 mg total) by mouth daily. 07/29/24  Yes Gollan, Timothy J, MD  lisinopril  (ZESTRIL ) 40 MG tablet Take 40 mg by mouth daily. 07/29/24  Yes [provider]  metoprolol  succinate (TOPROL -XL) 25 MG 24 hr tablet Take 1 tablet (25 mg total) by mouth daily. Take with or immediately following a meal. 07/29/24 10/27/24 Yes Gollan, Timothy J, MD  omeprazole (PRILOSEC) 40 MG capsule Take 40 mg by mouth 2 (two) times daily.   Yes [provider]  vortioxetine  HBr (TRINTELLIX ) 20 MG TABS tablet Take 1 tablet (20 mg total) by mouth daily. 11/16/23  Yes Mozingo, Regina Nattalie, NP  buPROPion  (WELLBUTRIN  XL) 150 MG 24 hr tablet Take 3 tablets (450 mg total) by mouth daily. 05/16/24   Mozingo, Regina Nattalie, NP  metoprolol  tartrate (LOPRESSOR ) 100 MG tablet Take 1 tablet (100 mg total) by mouth once for 1 dose. Take 90-120 minutes prior to scan. Hold for SBP less than 110. Patient not taking: Reported on 08/08/2024 07/22/24 08/08/24  Gollan, Timothy J, MD  nitroGLYCERIN  (NITROSTAT ) 0.4 MG SL tablet Place 1 tablet (0.4 mg total) under the tongue every 5 (five) minutes as needed for chest pain. 07/29/24 10/27/24  Gollan, Timothy J, MD  sildenafil  (VIAGRA ) 50 MG tablet TAKE ONE TABLET BY MOUTH ONE TIME DAILY AS NEEDED FOR ERECTILE DYSFUNCTION Patient not taking: Reported on 08/08/2024 07/23/22   James Charlie CROME, MD    Scheduled Meds:  James Arellano ON 08/09/2024] aspirin  EC  81 mg Oral Daily   atorvastatin  40 mg Oral Daily   buPROPion   450 mg Oral Daily   pantoprazole  40 mg Oral BID   promethazine   25 mg Oral Once   sodium chloride flush  3 mL Intravenous Q12H   [START ON 08/09/2024] vortioxetine  HBr  20 mg Oral Daily   Continuous Infusions:  sodium chloride     PRN Meds: acetaminophen **OR** acetaminophen, ALPRAZolam , alum & mag hydroxide-simeth, bisacodyl,  diphenoxylate -atropine , diphenoxylate -atropine , nitroGLYCERIN , promethazine , senna-docusate  Allergies:   No Known Allergies  Social History:   Social History   Socioeconomic History   Marital status: Single    Spouse name: single   Number of children: 2   Years of education: 19   Highest education level: Master's degree (e.g., MA, MS, MEng, MEd, MSW, MBA)  Occupational History   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  Tobacco Use   Smoking status: Never   Smokeless tobacco: Never  Substance and Sexual Activity   Alcohol use: Yes    Comment: wine ususally on the weekend   Drug use: No   Sexual activity: Not Currently  Other Topics Concern   Not on file  Social History Narrative   Not on file   Social Drivers of Health   Financial Resource Strain: Patient Declined (07/18/2024)   Overall Financial Resource Strain (CARDIA)    Difficulty  of Paying Living Expenses: Patient declined  Food Insecurity: Patient Declined (07/18/2024)   Hunger Vital Sign    Worried About Running Out of Food in the Last Year: Patient declined    Ran Out of Food in the Last Year: Patient declined  Transportation Needs: Patient Declined (07/18/2024)   PRAPARE - Administrator, Civil Service (Medical): Patient declined    Lack of Transportation (Non-Medical): Patient declined  Physical Activity: Unknown (07/18/2024)   Exercise Vital Sign    Days of Exercise per Week: Patient declined    Minutes of Exercise per Session: Not on file  Stress: Patient Declined (07/18/2024)   [employer] of Occupational Health - Occupational Stress Questionnaire    Feeling of Stress: Patient declined  Social Connections: Unknown (07/18/2024)   Social Connection and Isolation Panel    Frequency of Communication with Friends and Family: Patient declined    Frequency of Social Gatherings with Friends and Family: Patient declined    Attends Religious Services: Patient declined    Loss adjuster, chartered or Organizations: Patient declined    Attends Engineer, structural: Not on file    Marital Status: Patient declined  Catering manager Violence: Not on file    Family History:    Family History  Problem Relation Age of Onset   Dementia Mother    Heart disease Father    Heart disease Brother 30   Heart disease Brother 48   Peptic Ulcer Disease Brother        removal of part if his esophagus.   GER disease Brother      ROS:  Please see the history of present illness.  Review of Systems  Constitutional:  Positive for malaise/fatigue.  Neurological:  Positive for dizziness and weakness.    All other ROS reviewed and negative.     Physical Exam/Data: Vitals:   08/08/24 1131 08/08/24 1132  BP:  125/78  Pulse:  (!) 53  Resp:  18  Temp:  98.7 F (37.1 C)  SpO2:  100%  Weight: 89.8 kg   Height: 6' (1.829 m)    No intake or output data in the 24 hours ending 08/08/24 1547    08/08/2024   11:31 AM 08/08/2024   10:18 AM 07/22/2024    3:08 PM  Last 3 Weights  Weight (lbs) 198 lb 198 lb 9.6 oz 199 lb 9.6 oz  Weight (kg) 89.812 kg 90.084 kg 90.538 kg     Body mass index is 26.85 kg/m.  General:  Well nourished, well developed, in no acute distress HEENT: normal Neck: no JVD Vascular: No carotid bruits; Distal pulses 2+ bilaterally Cardiac:  normal S1, S2; RRR; no murmur  Lungs:  clear to auscultation bilaterally, no wheezing, rhonchi or rales  Abd: soft, nontender, no hepatomegaly  Ext: no edema Musculoskeletal:  No deformities, BUE and BLE strength normal and equal Skin: warm and dry  Neuro:  CNs 2-12 intact, no focal abnormalities noted Psych:  Normal affect   EKG:  The EKG was personally reviewed and demonstrates: Sinus bradycardia with a rate of 55 and a left axis deviation and LVH with early repolarization.  Telemetry:  Telemetry was personally reviewed and demonstrates:  sinus brady rates in the 50's  Relevant CV Studies:  Echocardiogram  ordered and pending  Cardiac CTA 07/2024  IMPRESSION: 1. Coronary calcium score of 1899. This was 98th percentile for age and sex matched control.   2. Normal coronary origins with co-dominance.  3. CAD-RADS 4 Severe CAD.   4. 70-99% mixed plaque in proximal LAD (aneurysmal segment).   5. 25-49% mixed plaque in proximal RI segment.   6. 1-24% calcified plaque in ostial and proximal LCx segment.   7. 50-69% calcified plaque in proximal RCA. See above for details.   8. CT FFR will be performed and reported separately.   9. Evidence of ascending aortic dilation, 42.94mm. Consider secondary imaging modality (echocardiogram, CTA Aorta Protocol, MRA Aorta Protocol) in 6 months if clinically indicated.   10.  Aortic atherosclerosis.   RECOMMENDATION: Await results of CTFFR. FFR: IMPRESSION: 1.  CT FFR analysis showed significant stenosis proximal LAD.   2. CT-FFR valves in the diagonal branch are in the indeterminate zone. Its likely a combination of both native disease and flow degradation due to LAD disease. Recommend invasive hemodynamics to evaluate if its hemodynamically significant.   RECOMMENDATIONS: Invasive angiography recommended if no contraindications.    Laboratory Data: High Sensitivity Troponin:   Recent Labs  Lab 08/08/24 1135 08/08/24 1330  TROPONINIHS 4 4     Chemistry Recent Labs  Lab 08/08/24 1135  NA 142  K 3.9  CL 106  CO2 23  GLUCOSE 127*  BUN 22  CREATININE 1.32*  CALCIUM 8.6*  GFRNONAA >60  ANIONGAP 13    Recent Labs  Lab 08/08/24 1135  PROT 6.4*  ALBUMIN 3.9  AST 14*  ALT 13  ALKPHOS 43  BILITOT 1.2   Lipids No results for input(s): CHOL, TRIG, HDL, LABVLDL, LDLCALC, CHOLHDL in the last 168 hours.  Hematology Recent Labs  Lab 08/08/24 1135  WBC 8.1  RBC 3.90*  HGB 12.5*  HCT 36.8*  MCV 94.4  MCH 32.1  MCHC 34.0  RDW 13.2  PLT 307   Thyroid No results for input(s): TSH, FREET4 in the last 168  hours.  BNPNo results for input(s): BNP, PROBNP in the last 168 hours.  DDimer  Recent Labs  Lab 08/08/24 1135  DDIMER 0.67*    Radiology/Studies:  CT Angio Chest PE W/Cm &/Or Wo Cm Result Date: 08/08/2024 CLINICAL DATA:  Syncope. Low to intermediate probability for pulmonary embolus. EXAM: CT ANGIOGRAPHY CHEST WITH CONTRAST TECHNIQUE: Multidetector CT imaging of the chest was performed using the standard protocol during bolus administration of intravenous contrast. Multiplanar CT image reconstructions and MIPs were obtained to evaluate the vascular anatomy. RADIATION DOSE REDUCTION: This exam was performed according to the departmental dose-optimization program which includes automated exposure control, adjustment of the mA and/or kV according to patient size and/or use of iterative reconstruction technique. CONTRAST:  75mL OMNIPAQUE  IOHEXOL  350 MG/ML SOLN COMPARISON:  None Available. FINDINGS: Cardiovascular: Negative for pulmonary embolus. Atherosclerotic calcification of the aorta with age advanced involvement of all 3 coronary arteries. Ascending aorta measures 4.0 cm (coronal image 59). Heart is at the upper limits of normal in size to mildly enlarged. No pericardial effusion. Mediastinum/Nodes: No pathologically enlarged mediastinal or left hilar lymph nodes. Borderline enlarged 1.4 cm right hilar lymph node, nonspecific. No axillary adenopathy. Bilateral gynecomastia. Esophagus is grossly unremarkable. Lungs/Pleura: Minimal dependent atelectasis bilaterally. No pleural fluid. Airway is unremarkable. Upper Abdomen: Visualized portions of the liver, gallbladder, adrenal glands, kidneys, spleen, pancreas, stomach and bowel are grossly unremarkable. No upper abdominal adenopathy. Musculoskeletal: Degenerative changes in the spine. Review of the MIP images confirms the above findings. IMPRESSION: 1. Negative for pulmonary embolus. 2.  Age advanced three-vessel coronary artery calcification. 3. 4.0  cm ascending aortic aneurysm. Recommend annual imaging followup by  CTA or MRA. This recommendation follows 2010 ACCF/AHA/AATS/ACR/ASA/SCA/SCAI/SIR/STS/SVM Guidelines for the Diagnosis and Management of Patients with Thoracic Aortic Disease. Circulation. 2010; 121: Z733-z630. Aortic aneurysm NOS (ICD10-I71.9). 4.  Aortic atherosclerosis (ICD10-I70.0). Electronically Signed   By: Newell Eke M.D.   On: 08/08/2024 14:20   DG Chest 1 View Result Date: 08/08/2024 EXAM: 1 VIEW(S) XRAY OF THE CHEST 08/08/2024 11:51:00 AM COMPARISON: None available. CLINICAL HISTORY: cp. Syncopal episode today. Hx of HTN. Per ED note, patient needs heart cath. FINDINGS: LUNGS AND PLEURA: No focal pulmonary opacity. No pulmonary edema. No pleural effusion. No pneumothorax. HEART AND MEDIASTINUM: No acute abnormality of the cardiac and mediastinal silhouettes. BONES AND SOFT TISSUES: No acute osseous abnormality. IMPRESSION: 1. No acute abnormalities. Electronically signed by: Ryan Salvage MD 08/08/2024 12:45 PM EDT RP Workstation: [id number]     Assessment and Plan: Coronary artery disease/unstable angina/syncope -Recent coronary CTA with severe CAD with positive FFR in the LAD -Recommend cardiac catheterization tomorrow -Continue aspirin  -Started on atorvastatin 40 mg daily -Syncopal episode likely secondary to hypotension -PTA Toprol  on hold due to baseline bradycardia -PTA Imdur  and lisinopril  remain on hold -EKG completed in clinic today revealed T wave inversions in lead III -Patient continues to remain chest pain-free -Continue with telemetry monitoring -Echocardiogram ordered and pending with further recommendations to follow -EKG as needed for pain no changes  Dehydration -Elevated serum creatinine 1.3 -Received 250 cc bolus normal saline, 500 cc bolus of normal saline thus far -Recommend gentle hydration -Daily BMP  Dilated aortic root/ascending aortic aneurysm -42.8 mm on coronary CTA,  recommend follow-up in 6 months -CT chest revealing 4.0 cm ascending aortic aneurysm with recommendation of annual imaging  Hyperlipidemia -LDL 175 - Started on atorvastatin 40 mg daily -Previously was not on statin therapy  Hypertension -Blood pressure 125/78 -PTA lisinopril  and Toprol  and Imdur  currently on hold -Vital signs per unit protocol  Anxiety -Ongoing management per IM   Risk Assessment/Risk Scores:       Informed Consent   Shared Decision Making/Informed Consent The risks [stroke (1 in 1000), death (1 in 1000), kidney failure [usually temporary] (1 in 500), bleeding (1 in 200), allergic reaction [possibly serious] (1 in 200)], benefits (diagnostic support and management of coronary artery disease) and alternatives of a cardiac catheterization were discussed in detail with Mr. Ancheta and he is willing to proceed.           For questions or updates, please contact [facility] Please consult [email or web address] for contact info under      Signed, Creedon Danielski, NP  08/08/2024 3:47 PM

## 2024-08-08 NOTE — Progress Notes (Signed)
 Cardiology Office Note   Date:  08/08/2024  ID:  HANDSOME ANGLIN, DOB Jan 07, 1961, MRN [id number] PCP: Sharma Coyer, MD  [facility] Providers Cardiologist: Dr. Perla  History of Present Illness James Arellano is a 63 y.o. male with a history of depression/anxiety, hyperlipidemia, family history of coronary atherosclerosis, CAD, hypertension who presents for chest pain follow-up  Patient was seen 07/22/24 as a new patient for chest pain.  Coronary calcium score is 1899, severe CAD with 70 to 99% mixed plaque in the proximal LAD, 25 to 49% mixed plaque in the proximal RI segment, 1 to 24% calcified plaque in ostial proximal left circumflex segment, 50 to 69% calcified plaque in proximal RCA.  CT FFR showed significant stenosis in the proximal LAD.  Patient was notified and cardiac catheterization was recommended, however patient had a vacation planned so cardiac medications were sent in: Toprol  25 mg daily, Imdur  30 mg daily, aspirin  81 mg daily, sublingual nitroglycerin .  Lisinopril  was decreased to 20 mg daily.  Today, the patient reports he had chest pain episodes over the last week when he was in [place]. He took SL NTG twice with improvement of chest pain. One time he was in a museum and it was very hot and he was very tired and took SL NTG. She second time was on the plane coming home. He describes symptoms as chest pressure with SOB. No nausea or vomiting. He denies active chest pain.  During the visit patient became dizzy and lightheaded, and he was placed in a supine position. BP 92/62. He was diaphoretic with slow breathing, but no chest pain. He was given a cup of water, and only took a couple sips before he passed out. He quickly came to. MD was called in and IV was started. He was given 250cc bolus IVF. He denied any chest pain. EKG showed SR, HR 66bpm with TWI III. He was given ASA 324mg . BP improved to R 136/77 and L 121/74. Bedside echo showed normal Biv  function. He was started on 500cc bolus and EMS was called. Patient continued to be chest pain free. He was taken to the ER for further work-up. Patient's partner reported he did not eat this morning and he took all his cardiac meds this AM (amlodipine  5mg  daily, Lisinopril  20mg  daily, Toprol  25mg  daily, Imdur  30mg  daily)  Studies Reviewed EKG Interpretation Date/Time:  Monday August 08 2024 10:29:53 EDT Ventricular Rate:  58 PR Interval:  180 QRS Duration:  96 QT Interval:  418 QTC Calculation: 410 R Axis:   -32  Text Interpretation: Sinus bradycardia Left axis deviation When compared with ECG of 22-Jul-2024 15:11, No significant change was found Confirmed by Franchester, Malori Myers ([id number]) on 08/08/2024 10:37:43 AM     Cardiac CTA 07/2024  IMPRESSION: 1. Coronary calcium score of 1899. This was 98th percentile for age and sex matched control.   2. Normal coronary origins with co-dominance.   3. CAD-RADS 4 Severe CAD.   4. 70-99% mixed plaque in proximal LAD (aneurysmal segment).   5. 25-49% mixed plaque in proximal RI segment.   6. 1-24% calcified plaque in ostial and proximal LCx segment.   7. 50-69% calcified plaque in proximal RCA. See above for details.   8. CT FFR will be performed and reported separately.   9. Evidence of ascending aortic dilation, 42.56mm. Consider secondary imaging modality (echocardiogram, CTA Aorta Protocol, MRA Aorta Protocol) in 6 months if clinically indicated.   10.  Aortic atherosclerosis.  RECOMMENDATION: Await results of CTFFR. FFR: IMPRESSION: 1.  CT FFR analysis showed significant stenosis proximal LAD.   2. CT-FFR valves in the diagonal branch are in the indeterminate zone. Its likely a combination of both native disease and flow degradation due to LAD disease. Recommend invasive hemodynamics to evaluate if its hemodynamically significant.   RECOMMENDATIONS: Invasive angiography recommended if no contraindications.      Physical  Exam VS:  BP 99/68 (BP Location: Left Arm, Patient Position: Sitting, Cuff Size: Normal) Comment: pt state he has a headache not ,a little leight headed  Pulse (!) 58   Ht 6' (1.829 m)   Wt 198 lb 9.6 oz (90.1 kg)   SpO2 98%   BMI 26.94 kg/m        Wt Readings from Last 3 Encounters:  08/08/24 198 lb 9.6 oz (90.1 kg)  07/22/24 199 lb 9.6 oz (90.5 kg)  07/20/24 201 lb 14.4 oz (91.6 kg)    GEN: Well nourished, well developed in no acute distress NECK: No JVD; No carotid bruits CARDIAC: RRR, no murmurs, rubs, gallops RESPIRATORY:  Clear to auscultation without rales, wheezing or rhonchi  ABDOMEN: Soft, non-tender, non-distended EXTREMITIES:  No edema; No deformity   ASSESSMENT AND PLAN  CAD Unstable angina Syncope, suspected due to hypotension Recent cardiac CTA showed severe CAD with positive FFR in the LAD (report above). Cardiac cath was recommended, however patient went on 1 week vacation in cardiac medications were sent in so he can take them on vacation (toprol , ASA, Imdur , SL NTG). During the vacation required SL NTG twice with improvement of symptoms. Today, EKG was stable and we were discussing cardiac cath when he became dizzy, diaphoretic, hypotensive and passed out.  Initial blood pressure was 99/68.  BP was as low as 71/42. MD, Dr. [place] was called in. 250cc IVF bolus was given.  EKG showed SR with TWI III.  The patient was given ASA 324mg  once. Bedside echo showed normal Biv and he was started on 500cc IVF bolus. BP improved to 136/77 R and 121/74 on the left. EMS was called and he was taken to the ER for further work-up.  Patient will likely be admitted.  He will need updated lab work.  Would hold lisinopril  and amlodipine . I recommend starting a statin and checking a complete echocardiogram.  Dilated aorta CT showed ascending aortic dilation of 42.86mm. repeat imaging in 6 months.        Dispo: Follow-up posthospital/ER  Signed, Starlynn Klinkner VEAR Fishman, PA-C

## 2024-08-08 NOTE — Addendum Note (Signed)
 Addended by: FRANCHESTER MIKEY DEL on: 08/08/2024 02:27 PM   Modules accepted: Level of Service

## 2024-08-08 NOTE — ED Notes (Signed)
 Pt states he normally takes medication in the mornings. Did not want to take because the medication keeps me up.

## 2024-08-09 ENCOUNTER — Encounter
Admission: EM | Disposition: A | Discharge status: Home / Self Care | Payer: Self-pay | Source: Home / Self Care | Attending: Internal Medicine

## 2024-08-09 DIAGNOSIS — I200 Unstable angina: Secondary | ICD-10-CM | POA: Diagnosis not present

## 2024-08-09 DIAGNOSIS — E785 Hyperlipidemia, unspecified: Secondary | ICD-10-CM | POA: Diagnosis present

## 2024-08-09 DIAGNOSIS — R5383 Other fatigue: Secondary | ICD-10-CM | POA: Diagnosis present

## 2024-08-09 DIAGNOSIS — R7989 Other specified abnormal findings of blood chemistry: Secondary | ICD-10-CM | POA: Diagnosis present

## 2024-08-09 DIAGNOSIS — I2510 Atherosclerotic heart disease of native coronary artery without angina pectoris: Secondary | ICD-10-CM

## 2024-08-09 DIAGNOSIS — I25118 Atherosclerotic heart disease of native coronary artery with other forms of angina pectoris: Secondary | ICD-10-CM | POA: Diagnosis not present

## 2024-08-09 DIAGNOSIS — I700 Atherosclerosis of aorta: Secondary | ICD-10-CM | POA: Diagnosis present

## 2024-08-09 DIAGNOSIS — Q245 Malformation of coronary vessels: Secondary | ICD-10-CM | POA: Diagnosis not present

## 2024-08-09 DIAGNOSIS — I959 Hypotension, unspecified: Secondary | ICD-10-CM | POA: Diagnosis present

## 2024-08-09 DIAGNOSIS — E860 Dehydration: Secondary | ICD-10-CM | POA: Diagnosis present

## 2024-08-09 DIAGNOSIS — Z955 Presence of coronary angioplasty implant and graft: Secondary | ICD-10-CM | POA: Diagnosis not present

## 2024-08-09 DIAGNOSIS — I10 Essential (primary) hypertension: Secondary | ICD-10-CM

## 2024-08-09 DIAGNOSIS — F419 Anxiety disorder, unspecified: Secondary | ICD-10-CM | POA: Diagnosis present

## 2024-08-09 DIAGNOSIS — Z7982 Long term (current) use of aspirin: Secondary | ICD-10-CM | POA: Diagnosis not present

## 2024-08-09 DIAGNOSIS — I25110 Atherosclerotic heart disease of native coronary artery with unstable angina pectoris: Secondary | ICD-10-CM | POA: Diagnosis present

## 2024-08-09 DIAGNOSIS — K2270 Barrett's esophagus without dysplasia: Secondary | ICD-10-CM | POA: Diagnosis present

## 2024-08-09 DIAGNOSIS — R001 Bradycardia, unspecified: Secondary | ICD-10-CM | POA: Diagnosis present

## 2024-08-09 DIAGNOSIS — F32A Depression, unspecified: Secondary | ICD-10-CM | POA: Diagnosis present

## 2024-08-09 DIAGNOSIS — I7121 Aneurysm of the ascending aorta, without rupture: Secondary | ICD-10-CM | POA: Diagnosis present

## 2024-08-09 DIAGNOSIS — R55 Syncope and collapse: Secondary | ICD-10-CM | POA: Diagnosis not present

## 2024-08-09 DIAGNOSIS — Z8249 Family history of ischemic heart disease and other diseases of the circulatory system: Secondary | ICD-10-CM | POA: Diagnosis not present

## 2024-08-09 DIAGNOSIS — Z79899 Other long term (current) drug therapy: Secondary | ICD-10-CM | POA: Diagnosis not present

## 2024-08-09 DIAGNOSIS — K2100 Gastro-esophageal reflux disease with esophagitis, without bleeding: Secondary | ICD-10-CM | POA: Diagnosis present

## 2024-08-09 DIAGNOSIS — I25119 Atherosclerotic heart disease of native coronary artery with unspecified angina pectoris: Secondary | ICD-10-CM | POA: Diagnosis not present

## 2024-08-09 DIAGNOSIS — R079 Chest pain, unspecified: Secondary | ICD-10-CM | POA: Diagnosis not present

## 2024-08-09 DIAGNOSIS — I2542 Coronary artery dissection: Secondary | ICD-10-CM | POA: Diagnosis not present

## 2024-08-09 DIAGNOSIS — I2584 Coronary atherosclerosis due to calcified coronary lesion: Secondary | ICD-10-CM | POA: Diagnosis present

## 2024-08-09 HISTORY — PX: LEFT HEART CATH AND CORONARY ANGIOGRAPHY: CATH118249

## 2024-08-09 HISTORY — PX: CORONARY STENT INTERVENTION: CATH118234

## 2024-08-09 HISTORY — DX: Atherosclerotic heart disease of native coronary artery without angina pectoris: I25.10

## 2024-08-09 HISTORY — PX: CORONARY IMAGING/OCT: CATH118326

## 2024-08-09 LAB — CBC
HCT: 37.6 % — ABNORMAL LOW (ref 39.0–52.0)
Hemoglobin: 12.9 g/dL — ABNORMAL LOW (ref 13.0–17.0)
MCH: 31.9 pg (ref 26.0–34.0)
MCHC: 34.3 g/dL (ref 30.0–36.0)
MCV: 92.8 fL (ref 80.0–100.0)
Platelets: 292 K/uL (ref 150–400)
RBC: 4.05 MIL/uL — ABNORMAL LOW (ref 4.22–5.81)
RDW: 12.9 % (ref 11.5–15.5)
WBC: 6.8 K/uL (ref 4.0–10.5)
nRBC: 0 % (ref 0.0–0.2)

## 2024-08-09 LAB — LIPID PANEL
Cholesterol: 217 mg/dL — ABNORMAL HIGH (ref 0–200)
HDL: 51 mg/dL (ref >40)
LDL Cholesterol: 156 mg/dL — ABNORMAL HIGH (ref 0–99)
Total CHOL/HDL Ratio: 4.3 ratio
Triglycerides: 49 mg/dL (ref <150)
VLDL: 10 mg/dL (ref 0–40)

## 2024-08-09 LAB — BASIC METABOLIC PANEL WITH GFR
Anion gap: 8 (ref 5–15)
BUN: 24 mg/dL — ABNORMAL HIGH (ref 8–23)
CO2: 24 mmol/L (ref 22–32)
Calcium: 8.8 mg/dL — ABNORMAL LOW (ref 8.9–10.3)
Chloride: 106 mmol/L (ref 98–111)
Creatinine, Ser: 0.91 mg/dL (ref 0.61–1.24)
GFR, Estimated: >60 mL/min (ref >60)
Glucose, Bld: 96 mg/dL (ref 70–99)
Potassium: 3.5 mmol/L (ref 3.5–5.1)
Sodium: 138 mmol/L (ref 135–145)

## 2024-08-09 LAB — POCT ACTIVATED CLOTTING TIME
Activated Clotting Time: 250 s
Activated Clotting Time: 268 s
Activated Clotting Time: 256 s
Activated Clotting Time: 262 s
Activated Clotting Time: 285 s

## 2024-08-09 LAB — HIV ANTIBODY (ROUTINE TESTING W REFLEX): HIV Screen 4th Generation wRfx: NONREACTIVE

## 2024-08-09 LAB — APTT: aPTT: 30 s (ref 24–36)

## 2024-08-09 LAB — PROTIME-INR
INR: 1 (ref 0.8–1.2)
Prothrombin Time: 13.7 s (ref 11.4–15.2)

## 2024-08-09 SURGERY — LEFT HEART CATH AND CORONARY ANGIOGRAPHY
Anesthesia: Moderate Sedation

## 2024-08-09 MED ORDER — LIDOCAINE HCL 1 % IJ SOLN
INTRAMUSCULAR | Status: AC
  Filled 2024-08-09: qty 20

## 2024-08-09 MED ORDER — HEPARIN SODIUM (PORCINE) 1000 UNIT/ML IJ SOLN
INTRAMUSCULAR | Status: DC | PRN
  Administered 2024-08-09 (×2): 3000 [IU] via INTRAVENOUS
  Administered 2024-08-09 (×2): 4500 [IU] via INTRAVENOUS
  Administered 2024-08-09 (×2): 3000 [IU] via INTRAVENOUS

## 2024-08-09 MED ORDER — HEPARIN SODIUM (PORCINE) 1000 UNIT/ML IJ SOLN
INTRAMUSCULAR | Status: AC
  Filled 2024-08-09: qty 10

## 2024-08-09 MED ORDER — MIDAZOLAM HCL 2 MG/2ML IJ SOLN
INTRAMUSCULAR | Status: AC
  Filled 2024-08-09: qty 2

## 2024-08-09 MED ORDER — PRASUGREL HCL 10 MG PO TABS
ORAL_TABLET | ORAL | Status: AC
  Filled 2024-08-09: qty 6

## 2024-08-09 MED ORDER — SODIUM CHLORIDE 0.9 % IV SOLN: 250.0000 mL | INTRAVENOUS | Status: AC | PRN

## 2024-08-09 MED ORDER — HEPARIN (PORCINE) IN NACL 1000-0.9 UT/500ML-% IV SOLN
INTRAVENOUS | Status: DC | PRN
  Administered 2024-08-09: 1000 mL
  Administered 2024-08-09: 500 mL

## 2024-08-09 MED ORDER — VERAPAMIL HCL 2.5 MG/ML IV SOLN
INTRAVENOUS | Status: DC | PRN
  Administered 2024-08-09 (×2): 2.5 mg via INTRA_ARTERIAL

## 2024-08-09 MED ORDER — SODIUM CHLORIDE 0.9% FLUSH
3.0000 mL | Freq: Two times a day (BID) | INTRAVENOUS | Status: DC
  Administered 2024-08-10 – 2024-08-11 (×2): 3 mL via INTRAVENOUS

## 2024-08-09 MED ORDER — VERAPAMIL HCL 2.5 MG/ML IV SOLN
INTRAVENOUS | Status: AC
  Filled 2024-08-09: qty 2

## 2024-08-09 MED ORDER — SODIUM CHLORIDE 0.9 % IV SOLN
INTRAVENOUS | Status: DC | PRN
  Administered 2024-08-09: 100 mL/h via INTRAVENOUS

## 2024-08-09 MED ORDER — ENOXAPARIN SODIUM 40 MG/0.4ML IJ SOSY
40.0000 mg | PREFILLED_SYRINGE | INTRAMUSCULAR | Status: DC
  Administered 2024-08-10 – 2024-08-11 (×2): 40 mg via SUBCUTANEOUS
  Filled 2024-08-09 (×2): qty 0.4

## 2024-08-09 MED ORDER — HEPARIN (PORCINE) IN NACL 1000-0.9 UT/500ML-% IV SOLN
INTRAVENOUS | Status: AC
  Filled 2024-08-09: qty 500

## 2024-08-09 MED ORDER — SODIUM CHLORIDE 0.9% FLUSH: 3.0000 mL | INTRAVENOUS | Status: DC | PRN

## 2024-08-09 MED ORDER — FENTANYL CITRATE (PF) 100 MCG/2ML IJ SOLN
INTRAMUSCULAR | Status: AC
  Filled 2024-08-09: qty 2

## 2024-08-09 MED ORDER — ATORVASTATIN CALCIUM 80 MG PO TABS
80.0000 mg | ORAL_TABLET | Freq: Every day | ORAL | Status: DC
  Administered 2024-08-10 – 2024-08-11 (×2): 80 mg via ORAL
  Filled 2024-08-09 (×2): qty 1

## 2024-08-09 MED ORDER — POTASSIUM CHLORIDE CRYS ER 20 MEQ PO TBCR
40.0000 meq | EXTENDED_RELEASE_TABLET | Freq: Once | ORAL | Status: AC
Start: 2024-08-09 — End: 2024-08-09
  Administered 2024-08-09: 40 meq via ORAL
  Filled 2024-08-09: qty 4

## 2024-08-09 MED ORDER — IOHEXOL 300 MG/ML  SOLN
INTRAMUSCULAR | Status: DC | PRN
  Administered 2024-08-09: 290 mL

## 2024-08-09 MED ORDER — SODIUM CHLORIDE 0.9 % IV SOLN: INTRAVENOUS | Status: AC

## 2024-08-09 MED ORDER — LABETALOL HCL 5 MG/ML IV SOLN: 10.0000 mg | INTRAVENOUS | Status: AC | PRN

## 2024-08-09 MED ORDER — NITROGLYCERIN 1 MG/10 ML FOR IR/CATH LAB
INTRA_ARTERIAL | Status: AC
Start: 2024-08-09 — End: 2024-08-09
  Filled 2024-08-09: qty 10

## 2024-08-09 MED ORDER — FENTANYL CITRATE (PF) 100 MCG/2ML IJ SOLN
INTRAMUSCULAR | Status: DC | PRN
  Administered 2024-08-09 (×2): 25 ug via INTRAVENOUS
  Administered 2024-08-09: 50 ug via INTRAVENOUS

## 2024-08-09 MED ORDER — LIDOCAINE HCL (PF) 1 % IJ SOLN
INTRAMUSCULAR | Status: DC | PRN
  Administered 2024-08-09: 2 mL

## 2024-08-09 MED ORDER — HYDRALAZINE HCL 20 MG/ML IJ SOLN: 10.0000 mg | INTRAMUSCULAR | Status: AC | PRN

## 2024-08-09 MED ORDER — NITROGLYCERIN 1 MG/10 ML FOR IR/CATH LAB
INTRA_ARTERIAL | Status: DC | PRN
  Administered 2024-08-09 (×2): 200 ug via INTRACORONARY

## 2024-08-09 MED ORDER — PRASUGREL HCL 10 MG PO TABS
10.0000 mg | ORAL_TABLET | Freq: Every day | ORAL | Status: DC
  Administered 2024-08-10 – 2024-08-11 (×2): 10 mg via ORAL
  Filled 2024-08-09 (×2): qty 1

## 2024-08-09 MED ORDER — HEPARIN SODIUM (PORCINE) 1000 UNIT/ML IJ SOLN
INTRAMUSCULAR | Status: AC
Start: 2024-08-09 — End: 2024-08-09
  Filled 2024-08-09: qty 10

## 2024-08-09 MED ORDER — PRASUGREL HCL 10 MG PO TABS
ORAL_TABLET | ORAL | Status: DC | PRN
  Administered 2024-08-09: 60 mg via ORAL

## 2024-08-09 MED ORDER — MIDAZOLAM HCL 2 MG/2ML IJ SOLN
INTRAMUSCULAR | Status: DC | PRN
  Administered 2024-08-09 (×3): 1 mg via INTRAVENOUS

## 2024-08-09 MED ORDER — POTASSIUM CHLORIDE CRYS ER 20 MEQ PO TBCR
40.0000 meq | EXTENDED_RELEASE_TABLET | Freq: Once | ORAL | Status: DC
  Filled 2024-08-09: qty 2

## 2024-08-09 MED ORDER — HEPARIN (PORCINE) IN NACL 1000-0.9 UT/500ML-% IV SOLN
INTRAVENOUS | Status: AC
  Filled 2024-08-09: qty 1000

## 2024-08-09 SURGICAL SUPPLY — 18 items
BALLOON TREK RX 3.0X15 (BALLOONS) IMPLANT
BALLOON ~~LOC~~ TREK NEO RX 4.0X15 (BALLOONS) IMPLANT
BALLOON ~~LOC~~ TREK NEO RX 4.5X8 (BALLOONS) IMPLANT
CATH DRAGONFLY OPSTAR (CATHETERS) IMPLANT
CATH INFINITI AMBI 5FR TG (CATHETERS) IMPLANT
CATH VISTA GUIDE 6FR XBLD 3.5 (CATHETERS) IMPLANT
DEVICE RAD TR BAND REGULAR (VASCULAR PRODUCTS) IMPLANT
DRAPE BRACHIAL (DRAPES) IMPLANT
GLIDESHEATH SLEND SS 6F .021 (SHEATH) IMPLANT
GUIDEWIRE INQWIRE 1.5J.035X260 (WIRE) IMPLANT
KIT ENCORE 26 ADVANTAGE (KITS) IMPLANT
PACK CARDIAC CATH (CUSTOM PROCEDURE TRAY) ×1 IMPLANT
SET ATX-X65L (MISCELLANEOUS) IMPLANT
STATION PROTECTION PRESSURIZED (MISCELLANEOUS) IMPLANT
STENT ONYX FRONTIER 3.5X34 (Permanent Stent) IMPLANT
STENT ONYX FRONTIER 4.0X08 (Permanent Stent) IMPLANT
WIRE G HI TQ BMW 190 (WIRE) IMPLANT
WIRE RUNTHROUGH .014X180CM (WIRE) IMPLANT

## 2024-08-09 NOTE — Hospital Course (Signed)
 James Arellano is a 63 y.o. male with medical history significant of hypertension, hyperlipidemia, depression/anxiety, family history of CAD, recent abnormal CTA coronary with plans for outpatient cardiac cath underway.  Patient was seen in cardiology clinic today for precath appointment, where patient had a syncopal episode that was preceded by diaphoresis, dizziness and lightheadedness.  Patient's BP at that time was reportedly 71/42, he was started on IV fluids and on arrival to the ED BP had improved to 135/85.  Patient has a normal troponin.  Heart cath scheduled 10/7.

## 2024-08-09 NOTE — Progress Notes (Signed)
  Progress Note   Patient: James Arellano FMW:[id number] DOB: 1961-09-11 DOA: 08/08/2024     0 DOS: the patient was seen and examined on 08/09/2024   Brief hospital course: James Arellano is a 63 y.o. male with medical history significant of hypertension, hyperlipidemia, depression/anxiety, family history of CAD, recent abnormal CTA coronary with plans for outpatient cardiac cath underway.  Patient was seen in cardiology clinic today for precath appointment, where patient had a syncopal episode that was preceded by diaphoresis, dizziness and lightheadedness.  Patient's BP at that time was reportedly 71/42, he was started on IV fluids and on arrival to the ED BP had improved to 135/85.  Patient has a normal troponin.  Heart cath scheduled 10/7.   Principal Problem:   Syncope Active Problems:   H/O: depression   Essential hypertension   Depression   History of Barrett's esophagus   GERD with esophagitis   Coronary artery disease   Unstable angina (HCC)   Assessment and Plan: Syncope -due to hypotension  Unstable angina Coronary artery disease Dyslipidemia.  - Recent CTA coronary showed severe CAD with positive FFR in the LAD Multiple medicine was started recently, hypotension could be from medication.  No diarrhea or GI symptoms. Known coronary disease with positive CT angiogram, heart cath scheduled for today. Continue aspirin , LDL increased to 156, increase atorvastatin to 80 mg daily.    Reported history of prolonged bleeding -no diagnosis of bleeding diathesis but patient and wife report that he seems to bleed for prolonged time even after a minor cut or injury. He has a normal platelet count, normal PT/INR and normal APTT.   Essential hypertension  Blood pressure medicine on hold due to syncope episode.   Dilated aortic root /ascending aortic aneurysm -- Noted on CTA chest: 4.0 cm -- Recommend surveillance with annual imaging   Depression/anxiety -- Resume home  Wellbutrin , Trintellix  Xanax  as needed   History of Barrett's esophagus  -- On twice daily PPI       Subjective:  Patient doing well today, no complaint.  Physical Exam: Vitals:   08/08/24 2354 08/09/24 0500 08/09/24 0514 08/09/24 0749  BP: 116/81  130/83 127/85  Pulse: (!) 56  (!) 57 60  Resp: 19  16   Temp: 97.7 F (36.5 C)  (!) 97.5 F (36.4 C) 98 F (36.7 C)  TempSrc:    Oral  SpO2: 96%  98% 98%  Weight:  88.9 kg    Height:       General exam: Appears calm and comfortable  Respiratory system: Clear to auscultation. Respiratory effort normal. Cardiovascular system: S1 & S2 heard, RRR. No JVD, murmurs, rubs, gallops or clicks. No pedal edema. Gastrointestinal system: Abdomen is nondistended, soft and nontender. No organomegaly or masses felt. Normal bowel sounds heard. Central nervous system: Alert and oriented. No focal neurological deficits. Extremities: Symmetric 5 x 5 power. Skin: No rashes, lesions or ulcers Psychiatry: Judgement and insight appear normal. Mood & affect appropriate.    Data Reviewed:  Lab results reviewed.  Family Communication: None  Disposition: Status is: Observation      Time spent: 35 minutes  Author: Murvin Mana, MD 08/09/2024 11:45 AM  For on call review [email or web address].

## 2024-08-09 NOTE — Progress Notes (Signed)
  Progress Note  Patient Name: James Arellano Date of Encounter: 08/09/2024 [facility] Cardiologist: Evalene Lunger, MD   Interval Summary   Patient notes some intermittent chest pain overnight.  He underwent challenging but ultimately successful PCI to ostial through mid LAD this afternoon with placement of 2 overlapping drug-eluting stents.  He reports feeling better following intervention.  He denies shortness of breath  Vital Signs Vitals:   08/09/24 1700 08/09/24 1715 08/09/24 1730 08/09/24 1800  BP: 125/77 128/79 129/80 135/62  Pulse: (!) 59 (!) 57 (!) 57 (!) 53  Resp: 13 15 13 15   Temp:    (!) 97.5 F (36.4 C)  TempSrc:    Temporal  SpO2: 98% 97% 96% 95%  Weight:      Height:        Intake/Output Summary (Last 24 hours) at 08/09/2024 1845 Last data filed at 08/09/2024 1835 Gross per 24 hour  Intake 991.44 ml  Output --  Net 991.44 ml      08/09/2024    5:00 AM 08/08/2024    9:58 PM 08/08/2024   11:31 AM  Last 3 Weights  Weight (lbs) 195 lb 15.8 oz 195 lb 15.8 oz 198 lb  Weight (kg) 88.9 kg 88.9 kg 89.812 kg      Telemetry/ECG  Sinus rhythm - Personally Reviewed  Physical Exam  GEN: No acute distress.  Significant other at the bedside. Neck: No JVD Cardiac: RRR, no murmurs, rubs, or gallops.  Right radial arteriotomy site covered with TR band.  No hematoma appreciated. Respiratory: Clear to auscultation bilaterally. GI: Soft, nontender, non-distended  MS: No edema  Assessment & Plan  Coronary artery disease with unstable angina: Patient reports progressive chest pain, intermittently present at rest, over the last few months.  Recent coronary CTA notable for severe proximal LAD disease, confirmed on today's catheterization.  Myocardial bridging with moderate stenosis also noted in the mid LAD as well as nonobstructive ramus, LCx, and RCA disease.  Patient underwent successful albeit complicated PCI to the ostial through mid LAD.  Incidental note was  also made of tiny dissection in the mid LMCA evident only on OCT and not by angiography.  This was treated conservatively without stent placement. -Dual antiplatelet therapy with aspirin  and prasugrel for at least 6 months. -Obtain CYP2C19 genotype. -Continue atorvastatin 80 mg daily. -Check LP(a). -Gentle postcatheterization hydration given significant contrast volume used for today's procedure.  Syncope: Felt to be iatrogenic from recent addition of multiple antianginal medications.  No significant arrhythmia observed.  Echocardiogram yesterday showed normal LVEF without significant valvular abnormalities. -Continue holding amlodipine , metoprolol , and isosorbide  mononitrate. -Continue telemetry monitoring.  Hypertension: Blood pressure normal to mildly elevated.  Home antihypertensive medications, which likely contributed to syncope in the office, are on hold. - Continue holding amlodipine , lisinopril , metoprolol , and isosorbide  mononitrate for now.  Consider judicious reintroduction of amlodipine  or lisinopril  prior to discharge as blood pressure allows.  Hyperlipidemia: -Continue atorvastatin 80 mg daily. -Check LP(a).  For questions or updates, please contact [facility] Please consult [email or web address] for contact info under [facility].  Signed, Lonni Hanson, MD

## 2024-08-09 NOTE — Brief Op Note (Signed)
 BRIEF CARDIAC CATHETERIZATION NOTE  08/09/2024  3:57 PM  PATIENT:  James Arellano  63 y.o. male  PRE-OPERATIVE DIAGNOSIS:  Coronary artery disease with unstable angina, abnormal cardiac CTA, and syncope  POST-OPERATIVE DIAGNOSIS:  Same  PROCEDURE:  Procedure(s): Left Heart Cath (N/A) CORONARY IMAGING/OCT (N/A) CORONARY STENT INTERVENTION (N/A)  SURGEON:  Surgeons and Role:    * Zaiden Ludlum, MD - Primary  FINDINGS: Severe single vessel CAD with 80-90% proximal LAD (MLA 1.57 mm^2) and 50% mid LAD (element of myocardial bridging). Nonobstructive ramus, LCx, and RCA disease. Mildly elevated LVEDP. Successful PCI to ostial through mid LAD using overlapping Onyx Frontier 4.0 x 8 mm and 3.5 x 34 mm drug-eluting stents with 0% residual stenosis and TIMI-3 flow.  RECOMMENDATIONS: Overnight observation. DAPT with ASA and prasugrel for at least 6 months. Aggressive secondary prevention. Gentle post-cath hydration.  Lonni Hanson, MD [facility]

## 2024-08-09 NOTE — Interval H&P Note (Signed)
 History and Physical Interval Note:  08/09/2024 1:16 PM  LORD LANCOUR  has presented today for surgery, with the diagnosis of unstable angina, syncope, and abnormal cardiac CTA.  The various methods of treatment have been discussed with the patient and family. After consideration of risks, benefits and other options for treatment, the patient has consented to  Procedure(s): Left Heart Cath (N/A) as a surgical intervention.  The patient's history has been reviewed, patient examined, no change in status, stable for surgery.  I have reviewed the patient's chart and labs.  Questions were answered to the patient's satisfaction.    Cath Lab Visit (complete for each Cath Lab visit)  Clinical Evaluation Leading to the Procedure:   ACS: No.  Non-ACS:    Anginal Classification: CCS IV  Anti-ischemic medical therapy: Maximal Therapy (2 or more classes of medications)  Non-Invasive Test Results: Abnormal cardiac CTA with significant LAD disease -> intermediate to high risk  Prior CABG: No previous CABG  Sharlyne Koeneman

## 2024-08-10 ENCOUNTER — Encounter: Payer: Self-pay | Admitting: Internal Medicine

## 2024-08-10 DIAGNOSIS — I25119 Atherosclerotic heart disease of native coronary artery with unspecified angina pectoris: Secondary | ICD-10-CM

## 2024-08-10 DIAGNOSIS — R079 Chest pain, unspecified: Secondary | ICD-10-CM

## 2024-08-10 DIAGNOSIS — E785 Hyperlipidemia, unspecified: Secondary | ICD-10-CM | POA: Diagnosis not present

## 2024-08-10 DIAGNOSIS — I25118 Atherosclerotic heart disease of native coronary artery with other forms of angina pectoris: Secondary | ICD-10-CM

## 2024-08-10 DIAGNOSIS — R55 Syncope and collapse: Secondary | ICD-10-CM | POA: Diagnosis not present

## 2024-08-10 LAB — BASIC METABOLIC PANEL WITH GFR
Anion gap: 8 (ref 5–15)
BUN: 19 mg/dL (ref 8–23)
CO2: 24 mmol/L (ref 22–32)
Calcium: 9 mg/dL (ref 8.9–10.3)
Chloride: 106 mmol/L (ref 98–111)
Creatinine, Ser: 0.98 mg/dL (ref 0.61–1.24)
GFR, Estimated: >60 mL/min (ref >60)
Glucose, Bld: 109 mg/dL — ABNORMAL HIGH (ref 70–99)
Potassium: 3.8 mmol/L (ref 3.5–5.1)
Sodium: 138 mmol/L (ref 135–145)

## 2024-08-10 LAB — CBC
HCT: 37.2 % — ABNORMAL LOW (ref 39.0–52.0)
Hemoglobin: 12.7 g/dL — ABNORMAL LOW (ref 13.0–17.0)
MCH: 31.6 pg (ref 26.0–34.0)
MCHC: 34.1 g/dL (ref 30.0–36.0)
MCV: 92.5 fL (ref 80.0–100.0)
Platelets: 280 K/uL (ref 150–400)
RBC: 4.02 MIL/uL — ABNORMAL LOW (ref 4.22–5.81)
RDW: 12.6 % (ref 11.5–15.5)
WBC: 7.9 K/uL (ref 4.0–10.5)
nRBC: 0 % (ref 0.0–0.2)

## 2024-08-10 LAB — PLATELET FUNCTION ASSAY
Collagen / ADP: >300 s — ABNORMAL HIGH (ref 0–118)
Collagen / Epinephrine: >300 s — ABNORMAL HIGH (ref 0–193)

## 2024-08-10 LAB — MAGNESIUM: Magnesium: 2.2 mg/dL (ref 1.7–2.4)

## 2024-08-10 MED ORDER — AMLODIPINE BESYLATE 5 MG PO TABS
5.0000 mg | ORAL_TABLET | Freq: Every day | ORAL | Status: DC
  Administered 2024-08-10 – 2024-08-11 (×2): 5 mg via ORAL
  Filled 2024-08-10 (×2): qty 1

## 2024-08-10 MED ORDER — EZETIMIBE 10 MG PO TABS
10.0000 mg | ORAL_TABLET | Freq: Every day | ORAL | Status: DC
  Administered 2024-08-10 – 2024-08-11 (×2): 10 mg via ORAL
  Filled 2024-08-10 (×2): qty 1

## 2024-08-10 MED ORDER — ISOSORBIDE MONONITRATE ER 30 MG PO TB24
30.0000 mg | ORAL_TABLET | Freq: Every day | ORAL | Status: DC
  Administered 2024-08-10 – 2024-08-11 (×2): 30 mg via ORAL
  Filled 2024-08-10 (×2): qty 1

## 2024-08-10 NOTE — Progress Notes (Signed)
 [facility] Interventional Cardiology Note  Date: 08/10/24 Time: 7:58 AM  Subjective: James Arellano felt well immediately after yesterday's catheterization/PCI to the LAD but developed some vague soreness of the chest overnight.  Has a little bit of a pressure-like sensation but is not totally the same as what he felt before intervention.  He rates his pain as 3/10.  He denies shortness of breath and lightheadedness.  Objective: Temp:  [97.5 F (36.4 C)-98.6 F (37 C)] 98.3 F (36.8 C) (10/08 0448) Pulse Rate:  [49-70] 62 (10/08 0448) Resp:  [11-24] 17 (10/08 0448) BP: (117-161)/(62-113) 121/81 (10/08 0448) SpO2:  [95 %-100 %] 97 % (10/08 0448) Weight:  [88.8 kg] 88.8 kg (10/08 0500) General:  NAD. Neck: No JVD or HJR. Lungs: Clear to auscultation bilaterally without wheezes or crackles. Heart: Regular rate and rhythm without murmurs, rubs, or gallops. Abdomen: Soft, nontender, nondistended. Extremities: No lower extremity edema.  Right radial arteriotomy site slightly tender but no hematoma appreciated.  Right radial pulses 2+.  Lab Results  Component Value Date   WBC 7.9 08/10/2024   HGB 12.7 (L) 08/10/2024   HCT 37.2 (L) 08/10/2024   MCV 92.5 08/10/2024   PLT 280 08/10/2024      Latest Ref Rng & Units 08/10/2024    5:46 AM 08/09/2024    5:06 AM 08/08/2024   11:35 AM  BMP  Glucose 70 - 99 mg/dL 890  96  872   BUN 8 - 23 mg/dL 19  24  22    Creatinine 0.61 - 1.24 mg/dL 9.01  9.08  8.67   Sodium 135 - 145 mmol/L 138  138  142   Potassium 3.5 - 5.1 mmol/L 3.8  3.5  3.9   Chloride 98 - 111 mmol/L 106  106  106   CO2 22 - 32 mmol/L 24  24  23    Calcium 8.9 - 10.3 mg/dL 9.0  8.8  8.6    Telemetry: Normal sinus rhythm and sinus bradycardia without arrhythmia.  EKG: Normal sinus rhythm without abnormalities.  Assessment/plan: 63 year old man admitted with syncopal episode in the setting of progressive chest pain and shortness of breath and abnormal coronary CTA consistent  with unstable angina.  He underwent cardiac catheterization and PCI to the ostial through mid LAD yesterday that was complicated by proximal edge dissection after deployment of initial stent extending to the ostium of the LAD.  This was successfully treated with second overlapping stent extending across the ostium of the LAD.  Mild plaque shift versus spasm was noted at the ostium of the LCx following intervention, as well as a small intimal disruption in the LMCA seen on OCT without angiographic evidence of dissection.  He has been stable without EKG changes overnight but complains of some vague soreness in the chest. - Administer nitroglycerin  0.4 mg SL x 1 now to see if this offers any pain relief. - Resume amlodipine  5 mg daily for antianginal therapy and blood pressure control. - Continue dual antiplatelet therapy with aspirin  and prasugrel. - Continue atorvastatin 80 mg daily. - Recommend inpatient monitoring for at least 1 more night given recurrent chest pain and high risk disease with challenging intervention yesterday.  If the patient were to have continued or worsening chest pain or develop hemodynamic/electrical instability, relook catheterization will need to be pursued.  James Hanson, MD [facility]

## 2024-08-10 NOTE — Progress Notes (Addendum)
 Progress Note   Patient: James Arellano FMW:[id number] DOB: 05/26/1961 DOA: 08/08/2024     1 DOS: the patient was seen and examined on 08/10/2024     Brief hospital course: James Arellano is a 63 y.o. male with medical history significant of hypertension, hyperlipidemia, depression/anxiety, family history of CAD, recent abnormal CTA coronary with plans for outpatient cardiac cath underway.  Patient was seen in cardiology clinic today for precath appointment, where patient had a syncopal episode that was preceded by diaphoresis, dizziness and lightheadedness.  Patient's BP at that time was reportedly 71/42, he was started on IV fluids and on arrival to the ED BP had improved to 135/85.  Patient has a normal troponin.  Heart cath scheduled 10/7.     Assessment and Plan: Syncope -due to hypotension  Unstable angina Coronary artery disease Dyslipidemia.  - Recent CTA coronary showed severe CAD with positive FFR in the LAD Multiple medicine was started recently, hypotension could be from medication.  No diarrhea or GI symptoms. Known coronary disease with positive CT angiogram, heart cath scheduled for today. Continue aspirin , LDL increased to 156, increase atorvastatin to 80 mg daily. Patient underwent cardiac cath today     Reported history of prolonged bleeding -no diagnosis of bleeding diathesis but patient and wife report that he seems to bleed for prolonged time even after a minor cut or injury. He has a normal platelet count, normal PT/INR and normal APTT.   Essential hypertension  Blood pressure medicine on hold due to syncope episode.   Dilated aortic root /ascending aortic aneurysm -- Noted on CTA chest: 4.0 cm -- Recommend surveillance with annual imaging   Depression/anxiety -- Resume home Wellbutrin , Trintellix  Xanax  as needed   History of Barrett's esophagus  -- On twice daily PPI     Subjective:  Denies nausea vomiting abdominal pain chest pain   Physical  Exam:   General exam: Appears calm and comfortable  Respiratory system: Clear to auscultation. Respiratory effort normal. Cardiovascular system: S1 & S2 heard, RRR. No JVD, murmurs, rubs, gallops or clicks. No pedal edema. Gastrointestinal system: Abdomen is nondistended, soft and nontender. No organomegaly or masses felt. Normal bowel sounds heard. Central nervous system: Alert and oriented. No focal neurological deficits. Extremities: Symmetric 5 x 5 power. Skin: No rashes, lesions or ulcers Psychiatry: Judgement and insight appear normal. Mood & affect appropriate.      Data Reviewed:   Lab results reviewed.   Family Communication: None   Disposition: Status is: Observation       Vitals:   08/10/24 0448 08/10/24 0500 08/10/24 0813 08/10/24 1137  BP: 121/81  135/80 121/67  Pulse: 62  60 65  Resp: 17   (!) 21  Temp: 98.3 F (36.8 C)  98.4 F (36.9 C) 98.3 F (36.8 C)  TempSrc:   Oral Oral  SpO2: 97%  97% 99%  Weight:  88.8 kg    Height:          Latest Ref Rng & Units 08/10/2024    5:46 AM 08/09/2024    5:04 AM 08/08/2024   11:35 AM  CBC  WBC 4.0 - 10.5 K/uL 7.9  6.8  8.1   Hemoglobin 13.0 - 17.0 g/dL 87.2  87.0  87.4   Hematocrit 39.0 - 52.0 % 37.2  37.6  36.8   Platelets 150 - 400 K/uL 280  292  307        Latest Ref Rng & Units 08/10/2024    5:46  AM 08/09/2024    5:06 AM 08/08/2024   11:35 AM  BMP  Glucose 70 - 99 mg/dL 890  96  872   BUN 8 - 23 mg/dL 19  24  22    Creatinine 0.61 - 1.24 mg/dL 9.01  9.08  8.67   Sodium 135 - 145 mmol/L 138  138  142   Potassium 3.5 - 5.1 mmol/L 3.8  3.5  3.9   Chloride 98 - 111 mmol/L 106  106  106   CO2 22 - 32 mmol/L 24  24  23    Calcium 8.9 - 10.3 mg/dL 9.0  8.8  8.6      Author: Drue ONEIDA Potter, MD 08/10/2024 5:57 PM  For on call review [email or web address].

## 2024-08-10 NOTE — Plan of Care (Signed)
  Problem: Education: Goal: Knowledge of condition and prescribed therapy will improve Outcome: Progressing   Problem: Cardiac: Goal: Will achieve and/or maintain adequate cardiac output Outcome: Progressing   Problem: Physical Regulation: Goal: Complications related to the disease process, condition or treatment will be avoided or minimized Outcome: Progressing   Problem: Education: Goal: Knowledge of General Education information will improve Description: Including pain rating scale, medication(s)/side effects and non-pharmacologic comfort measures Outcome: Progressing   Problem: Health Behavior/Discharge Planning: Goal: Ability to manage health-related needs will improve Outcome: Progressing   Problem: Clinical Measurements: Goal: Ability to maintain clinical measurements within normal limits will improve Outcome: Progressing Goal: Will remain free from infection Outcome: Progressing Goal: Diagnostic test results will improve Outcome: Progressing Goal: Respiratory complications will improve Outcome: Progressing Goal: Cardiovascular complication will be avoided Outcome: Progressing   Problem: Activity: Goal: Risk for activity intolerance will decrease Outcome: Progressing   Problem: Nutrition: Goal: Adequate nutrition will be maintained Outcome: Progressing   Problem: Coping: Goal: Level of anxiety will decrease Outcome: Progressing   Problem: Elimination: Goal: Will not experience complications related to bowel motility Outcome: Progressing Goal: Will not experience complications related to urinary retention Outcome: Progressing   Problem: Pain Managment: Goal: General experience of comfort will improve and/or be controlled Outcome: Progressing   Problem: Safety: Goal: Ability to remain free from injury will improve Outcome: Progressing   Problem: Skin Integrity: Goal: Risk for impaired skin integrity will decrease Outcome: Progressing   Problem:  Education: Goal: Understanding of CV disease, CV risk reduction, and recovery process will improve Outcome: Progressing Goal: Individualized Educational Video(s) Outcome: Progressing   Problem: Activity: Goal: Ability to return to baseline activity level will improve Outcome: Progressing   Problem: Cardiovascular: Goal: Ability to achieve and maintain adequate cardiovascular perfusion will improve Outcome: Progressing Goal: Vascular access site(s) Level 0-1 will be maintained Outcome: Progressing   Problem: Health Behavior/Discharge Planning: Goal: Ability to safely manage health-related needs after discharge will improve Outcome: Progressing

## 2024-08-10 NOTE — Plan of Care (Signed)
   Problem: Education: Goal: Knowledge of condition and prescribed therapy will improve Outcome: Progressing   Problem: Cardiac: Goal: Will achieve and/or maintain adequate cardiac output Outcome: Progressing   Problem: Physical Regulation: Goal: Complications related to the disease process, condition or treatment will be avoided or minimized Outcome: Progressing

## 2024-08-10 NOTE — Progress Notes (Signed)
 Rounding Note   Patient Name: James Arellano Date of Encounter: 08/10/2024  [facility] Cardiologist: Evalene Lunger, MD   Subjective Reports that he felt very well after PCI yesterday Some low-grade chest discomfort this morning 3/10 Long discussion concerning his recent trip to [place], developing chest pain on his way home requiring sublingual nitroglycerin  Recent visit to our office prior to hospitalization, took all of his morning medications on empty stomach including amlodipine , lisinopril , Imdur , metoprolol  succinate  Scheduled Meds:  amLODipine   5 mg Oral Daily   aspirin  EC  81 mg Oral Daily   atorvastatin  80 mg Oral Daily   buPROPion   450 mg Oral Daily   enoxaparin (LOVENOX) injection  40 mg Subcutaneous Q24H   ezetimibe  10 mg Oral Daily   isosorbide  mononitrate  30 mg Oral Daily   pantoprazole  40 mg Oral BID   prasugrel  10 mg Oral Daily   sodium chloride flush  3 mL Intravenous Q12H   sodium chloride flush  3 mL Intravenous Q12H   vortioxetine  HBr  20 mg Oral Daily   Continuous Infusions:  sodium chloride     PRN Meds: sodium chloride, acetaminophen **OR** acetaminophen, ALPRAZolam , alum & mag hydroxide-simeth, bisacodyl, nitroGLYCERIN , senna-docusate, sodium chloride flush   Vital Signs  Vitals:   08/10/24 0448 08/10/24 0500 08/10/24 0813 08/10/24 1137  BP: 121/81  135/80 121/67  Pulse: 62  60 65  Resp: 17   (!) 21  Temp: 98.3 F (36.8 C)  98.4 F (36.9 C) 98.3 F (36.8 C)  TempSrc:   Oral Oral  SpO2: 97%  97% 99%  Weight:  88.8 kg    Height:        Intake/Output Summary (Last 24 hours) at 08/10/2024 1512 Last data filed at 08/10/2024 1426 Gross per 24 hour  Intake 771.44 ml  Output --  Net 771.44 ml      08/10/2024    5:00 AM 08/09/2024    5:00 AM 08/08/2024    9:58 PM  Last 3 Weights  Weight (lbs) 195 lb 12.3 oz 195 lb 15.8 oz 195 lb 15.8 oz  Weight (kg) 88.8 kg 88.9 kg 88.9 kg      Telemetry Normal sinus rhythm- Personally  Reviewed  ECG   - Personally Reviewed  Physical Exam  GEN: No acute distress.   Neck: No JVD Cardiac: RRR, no murmurs, rubs, or gallops.  Respiratory: Clear to auscultation bilaterally. GI: Soft, nontender, non-distended  MS: No edema; No deformity. Neuro:  Nonfocal  Psych: Normal affect   Labs High Sensitivity Troponin:   Recent Labs  Lab 08/08/24 1135 08/08/24 1330  TROPONINIHS 4 4     Chemistry Recent Labs  Lab 08/08/24 1135 08/09/24 0506 08/10/24 0546  NA 142 138 138  K 3.9 3.5 3.8  CL 106 106 106  CO2 23 24 24   GLUCOSE 127* 96 109*  BUN 22 24* 19  CREATININE 1.32* 0.91 0.98  CALCIUM 8.6* 8.8* 9.0  MG  --   --  2.2  PROT 6.4*  --   --   ALBUMIN 3.9  --   --   AST 14*  --   --   ALT 13  --   --   ALKPHOS 43  --   --   BILITOT 1.2  --   --   GFRNONAA >60 >60 >60  ANIONGAP 13 8 8     Lipids  Recent Labs  Lab 08/09/24 0504  CHOL 217*  TRIG 49  HDL 51  LDLCALC 156*  CHOLHDL 4.3    Hematology Recent Labs  Lab 08/08/24 1135 08/09/24 0504 08/10/24 0546  WBC 8.1 6.8 7.9  RBC 3.90* 4.05* 4.02*  HGB 12.5* 12.9* 12.7*  HCT 36.8* 37.6* 37.2*  MCV 94.4 92.8 92.5  MCH 32.1 31.9 31.6  MCHC 34.0 34.3 34.1  RDW 13.2 12.9 12.6  PLT 307 292 280   Thyroid No results for input(s): TSH, FREET4 in the last 168 hours.  BNPNo results for input(s): BNP, PROBNP in the last 168 hours.  DDimer  Recent Labs  Lab 08/08/24 1135  DDIMER 0.67*     Radiology  CARDIAC CATHETERIZATION Result Date: 08/09/2024 Conclusions: Severe single-vessel coronary artery disease with heterogeneous 80-90% proximal LAD stenosis (MLA 1.57 mm) and 50% mid LAD stenosis with component of myocardial bridging.  There is also mild-moderate, nonobstructive disease involving codominant LCx and RCA. Mildly elevated left ventricular filling pressure (LVEDP 18 mmHg). Successful OCT-guided PCI to ostial/mid LAD with overlapping Onyx Frontier 4.0 x 8 mm and 3.5 x 34 mm drug-eluting  stents with 0% residual stenosis and TIMI-3 flow.  Stent across the ostium of the LAD was placed due to proximal edge dissection after initial stent placement. Tiny nonflow limiting dissection involving the mid LMCA apparent only on OCT and not angiography, likely guide catheter induced. Recommendations: Overnight observation. Dual antiplatelet therapy with aspirin  and prasugrel for at least 6 months.  Will send cytochrome P450 2C19 genotype; if the patient is an appropriate clopidogrel responder, would recommend indefinite clopidogrel therapy after completion of initial 6 months of DAPT. Gently postcatheterization hydration and close monitoring of renal function given 290 mL of contrast used for catheterization/intervention. Aggressive secondary prevention of coronary artery disease. Lonni Hanson, MD [facility]  ECHOCARDIOGRAM COMPLETE Result Date: 08/08/2024    ECHOCARDIOGRAM REPORT   Patient Name:   James Arellano Date of Exam: 08/08/2024 Medical Rec #:  [id number]          Height:       72.0 in Accession #:    [id number]         Weight:       198.0 lb Date of Birth:  June 13, 1961          BSA:          2.121 m Patient Age:    63 years           BP:           125/78 mmHg Patient Gender: M                  HR:           51 bpm. Exam Location:  [facility] Procedure: 2D Echo, Cardiac Doppler and Color Doppler (Both Spectral and Color            Flow Doppler were utilized during procedure). Indications:     Syncope R55  History:         Patient has no prior history of Echocardiogram examinations.                  Signs/Symptoms:Syncope; Risk Factors:Hypertension.  Sonographer:     Thea Norlander RCS Referring Phys:  [id number] BURNARD A GRIFFITH Diagnosing Phys: Deatrice Cage MD IMPRESSIONS  1. Left ventricular ejection fraction, by estimation, is 55 to 60%. The left ventricle has normal function. The left ventricle has no regional wall motion abnormalities. Left ventricular diastolic parameters were normal.  2.  Right  ventricular systolic function is normal. The right ventricular size is normal. There is normal pulmonary artery systolic pressure.  3. The mitral valve is normal in structure. No evidence of mitral valve regurgitation. No evidence of mitral stenosis.  4. The aortic valve is normal in structure. Aortic valve regurgitation is mild. Aortic valve sclerosis/calcification is present, without any evidence of aortic stenosis.  5. The inferior vena cava is dilated in size with >50% respiratory variability, suggesting right atrial pressure of 8 mmHg. FINDINGS  Left Ventricle: Left ventricular ejection fraction, by estimation, is 55 to 60%. The left ventricle has normal function. The left ventricle has no regional wall motion abnormalities. The left ventricular internal cavity size was normal in size. There is  no left ventricular hypertrophy. Left ventricular diastolic parameters were normal. Right Ventricle: The right ventricular size is normal. No increase in right ventricular wall thickness. Right ventricular systolic function is normal. There is normal pulmonary artery systolic pressure. The tricuspid regurgitant velocity is 1.69 m/s, and  with an assumed right atrial pressure of 8 mmHg, the estimated right ventricular systolic pressure is 19.4 mmHg. Left Atrium: Left atrial size was normal in size. Right Atrium: Right atrial size was normal in size. Pericardium: There is no evidence of pericardial effusion. Mitral Valve: The mitral valve is normal in structure. No evidence of mitral valve regurgitation. No evidence of mitral valve stenosis. Tricuspid Valve: The tricuspid valve is normal in structure. Tricuspid valve regurgitation is trivial. No evidence of tricuspid stenosis. Aortic Valve: The aortic valve is normal in structure. Aortic valve regurgitation is mild. Aortic regurgitation PHT measures 556 msec. Aortic valve sclerosis/calcification is present, without any evidence of aortic stenosis. Aortic valve peak  gradient measures 8.2 mmHg. Pulmonic Valve: The pulmonic valve was normal in structure. Pulmonic valve regurgitation is mild. No evidence of pulmonic stenosis. Aorta: The aortic root is normal in size and structure. Venous: The inferior vena cava is dilated in size with greater than 50% respiratory variability, suggesting right atrial pressure of 8 mmHg. IAS/Shunts: No atrial level shunt detected by color flow Doppler.  LEFT VENTRICLE PLAX 2D LVIDd:         4.90 cm   Diastology LVIDs:         3.30 cm   LV e' medial:    7.62 cm/s LV PW:         1.10 cm   LV E/e' medial:  7.6 LV IVS:        0.90 cm   LV e' lateral:   10.10 cm/s LVOT diam:     2.40 cm   LV E/e' lateral: 5.8 LV SV:         101 LV SV Index:   48 LVOT Area:     4.52 cm  RIGHT VENTRICLE             IVC RV S prime:     13.50 cm/s  IVC diam: 2.30 cm TAPSE (M-mode): 2.2 cm LEFT ATRIUM             Index        RIGHT ATRIUM           Index LA diam:        2.90 cm 1.37 cm/m   RA Area:     22.70 cm LA Vol (A2C):   43.4 ml 20.46 ml/m  RA Volume:   74.70 ml  35.21 ml/m LA Vol (A4C):   33.9 ml 15.98 ml/m LA Biplane Vol: 39.8 ml  18.76 ml/m  AORTIC VALVE                 PULMONIC VALVE AV Area (Vmax): 3.03 cm     PR End Diast Vel: 3.43 msec AV Vmax:        143.00 cm/s AV Peak Grad:   8.2 mmHg LVOT Vmax:      95.90 cm/s LVOT Vmean:     61.300 cm/s LVOT VTI:       0.223 m AI PHT:         556 msec  AORTA Ao Root diam: 4.00 cm Ao Asc diam:  3.50 cm MITRAL VALVE               TRICUSPID VALVE MV Area (PHT): 4.06 cm    TR Peak grad:   11.4 mmHg MV Decel Time: 187 msec    TR Vmax:        169.00 cm/s MR Peak grad: 13.1 mmHg MR Vmax:      181.00 cm/s  SHUNTS MV E velocity: 58.20 cm/s  Systemic VTI:  0.22 m MV A velocity: 53.50 cm/s  Systemic Diam: 2.40 cm MV E/A ratio:  1.09 Deatrice Cage MD Electronically signed by Deatrice Cage MD Signature Date/Time: 08/08/2024/5:39:59 PM    Final     Cardiac Studies   Patient Profile   63 y.o. male with coronary disease,  cardiac CTA as outpatient detailing severe LAD disease Cardiac catheterization yesterday with stenting to proximal LAD  Assessment & Plan  Coronary artery disease with stable angina Reports that he felt really well after stenting yesterday Some mild soreness in his chest low grade 3/10 EKG unchanged  restarted on amlodipine  5 this morning, Recommend we restart isosorbide  30 On aspirin , Effient -Plan to watch additional 24 hours -For worsening chest pain may need additional evaluation  Hyperlipidemia Started on Praluent, Zetia Goal LDL less than 55  Essential hypertension Continue amlodipine  5, isosorbide  30 daily Metoprolol  and lisinopril  currently on hold  Syncope Recent episode after taking amlodipine , isosorbide , metoprolol , lisinopril  in the morning prior to his doctor visit, no breakfast  For questions or updates, please contact [facility] Please consult [email or web address] for contact info under     Signed, Shashana Fullington, MD  08/10/2024, 3:12 PM

## 2024-08-11 ENCOUNTER — Other Ambulatory Visit: Payer: Self-pay

## 2024-08-11 ENCOUNTER — Telehealth: Payer: Self-pay | Admitting: Cardiovascular Disease

## 2024-08-11 DIAGNOSIS — Z955 Presence of coronary angioplasty implant and graft: Secondary | ICD-10-CM

## 2024-08-11 LAB — CBC WITH DIFFERENTIAL/PLATELET
Abs Immature Granulocytes: 0.02 K/uL (ref 0.00–0.07)
Basophils Absolute: 0.1 K/uL (ref 0.0–0.1)
Basophils Relative: 1 %
Eosinophils Absolute: 0.3 K/uL (ref 0.0–0.5)
Eosinophils Relative: 4 %
HCT: 35.4 % — ABNORMAL LOW (ref 39.0–52.0)
Hemoglobin: 12.1 g/dL — ABNORMAL LOW (ref 13.0–17.0)
Immature Granulocytes: 0 %
Lymphocytes Relative: 20 %
Lymphs Abs: 1.4 K/uL (ref 0.7–4.0)
MCH: 32.3 pg (ref 26.0–34.0)
MCHC: 34.2 g/dL (ref 30.0–36.0)
MCV: 94.4 fL (ref 80.0–100.0)
Monocytes Absolute: 0.6 K/uL (ref 0.1–1.0)
Monocytes Relative: 8 %
Neutro Abs: 4.6 K/uL (ref 1.7–7.7)
Neutrophils Relative %: 67 %
Platelets: 274 K/uL (ref 150–400)
RBC: 3.75 MIL/uL — ABNORMAL LOW (ref 4.22–5.81)
RDW: 12.7 % (ref 11.5–15.5)
WBC: 6.9 K/uL (ref 4.0–10.5)
nRBC: 0 % (ref 0.0–0.2)

## 2024-08-11 LAB — LIPOPROTEIN A (LPA): Lipoprotein (a): 340.9 nmol/L — ABNORMAL HIGH (ref <75.0)

## 2024-08-11 LAB — BASIC METABOLIC PANEL WITH GFR
Anion gap: 8 (ref 5–15)
BUN: 28 mg/dL — ABNORMAL HIGH (ref 8–23)
CO2: 26 mmol/L (ref 22–32)
Calcium: 8.9 mg/dL (ref 8.9–10.3)
Chloride: 105 mmol/L (ref 98–111)
Creatinine, Ser: 1.12 mg/dL (ref 0.61–1.24)
GFR, Estimated: >60 mL/min (ref >60)
Glucose, Bld: 104 mg/dL — ABNORMAL HIGH (ref 70–99)
Potassium: 3.8 mmol/L (ref 3.5–5.1)
Sodium: 139 mmol/L (ref 135–145)

## 2024-08-11 MED ORDER — PRASUGREL HCL 10 MG PO TABS
10.0000 mg | ORAL_TABLET | Freq: Every day | ORAL | 12 refills | Status: DC
  Filled 2024-08-11: qty 30, 30d supply, fill #0

## 2024-08-11 MED ORDER — EZETIMIBE 10 MG PO TABS
10.0000 mg | ORAL_TABLET | Freq: Every day | ORAL | 1 refills | Status: DC
  Filled 2024-08-11: qty 30, 30d supply, fill #0

## 2024-08-11 MED ORDER — ATORVASTATIN CALCIUM 80 MG PO TABS
80.0000 mg | ORAL_TABLET | Freq: Every day | ORAL | 1 refills | Status: DC
  Filled 2024-08-11: qty 30, 30d supply, fill #0

## 2024-08-11 NOTE — Discharge Instructions (Signed)
 Cardiology should call to make your follow up appointment within 2 weeks.   May return to work in 1 week.   Do not lift anything heavy or use right arm for anything strenuous.

## 2024-08-11 NOTE — Plan of Care (Signed)
  Problem: Education: Goal: Knowledge of condition and prescribed therapy will improve Outcome: Progressing   Problem: Cardiac: Goal: Will achieve and/or maintain adequate cardiac output Outcome: Progressing   Problem: Physical Regulation: Goal: Complications related to the disease process, condition or treatment will be avoided or minimized Outcome: Progressing   Problem: Education: Goal: Knowledge of General Education information will improve Description: Including pain rating scale, medication(s)/side effects and non-pharmacologic comfort measures Outcome: Progressing   Problem: Health Behavior/Discharge Planning: Goal: Ability to manage health-related needs will improve Outcome: Progressing   Problem: Clinical Measurements: Goal: Ability to maintain clinical measurements within normal limits will improve Outcome: Progressing Goal: Will remain free from infection Outcome: Progressing Goal: Diagnostic test results will improve Outcome: Progressing Goal: Respiratory complications will improve Outcome: Progressing Goal: Cardiovascular complication will be avoided Outcome: Progressing   Problem: Activity: Goal: Risk for activity intolerance will decrease Outcome: Progressing   Problem: Nutrition: Goal: Adequate nutrition will be maintained Outcome: Progressing   Problem: Coping: Goal: Level of anxiety will decrease Outcome: Progressing   Problem: Elimination: Goal: Will not experience complications related to bowel motility Outcome: Progressing Goal: Will not experience complications related to urinary retention Outcome: Progressing   Problem: Pain Managment: Goal: General experience of comfort will improve and/or be controlled Outcome: Progressing   Problem: Safety: Goal: Ability to remain free from injury will improve Outcome: Progressing   Problem: Skin Integrity: Goal: Risk for impaired skin integrity will decrease Outcome: Progressing   Problem:  Education: Goal: Understanding of CV disease, CV risk reduction, and recovery process will improve Outcome: Progressing Goal: Individualized Educational Video(s) Outcome: Progressing   Problem: Activity: Goal: Ability to return to baseline activity level will improve Outcome: Progressing   Problem: Cardiovascular: Goal: Ability to achieve and maintain adequate cardiovascular perfusion will improve Outcome: Progressing Goal: Vascular access site(s) Level 0-1 will be maintained Outcome: Progressing   Problem: Health Behavior/Discharge Planning: Goal: Ability to safely manage health-related needs after discharge will improve Outcome: Progressing

## 2024-08-11 NOTE — Telephone Encounter (Signed)
-----   Message from Nurse Russell ORN sent at 08/11/2024  8:56 AM EDT ----- Regarding: FW: hosp follow-up  ----- Message ----- From: Franchester Mikey DEL, PA-C Sent: 08/11/2024   8:44 AM EDT To: [facility] Triage; [facility] Scheduling Subject: hosp follow-up                                 Patient needs hospital follow-up in 2 weeks

## 2024-08-11 NOTE — Progress Notes (Signed)
  Progress Note  Patient Name: James Arellano Date of Encounter: 08/11/2024 [facility] Cardiologist: Evalene Lunger, MD   Interval Summary    Labs stable this AM. The patient denies further chest pain. He is ready for discharge.   Vital Signs Vitals:   08/10/24 1137 08/10/24 1957 08/11/24 0350 08/11/24 0500  BP: 121/67 116/80 137/85   Pulse: 65 62 62   Resp: (!) 21 20 20    Temp: 98.3 F (36.8 C) 98.7 F (37.1 C) 98.4 F (36.9 C)   TempSrc: Oral     SpO2: 99% 97% 96%   Weight:    90.4 kg  Height:        Intake/Output Summary (Last 24 hours) at 08/11/2024 0744 Last data filed at 08/10/2024 1921 Gross per 24 hour  Intake 780 ml  Output --  Net 780 ml      08/11/2024    5:00 AM 08/10/2024    5:00 AM 08/09/2024    5:00 AM  Last 3 Weights  Weight (lbs) 199 lb 4.7 oz 195 lb 12.3 oz 195 lb 15.8 oz  Weight (kg) 90.4 kg 88.8 kg 88.9 kg      Telemetry/ECG  SR/SB HR 50-60s - Personally Reviewed  Physical Exam  GEN: No acute distress.   Neck: No JVD Cardiac: RRR, no murmurs, rubs, or gallops.  Respiratory: Clear to auscultation bilaterally. GI: Soft, nontender, non-distended  MS: No edema  Assessment & Plan   CAD with stable angina - improved chest pain after stenting ostial/midLAD. Otherwise nonobstructive disease - patient denies further chest pain. 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

## 2024-08-11 NOTE — Discharge Summary (Signed)
 Physician Discharge Summary   Patient: James Arellano MRN: [id number] DOB: [birth year]  Admit date:     08/08/2024  Discharge date: 08/11/24  Discharge Physician: Drue ONEIDA Potter   PCP: Sharma Coyer, MD   Recommendations at discharge:  Follow-up with cardiology  Discharge Diagnoses: Syncope -due to hypotension  Unstable angina Coronary artery disease Dyslipidemia. Reported history of prolonged bleeding  Essential hypertension  Dilated aortic root /ascending aortic aneurysm Depression/anxiety History of Barrett's esophagus  Hospital Course: From HPI REYN FAIVRE is a 63 y.o. male with medical history significant of hypertension, hyperlipidemia, depression/anxiety, family history of CAD, recent abnormal CTA coronary with plans for outpatient cardiac cath underway.  Patient was seen in cardiology clinic today for precath appointment, where patient had a syncopal episode that was preceded by diaphoresis, dizziness and lightheadedness.  Patient's BP at that time was reportedly 71/42, he was started on IV fluids and on arrival to the ED BP had improved to 135/85.  Patient has a normal troponin.  Heart cath scheduled 10/7.    Assessment and Plan: Syncope -due to hypotension  Unstable angina Coronary artery disease Dyslipidemia.  - Recent CTA coronary showed severe CAD with positive FFR in the LAD Multiple medicine was started recently, hypotension could be from medication.  No diarrhea or GI symptoms. Known coronary disease with positive CT angiogram Continue aspirin , LDL increased to 156, increase atorvastatin to 80 mg daily. Status post cardiac cath     Reported history of prolonged bleeding -no diagnosis of bleeding diathesis but patient and wife report that he seems to bleed for prolonged time even after a minor cut or injury. He has a normal platelet count, normal PT/INR and normal APTT.   Essential hypertension  Continue amlodipine  and Imdur    Dilated  aortic root /ascending aortic aneurysm -- Noted on CTA chest: 4.0 cm -- Recommend surveillance with annual imaging   Depression/anxiety -- Resumed home Wellbutrin , Trintellix  Xanax  as needed   History of Barrett's esophagus  -- On twice daily PPI      Consultants: Cardiology Procedures performed: As mentioned above Disposition: Home Diet recommendation:  Cardiac diet DISCHARGE MEDICATION: Allergies as of 08/11/2024   No Known Allergies      Medication List     STOP taking these medications    lisinopril  20 MG tablet Commonly known as: ZESTRIL    lisinopril  40 MG tablet Commonly known as: ZESTRIL    metoprolol  succinate 25 MG 24 hr tablet Commonly known as: TOPROL -XL   metoprolol  tartrate 100 MG tablet Commonly known as: Lopressor    sildenafil  50 MG tablet Commonly known as: VIAGRA        TAKE these medications    ALPRAZolam  0.5 MG tablet Commonly known as: XANAX  TAKE 1 TABLET BY MOUTH TWICE  DAILY   amLODipine  5 MG tablet Commonly known as: NORVASC  Take 1 tablet (5 mg total) by mouth daily.   aspirin  EC 81 MG tablet Take 1 tablet (81 mg total) by mouth daily. Swallow whole.   atorvastatin 80 MG tablet Commonly known as: LIPITOR Take 1 tablet (80 mg total) by mouth daily. Start taking on: August 12, 2024   buPROPion  150 MG 24 hr tablet Commonly known as: WELLBUTRIN  XL Take 3 tablets (450 mg total) by mouth daily.   ezetimibe 10 MG tablet Commonly known as: ZETIA Take 1 tablet (10 mg total) by mouth daily. Start taking on: August 12, 2024   isosorbide  mononitrate 30 MG 24 hr tablet Commonly known as: IMDUR  Take 1 tablet (  30 mg total) by mouth daily.   nitroGLYCERIN  0.4 MG SL tablet Commonly known as: NITROSTAT  Place 1 tablet (0.4 mg total) under the tongue every 5 (five) minutes as needed for chest pain.   omeprazole 40 MG capsule Commonly known as: PRILOSEC Take 40 mg by mouth 2 (two) times daily.   prasugrel 10 MG Tabs  tablet Commonly known as: EFFIENT Take 1 tablet (10 mg total) by mouth daily. Start taking on: August 12, 2024   vortioxetine  HBr 20 MG Tabs tablet Commonly known as: Trintellix  Take 1 tablet (20 mg total) by mouth daily.        Discharge Exam: Filed Weights   08/09/24 0500 08/10/24 0500 08/11/24 0500  Weight: 88.9 kg 88.8 kg 90.4 kg   General exam: Appears calm and comfortable  Respiratory system: Clear to auscultation. Respiratory effort normal. Cardiovascular system: S1 & S2 heard, RRR. No JVD, murmurs, rubs, gallops or clicks. No pedal edema. Gastrointestinal system: Abdomen is nondistended, soft and nontender. No organomegaly or masses felt. Normal bowel sounds heard. Central nervous system: Alert and oriented. No focal neurological deficits. Extremities: Symmetric 5 x 5 power. Skin: No rashes, lesions or ulcers Psychiatry: Judgement and insight appear normal. Mood & affect appropriate.     Condition at discharge: good  The results of significant diagnostics from this hospitalization (including imaging, microbiology, ancillary and laboratory) are listed below for reference.   Imaging Studies: CARDIAC CATHETERIZATION Result Date: 08/09/2024 Conclusions: Severe single-vessel coronary artery disease with heterogeneous 80-90% proximal LAD stenosis (MLA 1.57 mm) and 50% mid LAD stenosis with component of myocardial bridging.  There is also mild-moderate, nonobstructive disease involving codominant LCx and RCA. Mildly elevated left ventricular filling pressure (LVEDP 18 mmHg). Successful OCT-guided PCI to ostial/mid LAD with overlapping Onyx Frontier 4.0 x 8 mm and 3.5 x 34 mm drug-eluting stents with 0% residual stenosis and TIMI-3 flow.  Stent across the ostium of the LAD was placed due to proximal edge dissection after initial stent placement. Tiny nonflow limiting dissection involving the mid LMCA apparent only on OCT and not angiography, likely guide catheter induced.  Recommendations: Overnight observation. Dual antiplatelet therapy with aspirin  and prasugrel for at least 6 months.  Will send cytochrome P450 2C19 genotype; if the patient is an appropriate clopidogrel responder, would recommend indefinite clopidogrel therapy after completion of initial 6 months of DAPT. Gently postcatheterization hydration and close monitoring of renal function given 290 mL of contrast used for catheterization/intervention. Aggressive secondary prevention of coronary artery disease. Lonni Hanson, MD [facility]  ECHOCARDIOGRAM COMPLETE Result Date: 08/08/2024    ECHOCARDIOGRAM REPORT   Patient Name:   YEISON SIPPEL Date of Exam: 08/08/2024 Medical Rec #:  [id number]          Height:       72.0 in Accession #:    [id number]         Weight:       198.0 lb Date of Birth:  05-23-1961          BSA:          2.121 m Patient Age:    63 years           BP:           125/78 mmHg Patient Gender: M                  HR:           51 bpm. Exam Location:  [facility] Procedure: 2D Echo,  Cardiac Doppler and Color Doppler (Both Spectral and Color            Flow Doppler were utilized during procedure). Indications:     Syncope R55  History:         Patient has no prior history of Echocardiogram examinations.                  Signs/Symptoms:Syncope; Risk Factors:Hypertension.  Sonographer:     Thea Norlander RCS Referring Phys:  [id number] BURNARD A GRIFFITH Diagnosing Phys: Deatrice Cage MD IMPRESSIONS  1. Left ventricular ejection fraction, by estimation, is 55 to 60%. The left ventricle has normal function. The left ventricle has no regional wall motion abnormalities. Left ventricular diastolic parameters were normal.  2. Right ventricular systolic function is normal. The right ventricular size is normal. There is normal pulmonary artery systolic pressure.  3. The mitral valve is normal in structure. No evidence of mitral valve regurgitation. No evidence of mitral stenosis.  4. The aortic valve is normal in  structure. Aortic valve regurgitation is mild. Aortic valve sclerosis/calcification is present, without any evidence of aortic stenosis.  5. The inferior vena cava is dilated in size with >50% respiratory variability, suggesting right atrial pressure of 8 mmHg. FINDINGS  Left Ventricle: Left ventricular ejection fraction, by estimation, is 55 to 60%. The left ventricle has normal function. The left ventricle has no regional wall motion abnormalities. The left ventricular internal cavity size was normal in size. There is  no left ventricular hypertrophy. Left ventricular diastolic parameters were normal. Right Ventricle: The right ventricular size is normal. No increase in right ventricular wall thickness. Right ventricular systolic function is normal. There is normal pulmonary artery systolic pressure. The tricuspid regurgitant velocity is 1.69 m/s, and  with an assumed right atrial pressure of 8 mmHg, the estimated right ventricular systolic pressure is 19.4 mmHg. Left Atrium: Left atrial size was normal in size. Right Atrium: Right atrial size was normal in size. Pericardium: There is no evidence of pericardial effusion. Mitral Valve: The mitral valve is normal in structure. No evidence of mitral valve regurgitation. No evidence of mitral valve stenosis. Tricuspid Valve: The tricuspid valve is normal in structure. Tricuspid valve regurgitation is trivial. No evidence of tricuspid stenosis. Aortic Valve: The aortic valve is normal in structure. Aortic valve regurgitation is mild. Aortic regurgitation PHT measures 556 msec. Aortic valve sclerosis/calcification is present, without any evidence of aortic stenosis. Aortic valve peak gradient measures 8.2 mmHg. Pulmonic Valve: The pulmonic valve was normal in structure. Pulmonic valve regurgitation is mild. No evidence of pulmonic stenosis. Aorta: The aortic root is normal in size and structure. Venous: The inferior vena cava is dilated in size with greater than 50%  respiratory variability, suggesting right atrial pressure of 8 mmHg. IAS/Shunts: No atrial level shunt detected by color flow Doppler.  LEFT VENTRICLE PLAX 2D LVIDd:         4.90 cm   Diastology LVIDs:         3.30 cm   LV e' medial:    7.62 cm/s LV PW:         1.10 cm   LV E/e' medial:  7.6 LV IVS:        0.90 cm   LV e' lateral:   10.10 cm/s LVOT diam:     2.40 cm   LV E/e' lateral: 5.8 LV SV:         101 LV SV Index:   48 LVOT Area:  4.52 cm  RIGHT VENTRICLE             IVC RV S prime:     13.50 cm/s  IVC diam: 2.30 cm TAPSE (M-mode): 2.2 cm LEFT ATRIUM             Index        RIGHT ATRIUM           Index LA diam:        2.90 cm 1.37 cm/m   RA Area:     22.70 cm LA Vol (A2C):   43.4 ml 20.46 ml/m  RA Volume:   74.70 ml  35.21 ml/m LA Vol (A4C):   33.9 ml 15.98 ml/m LA Biplane Vol: 39.8 ml 18.76 ml/m  AORTIC VALVE                 PULMONIC VALVE AV Area (Vmax): 3.03 cm     PR End Diast Vel: 3.43 msec AV Vmax:        143.00 cm/s AV Peak Grad:   8.2 mmHg LVOT Vmax:      95.90 cm/s LVOT Vmean:     61.300 cm/s LVOT VTI:       0.223 m AI PHT:         556 msec  AORTA Ao Root diam: 4.00 cm Ao Asc diam:  3.50 cm MITRAL VALVE               TRICUSPID VALVE MV Area (PHT): 4.06 cm    TR Peak grad:   11.4 mmHg MV Decel Time: 187 msec    TR Vmax:        169.00 cm/s MR Peak grad: 13.1 mmHg MR Vmax:      181.00 cm/s  SHUNTS MV E velocity: 58.20 cm/s  Systemic VTI:  0.22 m MV A velocity: 53.50 cm/s  Systemic Diam: 2.40 cm MV E/A ratio:  1.09 Deatrice Cage MD Electronically signed by Deatrice Cage MD Signature Date/Time: 08/08/2024/5:39:59 PM    Final    CT Angio Chest PE W/Cm &/Or Wo Cm Result Date: 08/08/2024 CLINICAL DATA:  Syncope. Low to intermediate probability for pulmonary embolus. EXAM: CT ANGIOGRAPHY CHEST WITH CONTRAST TECHNIQUE: Multidetector CT imaging of the chest was performed using the standard protocol during bolus administration of intravenous contrast. Multiplanar CT image reconstructions and MIPs  were obtained to evaluate the vascular anatomy. RADIATION DOSE REDUCTION: This exam was performed according to the departmental dose-optimization program which includes automated exposure control, adjustment of the mA and/or kV according to patient size and/or use of iterative reconstruction technique. CONTRAST:  75mL OMNIPAQUE  IOHEXOL  350 MG/ML SOLN COMPARISON:  None Available. FINDINGS: Cardiovascular: Negative for pulmonary embolus. Atherosclerotic calcification of the aorta with age advanced involvement of all 3 coronary arteries. Ascending aorta measures 4.0 cm (coronal image 59). Heart is at the upper limits of normal in size to mildly enlarged. No pericardial effusion. Mediastinum/Nodes: No pathologically enlarged mediastinal or left hilar lymph nodes. Borderline enlarged 1.4 cm right hilar lymph node, nonspecific. No axillary adenopathy. Bilateral gynecomastia. Esophagus is grossly unremarkable. Lungs/Pleura: Minimal dependent atelectasis bilaterally. No pleural fluid. Airway is unremarkable. Upper Abdomen: Visualized portions of the liver, gallbladder, adrenal glands, kidneys, spleen, pancreas, stomach and bowel are grossly unremarkable. No upper abdominal adenopathy. Musculoskeletal: Degenerative changes in the spine. Review of the MIP images confirms the above findings. IMPRESSION: 1. Negative for pulmonary embolus. 2.  Age advanced three-vessel coronary artery calcification. 3. 4.0 cm ascending aortic aneurysm. Recommend annual imaging  followup by CTA or MRA. This recommendation follows 2010 ACCF/AHA/AATS/ACR/ASA/SCA/SCAI/SIR/STS/SVM Guidelines for the Diagnosis and Management of Patients with Thoracic Aortic Disease. Circulation. 2010; 121: Z733-z630. Aortic aneurysm NOS (ICD10-I71.9). 4.  Aortic atherosclerosis (ICD10-I70.0). Electronically Signed   By: Newell Eke M.D.   On: 08/08/2024 14:20   DG Chest 1 View Result Date: 08/08/2024 EXAM: 1 VIEW(S) XRAY OF THE CHEST 08/08/2024 11:51:00 AM  COMPARISON: None available. CLINICAL HISTORY: cp. Syncopal episode today. Hx of HTN. Per ED note, patient needs heart cath. FINDINGS: LUNGS AND PLEURA: No focal pulmonary opacity. No pulmonary edema. No pleural effusion. No pneumothorax. HEART AND MEDIASTINUM: No acute abnormality of the cardiac and mediastinal silhouettes. BONES AND SOFT TISSUES: No acute osseous abnormality. IMPRESSION: 1. No acute abnormalities. Electronically signed by: Ryan Salvage MD 08/08/2024 12:45 PM EDT RP Workstation: [id number]   CT CORONARY MORPH W/CTA COR W/SCORE W/CA W/CM &/OR WO/CM Addendum Date: 08/05/2024 ADDENDUM REPORT: 08/05/2024 20:47 EXAM: OVER-READ INTERPRETATION  CT CHEST The following report is an over-read performed by radiologist Dr. Selinda Saas [facility] on 08/05/2024. This over-read does not include interpretation of cardiac or coronary anatomy or pathology. The coronary CTA interpretation by the cardiologist is attached. COMPARISON:  05/10/2009 chest radiograph. FINDINGS: Cardiovascular: Normal heart size. No significant pericardial effusion/thickening. Great vessels are normal in course and caliber. No central pulmonary emboli. Mediastinum/Nodes: Unremarkable esophagus. No pathologically enlarged mediastinal or hilar lymph nodes. Lungs/Pleura: No pneumothorax. No pleural effusion. No acute consolidative airspace disease, lung masses or significant pulmonary nodules. Upper abdomen: No acute abnormality. Musculoskeletal: No aggressive appearing focal osseous lesions. Symmetric mild bilateral gynecomastia. Mild thoracic spondylosis. IMPRESSION: Symmetric mild bilateral gynecomastia. Otherwise no significant extracardiac findings. Electronically Signed   By: Selinda DELENA Blue M.D.   On: 08/05/2024 20:47   Result Date: 08/05/2024 HISTORY: Chest pain/anginal equiv, intermediate CAD risk, not treadmill candidate EXAM: Cardiac/Coronary  CT PROTOCOL: A non-contrast, gated CT scan was obtained with axial  slices of 2.5 mm through the heart for calcium scoring. Calcium scoring was performed using the Agatston method. A 120 kV prospective, gated, contrast cardiac CT scan was obtained. Gantry rotation speed was 230 msec and collimation was 0.63 mm. Two sublingual nitroglycerin  tablets (0.8 mg) were given. The 3D data set was reconstructed with motion correction for the best systolic or diastolic phase. Images were analyzed on a dedicated workstation using MPR, MIP, and VRT modes. The patient received 95 cc of contrast. FINDINGS: Image quality: Average. Artifact: Limited. Coronary calcium score is 1899, which places the patient in the 98th percentile for age and sex matched control. Coronary arteries: Normal coronary origins.  Co-dominance. Left Main Coronary Artery: Normal caliber vessel, originates from the left coronary cusp, trifurcates into a left anterior descending artery (LAD), left circumflex artery (LCX), and ramus intermedius (RI). Left main is patent. Left Anterior Descending Artery: Normal caliber vessel, reaches the apex, gives off 1 diagonal branches. 1-24% calcified plaque in ostial LAD, 70-99% mixed plaque in proximal LAD (aneurysmal segment), 25-49% mixed plaque in proximal to mid LAD, luminal irregularities (<24%) in distal to apical segment. First Diagonal branch: 25-49% calcified plaque in ostial segment Ramus intermedius: Normal caliber, large size, 25-49% mixed plaque in proximal segment Left Circumflex Artery: Normal caliber vessel, co-dominant, travels within the atrioventricular groove, gives off 3 obtuse marginal branches. 1-24% calcified plaque in ostial and proximal segment remainder of the vessel is patent. First Obtuse Marginal branch: 1-24% calcified plaque in proximal segment Second Obtuse Marginal branch: Patent Third Obtuse Marginal branch: Patent  Right Coronary Artery: Co-Dominant vessel, originates from the right coronary cusp. 1-24% calcified plaque in ostial/proximal RCA, 50-69%  calcified plaque in proximal RCA, diffuse luminal irregularities due to mixed plaque proximal / mid RCA, 50-69% mixed plaque in distal RCA. 50-69% non calcified plaque in proximal acute marginal branch. Aorta: Evidence of ascending aortic dilation, 42.71mm at the mid ascending aorta (level of the PA bifurcation) measured double oblique. Aortic atherosclerosis. Aortic Valve: Native valve, trileaflet aortic valve, no significant calcification. Mitral valve: Native valve, no significant mitral annular calcification. Other findings: Normal pulmonary vein drainage into the left atrium. Normal left atrial appendage without thrombus. Normal size of the pulmonary artery. Please see separate report from [facility] for non-cardiac findings. IMPRESSION: 1. Coronary calcium score of 1899. This was 98th percentile for age and sex matched control. 2. Normal coronary origins with co-dominance. 3. CAD-RADS 4 Severe CAD. 4. 70-99% mixed plaque in proximal LAD (aneurysmal segment). 5. 25-49% mixed plaque in proximal RI segment. 6. 1-24% calcified plaque in ostial and proximal LCx segment. 7. 50-69% calcified plaque in proximal RCA. See above for details. 8. CT FFR will be performed and reported separately. 9. Evidence of ascending aortic dilation, 42.22mm. Consider secondary imaging modality (echocardiogram, CTA Aorta Protocol, MRA Aorta Protocol) in 6 months if clinically indicated. 10.  Aortic atherosclerosis. RECOMMENDATION: Await results of CTFFR. Consider symptom-guided anti-ischemic pharmacotherapy as well as risk factor modification per guideline directed care. If active chest pain or symptoms refractory to medical therapy cardiac catheterization is recommended as long as no contraindication. Clinical correlation required. Electronically Signed: By: Madonna Large On: 07/28/2024 23:59   CT CORONARY FFR DATA PREP & FLUID ANALYSIS Result Date: 07/29/2024 EXAM: CT FFR ANALYSIS CLINICAL DATA:  chest pain FINDINGS: FFRct  analysis was performed on the original cardiac CT angiogram dataset. Diagrammatic representation of the FFRct analysis is provided in a separate PDF document in PACS. This dictation was created using the PDF document and an interactive 3D model of the results. 3D model is not available in the EMR/PACS. Normal FFR range is >0.80. Indeterminate (grey) zone is 0.76-0.80. FFR delta of 0.13 is considered significant. 1. Left Main: FFR = 0.99 2. LAD: Proximal FFR = 0.98, mid FFR = 0.80, Distal FFR = 0.70 3. D1:Proximal FFR = 0.78, Distal FFR = 0.71 4. Ramus: Proximal FFR = 0.98, distal FFR = 0.95 5. LCX: Proximal FFR = 0.98, distal FFR = 0.94 6. OM1: Proximal FFR = 0.97, distal FFR = 0.96 7. RCA: Proximal FFR = 0.98, mid FFR =0.94, Distal FFR = 0.91 IMPRESSION: 1.  CT FFR analysis showed significant stenosis proximal LAD. 2. CT-FFR valves in the diagonal branch are in the indeterminate zone. Its likely a combination of both native disease and flow degradation due to LAD disease. Recommend invasive hemodynamics to evaluate if its hemodynamically significant. RECOMMENDATIONS: Invasive angiography recommended if no contraindications. Guideline-directed medical therapy and aggressive risk factor modification for secondary prevention of coronary artery disease. Up titrate lipid lowering agents. Electronically Signed   By: Madonna Large   On: 07/29/2024 00:15    Microbiology: Results for orders placed or performed in visit on 06/23/16  Cdiff NAA+O+P+Stool Culture     Status: None   Collection Time: 06/23/16  6:00 AM   ST  Result Value Ref Range Status   Salmonella/Shigella Screen Final report  Final   Stool Culture result 1 (RSASHR) Comment  Final    Comment: No Salmonella or Shigella recovered.   Campylobacter Culture Final report  Final   Stool Culture result 1 (CMPCXR) Comment  Final    Comment: No Campylobacter species isolated.   E coli, Shiga toxin Assay Negative Negative Final   OVA + PARASITE EXAM Final  report  Final    Comment: These results were obtained using wet preparation(s) and trichrome stained smear. This test does not include testing for Cryptosporidium parvum, Cyclospora, or Microsporidia.    O&P result 1 Comment  Final    Comment: No ova, cysts, or parasites seen. One negative specimen does not rule out the possibility of a parasitic infection.    Toxigenic C. Difficile by PCR CANCELED      Comment: Specimen frozen. Please resubmit at room temperature if clinically indicated.  Result canceled by the ancillary     Labs: CBC: Recent Labs  Lab 08/08/24 1135 08/09/24 0504 08/10/24 0546 08/11/24 0203  WBC 8.1 6.8 7.9 6.9  NEUTROABS  --   --   --  4.6  HGB 12.5* 12.9* 12.7* 12.1*  HCT 36.8* 37.6* 37.2* 35.4*  MCV 94.4 92.8 92.5 94.4  PLT 307 292 280 274   Basic Metabolic Panel: Recent Labs  Lab 08/08/24 1135 08/09/24 0506 08/10/24 0546 08/11/24 0203  NA 142 138 138 139  K 3.9 3.5 3.8 3.8  CL 106 106 106 105  CO2 23 24 24 26   GLUCOSE 127* 96 109* 104*  BUN 22 24* 19 28*  CREATININE 1.32* 0.91 0.98 1.12  CALCIUM 8.6* 8.8* 9.0 8.9  MG  --   --  2.2  --    Liver Function Tests: Recent Labs  Lab 08/08/24 1135  AST 14*  ALT 13  ALKPHOS 43  BILITOT 1.2  PROT 6.4*  ALBUMIN 3.9   CBG: No results for input(s): GLUCAP in the last 168 hours.  Discharge time spent:  35 minutes.  Signed: Drue ONEIDA Potter, MD [facility] 08/11/2024

## 2024-08-11 NOTE — Telephone Encounter (Signed)
Left message on VM to schedule.

## 2024-08-11 NOTE — Progress Notes (Signed)
 Discharge instructions given to and reviewed with patient. Patient verbalized understanding of all discharge instructions including follow up care, activity restrictions and changes to home medications. Patient taken to medical mall for discharge by volunteer. Son driving patient home.

## 2024-08-12 ENCOUNTER — Encounter: Payer: Self-pay | Admitting: Cardiovascular Disease

## 2024-08-12 ENCOUNTER — Telehealth: Payer: Self-pay | Admitting: Cardiovascular Disease

## 2024-08-12 NOTE — Telephone Encounter (Signed)
 Called patient and reviewed medication list/changes from hospital ED discharge - patient verbalized understanding

## 2024-08-12 NOTE — Telephone Encounter (Signed)
 Pt returning call to schedule hospital visit, scheduled 10/21 with C. Franchester. Pt would also like a callback in regard to a VM he received to discuss medication management. Please advise.

## 2024-08-22 ENCOUNTER — Encounter: Payer: Self-pay | Admitting: Medical

## 2024-08-22 ENCOUNTER — Ambulatory Visit: Attending: Medical | Admitting: Medical

## 2024-08-22 VITALS — BP 100/70 | HR 62 | Ht 72.0 in | Wt 202.2 lb

## 2024-08-22 DIAGNOSIS — E782 Mixed hyperlipidemia: Secondary | ICD-10-CM | POA: Diagnosis not present

## 2024-08-22 DIAGNOSIS — I10 Essential (primary) hypertension: Secondary | ICD-10-CM

## 2024-08-22 DIAGNOSIS — Z79899 Other long term (current) drug therapy: Secondary | ICD-10-CM | POA: Diagnosis not present

## 2024-08-22 DIAGNOSIS — I25110 Atherosclerotic heart disease of native coronary artery with unstable angina pectoris: Secondary | ICD-10-CM

## 2024-08-22 NOTE — Patient Instructions (Signed)
 Medication Instructions:  Your physician recommends that you continue on your current medications as directed. Please refer to the Current Medication list given to you today.    *If you need a refill on your cardiac medications before your next appointment, please call your pharmacy*  Lab Work: Your provider would like for you to have following labs drawn today CBC.     Testing/Procedures: No test ordered today   Follow-Up: At [facility], you and your health needs are our priority.  As part of our continuing mission to provide you with exceptional heart care, our providers are all part of one team.  This team includes your primary Cardiologist (physician) and Advanced Practice Providers or APPs (Physician Assistants and Nurse Practitioners) who all work together to provide you with the care you need, when you need it.  Your next appointment:   2 month(s)  Provider:   Timothy Gollan, MD or Cadence Franchester, PA-C

## 2024-08-22 NOTE — Progress Notes (Addendum)
 Cardiology Office Note   Date:  08/22/2024  ID:  James Arellano, DOB 03/22/1961, MRN [id number] PCP: James Coyer, MD  [facility] Providers Cardiologist:  James Lunger, MD   History of Present Illness James Arellano is a 63 y.o. male with a h/o depression/anxiety, HLD, family history of coronary atherosclerosis, coronary artery disease s/p DES x 2 ost-mid LAD, hypertension, suspected von Willebrand's disease who presents for hospital follow-up.  James Arellano was previously seen in in clinic 07/22/2020 as a new patient for chest pain.  Coronary calcium score was 1899, severe CAD with 70 to 99% mixed plaques in the proximal LAD, 25-49% mixed plaque in the proximal R1 segment, 1 to 24% plaque in the ostial proximal left circumflex segment, 50 to 69% calcified plaque proximal RCA.  CT FFR showed significant stenosis in the proximal LAD.  Patient was notified and cardiac catheterization was recommended.  However patient had vacation plans and cardiac medications were called in.  He was started on Toprol -XL 25 Mg daily, Imdur  30 Mg daily, Aspirin  81 Mg daily and Sublingual Nitroglycerin .  Lisinopril  was decreased to 20 Mg daily.   He was evaluated in clinic 08/08/24 reporting continued chest pain episodes improved with sublingual nitroglycerin .  Patient became hypotensive and was given IV fluids, aspirin  324 mg daily and was ultimately taken to the ER for further workup.  In the ER he denied any further chest pain.  Blood pressure 125/70, serum creatinine 1.32, hemoglobin 12.5.  CT of the chest negative for PE, 4.9 cm ascending aortic aneurysm noted.  Patient underwent cardiac cath and was treated with stenting to the ostial/mid LAD.  Stent across the LAD was placed due to proximal edge dissection after initial stent placement. Cardiac cath otherwise showed nonobstructive disease.  He was started on aspirin  and Effient for at least 6 months. Echo showed normal EF and normal  diastolic parameters.  Patient did have intermittent chest pain during hospitalization requiring nitro.  He was started on amlodipine , Imdur , aspirin , Effient and Praluent.  Today, the cardiac cath was reviewed. The medication Imdur  caused severe headache so he stopped it. He restarted metoprolol  25mg  daily. He denies bleeding issues. Says he likely has von Willebrand's disease.  He has occasionally has chest twinges. He denies SOB. He has been walking 1 mile a day.   Studies Reviewed EKG Interpretation Date/Time:  Monday August 22 2024 13:46:20 EDT Ventricular Rate:  62 PR Interval:  168 QRS Duration:  98 QT Interval:  412 QTC Calculation: 418 R Axis:   -31  Text Interpretation: Normal sinus rhythm Left axis deviation Incomplete right bundle branch block When compared with ECG of 10-Aug-2024 04:51, Incomplete right bundle branch block is now Present Confirmed by Franchester, James Arellano ([id number]) on 08/22/2024 1:59:50 PM    [facility]HC 08/2024 Conclusions: Severe single-vessel coronary artery disease with heterogeneous 80-90% proximal LAD stenosis (MLA 1.57 mm) and 50% mid LAD stenosis with component of myocardial bridging.  There is also mild-moderate, nonobstructive disease involving codominant LCx and RCA. Mildly elevated left ventricular filling pressure (LVEDP 18 mmHg). Successful OCT-guided PCI to ostial/mid LAD with overlapping Onyx Frontier 4.0 x 8 mm and 3.5 x 34 mm drug-eluting stents with 0% residual stenosis and TIMI-3 flow.  Stent across the ostium of the LAD was placed due to proximal edge dissection after initial stent placement. Tiny nonflow limiting dissection involving the mid LMCA apparent only on OCT and not angiography, likely guide catheter induced.   Recommendations: Overnight observation. Dual antiplatelet therapy  with aspirin  and prasugrel for at least 6 months.  Will send cytochrome P450 2C19 genotype; if the patient is an appropriate clopidogrel responder, would recommend  indefinite clopidogrel therapy after completion of initial 6 months of DAPT. Gently postcatheterization hydration and close monitoring of renal function given 290 mL of contrast used for catheterization/intervention. Aggressive secondary prevention of coronary artery disease.   James Hanson, MD [facility]    Echo 08/08/2024  1. Left ventricular ejection fraction, by estimation, is 55 to 60%. The  left ventricle has normal function. The left ventricle has no regional  wall motion abnormalities. Left ventricular diastolic parameters were  normal.   2. Right ventricular systolic function is normal. The right ventricular  size is normal. There is normal pulmonary artery systolic pressure.   3. The mitral valve is normal in structure. No evidence of mitral valve  regurgitation. No evidence of mitral stenosis.   4. The aortic valve is normal in structure. Aortic valve regurgitation is  mild. Aortic valve sclerosis/calcification is present, without any  evidence of aortic stenosis.   5. The inferior vena cava is dilated in size with >50% respiratory  variability, suggesting right atrial pressure of 8 mmHg.   Physical Exam VS:  BP 100/70 (BP Location: Left Arm, Patient Position: Sitting, Cuff Size: Normal)   Pulse 62 Comment: 65 oximeter  Ht 6' (1.829 m)   Wt 202 lb 3.2 oz (91.7 kg)   SpO2 98%   BMI 27.42 kg/m        Wt Readings from Last 3 Encounters:  08/22/24 202 lb 3.2 oz (91.7 kg)  08/11/24 199 lb 4.7 oz (90.4 kg)  08/08/24 198 lb 9.6 oz (90.1 kg)    GEN: Well nourished, well developed in no acute distress NECK: No JVD; No carotid bruits CARDIAC: RRR, no murmurs, rubs, gallops RESPIRATORY:  Clear to auscultation without rales, wheezing or rhonchi  ABDOMEN: Soft, non-tender, non-distended EXTREMITIES:  No edema; No deformity   ASSESSMENT AND PLAN  CAD s/p PCI/DES x 2 ost/mid LAD Recent admission for syncope, hypotension and CAD with UA. Cardiac cath showed severe  single vessel CAD pLAD and mid LAD stenosis with component of myocardial bridging, and otherwise nonobstructive disease involving codominant Lcx and RCA. Treated with successful OCT-guided PCI to ostial/mid LAD with overlapping stents. Patient was started on DAPT with ASA and Prasugrel for 6 months. Cytochrome P450 2 C19 genotype was sent in.  If patient is appropriate Plavix responder, recommend indefinite Plavix therapy after completion of initial 6 months of DAPT.  Patient is overall doing well since being in the hospital.  He has occasional chest twinges, no shortness of breath.  He is walking 1 mile a day.  Cath site is stable.  He was found to have von Willebrand's disease in the hospital.  I will refer him to cardiac rehab.  Continue DAPT with aspirin  and prasugrel x 6 months.  Cytochrome P4 52 C19 is pending.  Continue Lipitor 80 mg daily, Zetia 10 mg daily, Praluent.  He did not tolerate Imdur  due to severe headache.  He restarted metoprolol  25 mg daily, we will continue this.  Continue amlodipine  5 mg daily. CBC today.  HLD LDL 156. Continue Praluent and Zetia  HTN BP is soft today. Continue amlodipine  5mg  daily and metoprolol  25mg  daily. He stopped Imdur  due to headache as above.     Cardiac Rehabilitation Eligibility Assessment  The patient is ready to start cardiac rehabilitation from a cardiac standpoint.  Dispo: Follow-up in 2 months  Signed, Lee-Ann Gal VEAR Fishman, PA-C

## 2024-08-23 ENCOUNTER — Ambulatory Visit: Payer: Self-pay | Admitting: Medical

## 2024-08-23 LAB — CBC
Hematocrit: 39.8 % (ref 37.5–51.0)
Hemoglobin: 13.3 g/dL (ref 13.0–17.7)
MCH: 32.6 pg (ref 26.6–33.0)
MCHC: 33.4 g/dL (ref 31.5–35.7)
MCV: 98 fL — ABNORMAL HIGH (ref 79–97)
Platelets: 334 x10E3/uL (ref 150–450)
RBC: 4.08 x10E6/uL — ABNORMAL LOW (ref 4.14–5.80)
RDW: 12.7 % (ref 11.6–15.4)
WBC: 8.4 x10E3/uL (ref 3.4–10.8)

## 2024-08-23 LAB — CYTOCHROME P450 2C19: 2C19 Metabolic Activity:: NORMAL

## 2024-08-24 ENCOUNTER — Ambulatory Visit: Payer: Self-pay | Admitting: Internal Medicine

## 2024-08-25 ENCOUNTER — Encounter: Payer: Self-pay | Admitting: Emergency Medicine

## 2024-08-25 ENCOUNTER — Other Ambulatory Visit: Payer: Self-pay

## 2024-08-25 ENCOUNTER — Encounter: Attending: Internal Medicine | Admitting: Emergency Medicine

## 2024-08-25 DIAGNOSIS — Z955 Presence of coronary angioplasty implant and graft: Secondary | ICD-10-CM | POA: Insufficient documentation

## 2024-08-25 DIAGNOSIS — Z48812 Encounter for surgical aftercare following surgery on the circulatory system: Secondary | ICD-10-CM | POA: Insufficient documentation

## 2024-08-25 NOTE — Progress Notes (Signed)
 Initial phone call completed. Diagnosis can be found in [facility] 08/22/24. EP Orientation scheduled for 08/31/24 at 1:45pm.

## 2024-08-27 ENCOUNTER — Other Ambulatory Visit: Payer: Self-pay | Admitting: Adult Health

## 2024-08-27 DIAGNOSIS — G4700 Insomnia, unspecified: Secondary | ICD-10-CM

## 2024-08-27 DIAGNOSIS — F411 Generalized anxiety disorder: Secondary | ICD-10-CM

## 2024-08-31 ENCOUNTER — Encounter

## 2024-08-31 VITALS — Ht 72.1 in | Wt 199.3 lb

## 2024-08-31 DIAGNOSIS — Z48812 Encounter for surgical aftercare following surgery on the circulatory system: Secondary | ICD-10-CM | POA: Diagnosis present

## 2024-08-31 DIAGNOSIS — Z955 Presence of coronary angioplasty implant and graft: Secondary | ICD-10-CM | POA: Diagnosis present

## 2024-08-31 NOTE — Progress Notes (Signed)
 Cardiac Individual Treatment Plan  Patient Details  Name: James Arellano MRN: [id number] Date of Birth: 04/07/1961 Referring Provider:   Flowsheet Row Cardiac Rehab from 08/31/2024 in [facility] Cardiac and Pulmonary Rehab  Referring Provider Dr. Lonni End    Initial Encounter Date:  Flowsheet Row Cardiac Rehab from 08/31/2024 in [facility] Cardiac and Pulmonary Rehab  Date 08/31/24    Visit Diagnosis: Status post coronary artery stent placement  Patient's Home Medications on Admission:  Current Outpatient Medications:    ALPRAZolam  (XANAX ) 0.5 MG tablet, TAKE 1 TABLET BY MOUTH TWICE  DAILY, Disp: 180 tablet, Rfl: 0   amLODipine  (NORVASC ) 5 MG tablet, Take 1 tablet (5 mg total) by mouth daily., Disp: 90 tablet, Rfl: 3   aspirin  EC 81 MG tablet, Take 1 tablet (81 mg total) by mouth daily. Swallow whole., Disp: , Rfl:    atorvastatin (LIPITOR) 80 MG tablet, Take 1 tablet (80 mg total) by mouth daily., Disp: 30 tablet, Rfl: 1   buPROPion  (WELLBUTRIN  XL) 150 MG 24 hr tablet, Take 3 tablets (450 mg total) by mouth daily., Disp: 270 tablet, Rfl: 1   ezetimibe (ZETIA) 10 MG tablet, Take 1 tablet (10 mg total) by mouth daily., Disp: 30 tablet, Rfl: 1   nitroGLYCERIN  (NITROSTAT ) 0.4 MG SL tablet, Place 1 tablet (0.4 mg total) under the tongue every 5 (five) minutes as needed for chest pain., Disp: 25 tablet, Rfl: 3   omeprazole (PRILOSEC) 40 MG capsule, Take 40 mg by mouth 2 (two) times daily., Disp: , Rfl:    prasugrel (EFFIENT) 10 MG TABS tablet, Take 1 tablet (10 mg total) by mouth daily., Disp: 30 tablet, Rfl: 12   vortioxetine  HBr (TRINTELLIX ) 20 MG TABS tablet, Take 1 tablet (20 mg total) by mouth daily., Disp: 90 tablet, Rfl: 3  Current Facility-Administered Medications:    diphenoxylate -atropine  (LOMOTIL ) 2.5-0.025 MG per tablet 1 tablet, 1 tablet, Oral, QID PRN, Bertrum Charlie CROME, MD   promethazine  (PHENERGAN ) tablet 25 mg, 25 mg, Oral, Q6H PRN, Bertrum Charlie CROME, MD  Past Medical  History: Past Medical History:  Diagnosis Date   CAD (coronary artery disease) 08/09/2024   Depression    GERD (gastroesophageal reflux disease)    Hypertension     Tobacco Use: Social History   Tobacco Use  Smoking Status Never  Smokeless Tobacco Never    Labs: Review Flowsheet  More data exists      Latest Ref Rng & Units 12/17/2018 02/23/2020 09/25/2021 08/08/2024 08/09/2024  Labs for ITP Cardiac and Pulmonary Rehab  Cholestrol 0 - 200 mg/dL 791  794  757  - 782   LDL (calc) 0 - 99 mg/dL 855  858  824  - 843   HDL-C >40 mg/dL 52  51  54  - 51   Trlycerides <150 mg/dL 61  74  76  - 49   Hemoglobin A1c 4.8 - 5.6 % - - - 4.8  -     Exercise Target Goals: Exercise Program Goal: Individual exercise prescription set using results from initial 6 min walk test and THRR while considering  patient's activity barriers and safety.   Exercise Prescription Goal: Initial exercise prescription builds to 30-45 minutes a day of aerobic activity, 2-3 days per week.  Home exercise guidelines will be given to patient during program as part of exercise prescription that the participant will acknowledge.   Education: Aerobic Exercise: - Group verbal and visual presentation on the components of exercise prescription. Introduces F.I.T.T principle from ACSM  for exercise prescriptions.  Reviews F.I.T.T. principles of aerobic exercise including progression. Written material provided at class time. Flowsheet Row Cardiac Rehab from 08/31/2024 in [facility] Cardiac and Pulmonary Rehab  Education need identified 08/31/24    Education: Resistance Exercise: - Group verbal and visual presentation on the components of exercise prescription. Introduces F.I.T.T principle from ACSM for exercise prescriptions  Reviews F.I.T.T. principles of resistance exercise including progression. Written material provided at class time.    Education: Exercise & Equipment Safety: - Individual verbal instruction and  demonstration of equipment use and safety with use of the equipment. Flowsheet Row Cardiac Rehab from 08/31/2024 in [facility] Cardiac and Pulmonary Rehab  Date 08/31/24  Educator [facility]  Instruction Review Code 1- Verbalizes Understanding    Education: Exercise Physiology & General Exercise Guidelines: - Group verbal and written instruction with models to review the exercise physiology of the cardiovascular system and associated critical values. Provides general exercise guidelines with specific guidelines to those with heart or lung disease. Written material provided at class time. Flowsheet Row Cardiac Rehab from 08/31/2024 in [facility] Cardiac and Pulmonary Rehab  Education need identified 08/31/24    Education: Flexibility, Balance, Mind/Body Relaxation: - Group verbal and visual presentation with interactive activity on the components of exercise prescription. Introduces F.I.T.T principle from ACSM for exercise prescriptions. Reviews F.I.T.T. principles of flexibility and balance exercise training including progression. Also discusses the mind body connection.  Reviews various relaxation techniques to help reduce and manage stress (i.e. Deep breathing, progressive muscle relaxation, and visualization). Balance handout provided to take home. Written material provided at class time.   Activity Barriers & Risk Stratification:  Activity Barriers & Cardiac Risk Stratification - 08/31/24 1516       Activity Barriers & Cardiac Risk Stratification   Activity Barriers Other (comment)    Comments ruptured right bicep tendon; right rotator cuff tear    Cardiac Risk Stratification Moderate          6 Minute Walk:  6 Minute Walk     Row Name 08/31/24 1515         6 Minute Walk   Phase Initial     Distance 1350 feet     Walk Time 6 minutes     # of Rest Breaks 0     MPH 2.56     METS 3.5     RPE 10     Perceived Dyspnea  0     VO2 Peak 12.3     Symptoms No     Resting HR 74 bpm     Resting  BP 116/72     Resting Oxygen Saturation  99 %     Exercise Oxygen Saturation  during 6 min walk 99 %     Max Ex. HR 105 bpm     Max Ex. BP 122/82     2 Minute Post BP 114/78        Oxygen Initial Assessment:   Oxygen Re-Evaluation:   Oxygen Discharge (Final Oxygen Re-Evaluation):   Initial Exercise Prescription:  Initial Exercise Prescription - 08/31/24 1500       Date of Initial Exercise RX and Referring Provider   Date 08/31/24    Referring Provider Dr. Lonni End      Oxygen   Maintain Oxygen Saturation 88% or higher      Treadmill   MPH 2.6    Grade 1    Minutes 15    METs 3.35      Elliptical  Level 1    Speed 3    Minutes 15    METs 3.5      Rower   Level 10    Watts 50    Minutes 15    METs 3.5      Prescription Details   Duration Progress to 30 minutes of continuous aerobic without signs/symptoms of physical distress      Intensity   THRR 40-80% of Max Heartrate 107-141    Ratings of Perceived Exertion 11-13    Perceived Dyspnea 0-4      Progression   Progression Continue to progress workloads to maintain intensity without signs/symptoms of physical distress.      Resistance Training   Training Prescription Yes    Weight 10lb    Reps 10-15          Perform Capillary Blood Glucose checks as needed.  Exercise Prescription Changes:   Exercise Prescription Changes     Row Name 08/31/24 1500             Response to Exercise   Blood Pressure (Admit) 116/72       Blood Pressure (Exercise) 122/82       Blood Pressure (Exit) 114/78       Heart Rate (Admit) 74 bpm       Heart Rate (Exercise) 105 bpm       Heart Rate (Exit) 80 bpm       Oxygen Saturation (Admit) 99 %       Oxygen Saturation (Exercise) 99 %       Oxygen Saturation (Exit) 99 %       Rating of Perceived Exertion (Exercise) 10       Perceived Dyspnea (Exercise) 0       Symptoms none       Comments results          Exercise Comments:   Exercise  Goals and Review:   Exercise Goals     Row Name 08/31/24 1518             Exercise Goals   Increase Physical Activity Yes       Intervention Develop an individualized exercise prescription for aerobic and resistive training based on initial evaluation findings, risk stratification, comorbidities and participant's personal goals.;Provide advice, education, support and counseling about physical activity/exercise needs.       Expected Outcomes Short Term: Attend rehab on a regular basis to increase amount of physical activity.;Long Term: Add in home exercise to make exercise part of routine and to increase amount of physical activity.;Long Term: Exercising regularly at least 3-5 days a week.       Increase Strength and Stamina Yes       Intervention Provide advice, education, support and counseling about physical activity/exercise needs.;Develop an individualized exercise prescription for aerobic and resistive training based on initial evaluation findings, risk stratification, comorbidities and participant's personal goals.       Expected Outcomes Short Term: Increase workloads from initial exercise prescription for resistance, speed, and METs.;Long Term: Improve cardiorespiratory fitness, muscular endurance and strength as measured by increased METs and functional capacity ( );Short Term: Perform resistance training exercises routinely during rehab and add in resistance training at home       Able to understand and use rate of perceived exertion (RPE) scale Yes       Intervention Provide education and explanation on how to use RPE scale       Expected Outcomes Short Term: Able to  use RPE daily in rehab to express subjective intensity level;Long Term:  Able to use RPE to guide intensity level when exercising independently       Able to understand and use Dyspnea scale Yes       Intervention Provide education and explanation on how to use Dyspnea scale       Expected Outcomes Long Term: Able to  use Dyspnea scale to guide intensity level when exercising independently;Short Term: Able to use Dyspnea scale daily in rehab to express subjective sense of shortness of breath during exertion       Knowledge and understanding of Target Heart Rate Range (THRR) Yes       Intervention Provide education and explanation of THRR including how the numbers were predicted and where they are located for reference       Expected Outcomes Short Term: Able to state/look up THRR;Short Term: Able to use daily as guideline for intensity in rehab;Long Term: Able to use THRR to govern intensity when exercising independently       Able to check pulse independently Yes       Intervention Provide education and demonstration on how to check pulse in carotid and radial arteries.;Review the importance of being able to check your own pulse for safety during independent exercise       Expected Outcomes Short Term: Able to explain why pulse checking is important during independent exercise;Long Term: Able to check pulse independently and accurately       Understanding of Exercise Prescription Yes       Intervention Provide education, explanation, and written materials on patient's individual exercise prescription       Expected Outcomes Short Term: Able to explain program exercise prescription;Long Term: Able to explain home exercise prescription to exercise independently          Exercise Goals Re-Evaluation :   Discharge Exercise Prescription (Final Exercise Prescription Changes):  Exercise Prescription Changes - 08/31/24 1500       Response to Exercise   Blood Pressure (Admit) 116/72    Blood Pressure (Exercise) 122/82    Blood Pressure (Exit) 114/78    Heart Rate (Admit) 74 bpm    Heart Rate (Exercise) 105 bpm    Heart Rate (Exit) 80 bpm    Oxygen Saturation (Admit) 99 %    Oxygen Saturation (Exercise) 99 %    Oxygen Saturation (Exit) 99 %    Rating of Perceived Exertion (Exercise) 10    Perceived Dyspnea  (Exercise) 0    Symptoms none    Comments results          Nutrition:  Target Goals: Understanding of nutrition guidelines, daily intake of sodium 1500mg , cholesterol 200mg , calories 30% from fat and 7% or less from saturated fats, daily to have 5 or more servings of fruits and vegetables.  Education: Nutrition 1 -Group instruction provided by verbal, written material, interactive activities, discussions, models, and posters to present general guidelines for heart healthy nutrition including macronutrients, label reading, and promoting whole foods over processed counterparts. Education serves as pensions consultant of discussion of heart healthy eating for all. Written material provided at class time.    Education: Nutrition 2 -Group instruction provided by verbal, written material, interactive activities, discussions, models, and posters to present general guidelines for heart healthy nutrition including sodium, cholesterol, and saturated fat. Providing guidance of habit forming to improve blood pressure, cholesterol, and body weight. Written material provided at class time.     Biometrics:  Pre  Biometrics - 08/31/24 1518       Pre Biometrics   Height 6' 0.1 (1.831 m)    Weight 199 lb 4.8 oz (90.4 kg)    Waist Circumference 36 inches    Hip Circumference 40.5 inches    Waist to Hip Ratio 0.89 %    BMI (Calculated) 26.97    Single Leg Stand 11.3 seconds           Nutrition Therapy Plan and Nutrition Goals:   Nutrition Assessments:  MEDIFICTS Score Key: >=70 Need to make dietary changes  40-70 Heart Healthy Diet <= 40 Therapeutic Level Cholesterol Diet  Flowsheet Row Cardiac Rehab from 08/31/2024 in [facility] Cardiac and Pulmonary Rehab  Picture Your Plate Total Score on Admission 64   Picture Your Plate Scores: <59 Unhealthy dietary pattern with much room for improvement. 41-50 Dietary pattern unlikely to meet recommendations for good health and room for  improvement. 51-60 More healthful dietary pattern, with some room for improvement.  >60 Healthy dietary pattern, although there may be some specific behaviors that could be improved.    Nutrition Goals Re-Evaluation:   Nutrition Goals Discharge (Final Nutrition Goals Re-Evaluation):   Psychosocial: Target Goals: Acknowledge presence or absence of significant depression and/or stress, maximize coping skills, provide positive support system. Participant is able to verbalize types and ability to use techniques and skills needed for reducing stress and depression.   Education: Stress, Anxiety, and Depression - Group verbal and visual presentation to define topics covered.  Reviews how body is impacted by stress, anxiety, and depression.  Also discusses healthy ways to reduce stress and to treat/manage anxiety and depression. Written material provided at class time.   Education: Sleep Hygiene -Provides group verbal and written instruction about how sleep can affect your health.  Define sleep hygiene, discuss sleep cycles and impact of sleep habits. Review good sleep hygiene tips.   Initial Review & Psychosocial Screening:  Initial Psych Review & Screening - 08/25/24 1452       Initial Review   Current issues with Current Depression      Family Dynamics   Good Support System? Yes   wife     Barriers   Psychosocial barriers to participate in program There are no identifiable barriers or psychosocial needs.;The patient should benefit from training in stress management and relaxation.      Screening Interventions   Interventions Encouraged to exercise;Provide feedback about the scores to participant;To provide support and resources with identified psychosocial needs    Expected Outcomes Short Term goal: Utilizing psychosocial counselor, staff and physician to assist with identification of specific Stressors or current issues interfering with healing process. Setting desired goal for each  stressor or current issue identified.;Long Term Goal: Stressors or current issues are controlled or eliminated.;Short Term goal: Identification and review with participant of any Quality of Life or Depression concerns found by scoring the questionnaire.;Long Term goal: The participant improves quality of Life and PHQ9 Scores as seen by post scores and/or verbalization of changes          Quality of Life Scores:   Quality of Life - 08/31/24 1519       Quality of Life   Select Quality of Life      Quality of Life Scores   Health/Function Pre 22.97 %    Socioeconomic Pre 19.19 %    Psych/Spiritual Pre 9 %    Family Pre 15.6 %    GLOBAL Pre 18.26 %  Scores of 19 and below usually indicate a poorer quality of life in these areas.  A difference of  2-3 points is a clinically meaningful difference.  A difference of 2-3 points in the total score of the Quality of Life Index has been associated with significant improvement in overall quality of life, self-image, physical symptoms, and general health in studies assessing change in quality of life.  PHQ-9: Review Flowsheet  More data exists      07/20/2024 01/18/2024 08/19/2023 01/01/2022 02/15/2020  Depression screen PHQ 2/9  Decreased Interest 0 0 0 0 0  Down, Depressed, Hopeless 0 0 0 0 0  PHQ - 2 Score 0 0 0 0 0  Altered sleeping 0 - - 0 0  Tired, decreased energy 0 - - 0 0  Change in appetite 0 - - 0 0  Feeling bad or failure about yourself  0 - - 0 0  Trouble concentrating 0 - - 0 0  Moving slowly or fidgety/restless 0 - - 0 0  Suicidal thoughts 0 - - 0 0  PHQ-9 Score 0 - - 0 0  Difficult doing work/chores - - - Not difficult at all Not difficult at all   Interpretation of Total Score  Total Score Depression Severity:  1-4 = Minimal depression, 5-9 = Mild depression, 10-14 = Moderate depression, 15-19 = Moderately severe depression, 20-27 = Severe depression   Psychosocial Evaluation and Intervention:  Psychosocial  Evaluation - 08/25/24 1453       Psychosocial Evaluation & Interventions   Interventions Stress management education;Relaxation education;Encouraged to exercise with the program and follow exercise prescription    Comments Marcey is looking forward to starting cardiac rehab to see how it will improve his daily life. He currently lives with his wife and states she is a great support for him. He reports that they both work full time, however they also enjoy taking on big home projects and that his is therapeutic for him. Marcey did report he has a ruptured bicep tendon and torn rotator cuff in his right arm, however states that this has minimal impact on his functional ability. He does admit to dealing with PTSD and depression, however he says these are well managed at this time.    Expected Outcomes Short: Attend cardiac rehab for education and exercise.Long: Develop and maintain positive self care habits.    Continue Psychosocial Services  Follow up required by staff          Psychosocial Re-Evaluation:   Psychosocial Discharge (Final Psychosocial Re-Evaluation):   Vocational Rehabilitation: Provide vocational rehab assistance to qualifying candidates.   Vocational Rehab Evaluation & Intervention:  Vocational Rehab - 08/25/24 1451       Initial Vocational Rehab Evaluation & Intervention   Assessment shows need for Vocational Rehabilitation No          Education: Education Goals: Education classes will be provided on a variety of topics geared toward better understanding of heart health and risk factor modification. Participant will state understanding/return demonstration of topics presented as noted by education test scores.  Learning Barriers/Preferences:  Learning Barriers/Preferences - 08/25/24 1451       Learning Barriers/Preferences   Learning Barriers None    Learning Preferences None          General Cardiac Education Topics:  AED/CPR: - Group verbal and  written instruction with the use of models to demonstrate the basic use of the AED with the basic ABC's of resuscitation.   Test and  Procedures: - Group verbal and visual presentation and models provide information about basic cardiac anatomy and function. Reviews the testing methods done to diagnose heart disease and the outcomes of the test results. Describes the treatment choices: Medical Management, Angioplasty, or Coronary Bypass Surgery for treating various heart conditions including Myocardial Infarction, Angina, Valve Disease, and Cardiac Arrhythmias. Written material provided at class time.   Medication Safety: - Group verbal and visual instruction to review commonly prescribed medications for heart and lung disease. Reviews the medication, class of the drug, and side effects. Includes the steps to properly store meds and maintain the prescription regimen. Written material provided at class time.   Intimacy: - Group verbal instruction through game format to discuss how heart and lung disease can affect sexual intimacy. Written material provided at class time.   Know Your Numbers and Heart Failure: - Group verbal and visual instruction to discuss disease risk factors for cardiac and pulmonary disease and treatment options.  Reviews associated critical values for Overweight/Obesity, Hypertension, Cholesterol, and Diabetes.  Discusses basics of heart failure: signs/symptoms and treatments.  Introduces Heart Failure Zone chart for action plan for heart failure. Written material provided at class time.   Infection Prevention: - Provides verbal and written material to individual with discussion of infection control including proper hand washing and proper equipment cleaning during exercise session. Flowsheet Row Cardiac Rehab from 08/31/2024 in [facility] Cardiac and Pulmonary Rehab  Date 08/31/24  Educator [facility]  Instruction Review Code 1- Verbalizes Understanding    Falls Prevention: -  Provides verbal and written material to individual with discussion of falls prevention and safety. Flowsheet Row Cardiac Rehab from 08/31/2024 in [facility] Cardiac and Pulmonary Rehab  Date 08/31/24  Educator [facility]  Instruction Review Code 1- Verbalizes Understanding    Other: -Provides group and verbal instruction on various topics (see comments)   Knowledge Questionnaire Score:  Knowledge Questionnaire Score - 08/31/24 1521       Knowledge Questionnaire Score   Pre Score 24/26          Core Components/Risk Factors/Patient Goals at Admission:  Personal Goals and Risk Factors at Admission - 08/25/24 1450       Core Components/Risk Factors/Patient Goals on Admission    Weight Management Weight Maintenance;Yes    Intervention Weight Management: Develop a combined nutrition and exercise program designed to reach desired caloric intake, while maintaining appropriate intake of nutrient and fiber, sodium and fats, and appropriate energy expenditure required for the weight goal.;Weight Management: Provide education and appropriate resources to help participant work on and attain dietary goals.    Expected Outcomes Weight Maintenance: Understanding of the daily nutrition guidelines, which includes 25-35% calories from fat, 7% or less cal from saturated fats, less than 200mg  cholesterol, less than 1.5gm of sodium, & 5 or more servings of fruits and vegetables daily;Short Term: Continue to assess and modify interventions until short term weight is achieved;Long Term: Adherence to nutrition and physical activity/exercise program aimed toward attainment of established weight goal;Understanding recommendations for meals to include 15-35% energy as protein, 25-35% energy from fat, 35-60% energy from carbohydrates, less than 200mg  of dietary cholesterol, 20-35 gm of total fiber daily;Understanding of distribution of calorie intake throughout the day with the consumption of 4-5 meals/snacks    Hypertension Yes     Intervention Provide education on lifestyle modifcations including regular physical activity/exercise, weight management, moderate sodium restriction and increased consumption of fresh fruit, vegetables, and low fat dairy, alcohol moderation, and smoking cessation.;Monitor prescription use  compliance.    Expected Outcomes Short Term: Continued assessment and intervention until BP is < 140/85mm HG in hypertensive participants. < 130/6mm HG in hypertensive participants with diabetes, heart failure or chronic kidney disease.;Long Term: Maintenance of blood pressure at goal levels.    Lipids Yes    Intervention Provide education and support for participant on nutrition & aerobic/resistive exercise along with prescribed medications to achieve LDL 70mg , HDL >40mg .    Expected Outcomes Short Term: Participant states understanding of desired cholesterol values and is compliant with medications prescribed. Participant is following exercise prescription and nutrition guidelines.;Long Term: Cholesterol controlled with medications as prescribed, with individualized exercise RX and with personalized nutrition plan. Value goals: LDL < 70mg , HDL > 40 mg.          Education:Diabetes - Individual verbal and written instruction to review signs/symptoms of diabetes, desired ranges of glucose level fasting, after meals and with exercise. Acknowledge that pre and post exercise glucose checks will be done for 3 sessions at entry of program.   Core Components/Risk Factors/Patient Goals Review:    Core Components/Risk Factors/Patient Goals at Discharge (Final Review):    ITP Comments:  ITP Comments     Row Name 08/25/24 1444 08/31/24 1514         ITP Comments Initial phone call completed. Diagnosis can be found in [facility] 08/22/24. EP Orientation scheduled for 08/31/24 at 1:45pm. Completed and gym orientation for cardiac rehab. Initial ITP created and sent for review to Dr. Oneil Pinal, Medical Director.          Comments: Initial ITP

## 2024-08-31 NOTE — Patient Instructions (Signed)
 Patient Instructions  Patient Details  Name: James Arellano MRN: [id number] Date of Birth: 1960-12-10 Referring Provider:  Mady Bruckner, MD  Below are your personal goals for exercise, nutrition, and risk factors. Our goal is to help you stay on track towards obtaining and maintaining these goals. We will be discussing your progress on these goals with you throughout the program.  Initial Exercise Prescription:  Initial Exercise Prescription - 08/31/24 1500       Date of Initial Exercise RX and Referring Provider   Date 08/31/24    Referring Provider Dr. Bruckner End      Oxygen   Maintain Oxygen Saturation 88% or higher      Treadmill   MPH 2.6    Grade 1    Minutes 15    METs 3.35      Elliptical   Level 1    Speed 3    Minutes 15    METs 3.5      Rower   Level 10    Watts 50    Minutes 15    METs 3.5      Prescription Details   Duration Progress to 30 minutes of continuous aerobic without signs/symptoms of physical distress      Intensity   THRR 40-80% of Max Heartrate 107-141    Ratings of Perceived Exertion 11-13    Perceived Dyspnea 0-4      Progression   Progression Continue to progress workloads to maintain intensity without signs/symptoms of physical distress.      Resistance Training   Training Prescription Yes    Weight 10lb    Reps 10-15          Exercise Goals: Frequency: Be able to perform aerobic exercise two to three times per week in program working toward 2-5 days per week of home exercise.  Intensity: Work with a perceived exertion of 11 (fairly light) - 15 (hard) while following your exercise prescription.  We will make changes to your prescription with you as you progress through the program.   Duration: Be able to do 30 to 45 minutes of continuous aerobic exercise in addition to a 5 minute warm-up and a 5 minute cool-down routine.   Nutrition Goals: Your personal nutrition goals will be established when you do your  nutrition analysis with the dietician.  The following are general nutrition guidelines to follow: Cholesterol < 200mg /day Sodium < 1500mg /day Fiber: Men over 50 yrs - 30 grams per day  Personal Goals:  Personal Goals and Risk Factors at Admission - 08/25/24 1450       Core Components/Risk Factors/Patient Goals on Admission    Weight Management Weight Maintenance;Yes    Intervention Weight Management: Develop a combined nutrition and exercise program designed to reach desired caloric intake, while maintaining appropriate intake of nutrient and fiber, sodium and fats, and appropriate energy expenditure required for the weight goal.;Weight Management: Provide education and appropriate resources to help participant work on and attain dietary goals.    Expected Outcomes Weight Maintenance: Understanding of the daily nutrition guidelines, which includes 25-35% calories from fat, 7% or less cal from saturated fats, less than 200mg  cholesterol, less than 1.5gm of sodium, & 5 or more servings of fruits and vegetables daily;Short Term: Continue to assess and modify interventions until short term weight is achieved;Long Term: Adherence to nutrition and physical activity/exercise program aimed toward attainment of established weight goal;Understanding recommendations for meals to include 15-35% energy as protein, 25-35% energy from fat, 35-60%  energy from carbohydrates, less than 200mg  of dietary cholesterol, 20-35 gm of total fiber daily;Understanding of distribution of calorie intake throughout the day with the consumption of 4-5 meals/snacks    Hypertension Yes    Intervention Provide education on lifestyle modifcations including regular physical activity/exercise, weight management, moderate sodium restriction and increased consumption of fresh fruit, vegetables, and low fat dairy, alcohol moderation, and smoking cessation.;Monitor prescription use compliance.    Expected Outcomes Short Term: Continued  assessment and intervention until BP is < 140/41mm HG in hypertensive participants. < 130/26mm HG in hypertensive participants with diabetes, heart failure or chronic kidney disease.;Long Term: Maintenance of blood pressure at goal levels.    Lipids Yes    Intervention Provide education and support for participant on nutrition & aerobic/resistive exercise along with prescribed medications to achieve LDL 70mg , HDL >40mg .    Expected Outcomes Short Term: Participant states understanding of desired cholesterol values and is compliant with medications prescribed. Participant is following exercise prescription and nutrition guidelines.;Long Term: Cholesterol controlled with medications as prescribed, with individualized exercise RX and with personalized nutrition plan. Value goals: LDL < 70mg , HDL > 40 mg.          Tobacco Use Initial Evaluation: Social History   Tobacco Use  Smoking Status Never  Smokeless Tobacco Never    Exercise Goals and Review:  Exercise Goals     Row Name 08/31/24 1518             Exercise Goals   Increase Physical Activity Yes       Intervention Develop an individualized exercise prescription for aerobic and resistive training based on initial evaluation findings, risk stratification, comorbidities and participant's personal goals.;Provide advice, education, support and counseling about physical activity/exercise needs.       Expected Outcomes Short Term: Attend rehab on a regular basis to increase amount of physical activity.;Long Term: Add in home exercise to make exercise part of routine and to increase amount of physical activity.;Long Term: Exercising regularly at least 3-5 days a week.       Increase Strength and Stamina Yes       Intervention Provide advice, education, support and counseling about physical activity/exercise needs.;Develop an individualized exercise prescription for aerobic and resistive training based on initial evaluation findings, risk  stratification, comorbidities and participant's personal goals.       Expected Outcomes Short Term: Increase workloads from initial exercise prescription for resistance, speed, and METs.;Long Term: Improve cardiorespiratory fitness, muscular endurance and strength as measured by increased METs and functional capacity ( );Short Term: Perform resistance training exercises routinely during rehab and add in resistance training at home       Able to understand and use rate of perceived exertion (RPE) scale Yes       Intervention Provide education and explanation on how to use RPE scale       Expected Outcomes Short Term: Able to use RPE daily in rehab to express subjective intensity level;Long Term:  Able to use RPE to guide intensity level when exercising independently       Able to understand and use Dyspnea scale Yes       Intervention Provide education and explanation on how to use Dyspnea scale       Expected Outcomes Long Term: Able to use Dyspnea scale to guide intensity level when exercising independently;Short Term: Able to use Dyspnea scale daily in rehab to express subjective sense of shortness of breath during exertion  Knowledge and understanding of Target Heart Rate Range (THRR) Yes       Intervention Provide education and explanation of THRR including how the numbers were predicted and where they are located for reference       Expected Outcomes Short Term: Able to state/look up THRR;Short Term: Able to use daily as guideline for intensity in rehab;Long Term: Able to use THRR to govern intensity when exercising independently       Able to check pulse independently Yes       Intervention Provide education and demonstration on how to check pulse in carotid and radial arteries.;Review the importance of being able to check your own pulse for safety during independent exercise       Expected Outcomes Short Term: Able to explain why pulse checking is important during independent  exercise;Long Term: Able to check pulse independently and accurately       Understanding of Exercise Prescription Yes       Intervention Provide education, explanation, and written materials on patient's individual exercise prescription       Expected Outcomes Short Term: Able to explain program exercise prescription;Long Term: Able to explain home exercise prescription to exercise independently          Copy of goals given to participant.

## 2024-09-01 ENCOUNTER — Other Ambulatory Visit: Payer: Self-pay

## 2024-09-01 ENCOUNTER — Encounter: Payer: Self-pay | Admitting: Cardiovascular Disease

## 2024-09-01 MED ORDER — OMEPRAZOLE 40 MG PO CPDR: 40.0000 mg | DELAYED_RELEASE_CAPSULE | Freq: Two times a day (BID) | ORAL | 3 refills | Status: DC

## 2024-09-01 MED ORDER — ATORVASTATIN CALCIUM 80 MG PO TABS: 80.0000 mg | ORAL_TABLET | Freq: Every day | ORAL | 3 refills | Status: AC

## 2024-09-01 MED ORDER — PRASUGREL HCL 10 MG PO TABS: 10.0000 mg | ORAL_TABLET | Freq: Every day | ORAL | 3 refills | Status: AC

## 2024-09-01 MED ORDER — EZETIMIBE 10 MG PO TABS: 10.0000 mg | ORAL_TABLET | Freq: Every day | ORAL | 3 refills | Status: AC

## 2024-09-05 ENCOUNTER — Encounter: Attending: Internal Medicine | Admitting: Emergency Medicine

## 2024-09-05 DIAGNOSIS — Z955 Presence of coronary angioplasty implant and graft: Secondary | ICD-10-CM | POA: Insufficient documentation

## 2024-09-05 NOTE — Progress Notes (Signed)
 Daily Session Note  Patient Details  Name: James Arellano MRN: [id number] Date of Birth: 05-26-1961 Referring Provider:   Flowsheet Row Cardiac Rehab from 08/31/2024 in [facility] Cardiac and Pulmonary Rehab  Referring Provider Dr. Lonni End    Encounter Date: 09/05/2024  Check In:  Session Check In - 09/05/24 1537       Check-In   Supervising physician immediately available to respond to emergencies See telemetry face sheet for immediately available ER MD    Location [facility]-Cardiac & Pulmonary Rehab    Staff Present Rollene Paterson, MS, Exercise Physiologist;Maxon Conetta BS, Exercise Physiologist;Joseph Rolinda RCP,RRT,BSRT;Kobie Matkins RN,BSN    Virtual Visit No    Medication changes reported     No    Fall or balance concerns reported    No    Tobacco Cessation No Change    Warm-up and Cool-down Performed on first and last piece of equipment    Resistance Training Performed Yes    VAD Patient? No    PAD/SET Patient? No      Pain Assessment   Currently in Pain? No/denies             Social History   Tobacco Use  Smoking Status Never  Smokeless Tobacco Never    Goals Met:  Independence with exercise equipment Exercise tolerated well No report of concerns or symptoms today Strength training completed today  Goals Unmet:  Not Applicable  Comments: First full day of exercise!  Patient was oriented to gym and equipment including functions, settings, policies, and procedures.  Patient's individual exercise prescription and treatment plan were reviewed.  All starting workloads were established based on the results of the 6 minute walk test done at initial orientation visit.  The plan for exercise progression was also introduced and progression will be customized based on patient's performance and goals.    Dr. Oneil Pinal is Medical Director for [facility] Cardiac Rehabilitation.  Dr. Fuad Aleskerov is Medical Director for [facility] Pulmonary Rehabilitation.

## 2024-09-07 ENCOUNTER — Encounter: Admitting: Emergency Medicine

## 2024-09-07 DIAGNOSIS — Z955 Presence of coronary angioplasty implant and graft: Secondary | ICD-10-CM | POA: Diagnosis not present

## 2024-09-07 NOTE — Progress Notes (Signed)
 Daily Session Note  Patient Details  Name: James Arellano MRN: [id number] Date of Birth: 10-Apr-1961 Referring Provider:   Flowsheet Row Cardiac Rehab from 08/31/2024 in [facility] Cardiac and Pulmonary Rehab  Referring Provider Dr. Lonni End    Encounter Date: 09/07/2024  Check In:  Session Check In - 09/07/24 1537       Check-In   Supervising physician immediately available to respond to emergencies See telemetry face sheet for immediately available ER MD    Location [facility]-Cardiac & Pulmonary Rehab    Staff Present Leita Franks RN,BSN;Joseph [facility] Dyane BS, ACSM CEP, Exercise Physiologist;Jason Elnor RDN,LDN    Virtual Visit No    Medication changes reported     No    Fall or balance concerns reported    No    Tobacco Cessation No Change    Warm-up and Cool-down Performed on first and last piece of equipment    Resistance Training Performed Yes    VAD Patient? No    PAD/SET Patient? No      Pain Assessment   Currently in Pain? No/denies             Social History   Tobacco Use  Smoking Status Never  Smokeless Tobacco Never    Goals Met:  Independence with exercise equipment Exercise tolerated well No report of concerns or symptoms today Strength training completed today  Goals Unmet:  Not Applicable  Comments: Pt able to follow exercise prescription today without complaint.  Will continue to monitor for progression.    Dr. Oneil Pinal is Medical Director for [facility] Cardiac Rehabilitation.  Dr. Fuad Aleskerov is Medical Director for [facility] Pulmonary Rehabilitation.

## 2024-09-08 ENCOUNTER — Encounter: Admitting: Emergency Medicine

## 2024-09-08 DIAGNOSIS — Z955 Presence of coronary angioplasty implant and graft: Secondary | ICD-10-CM

## 2024-09-08 NOTE — Progress Notes (Signed)
 Daily Session Note  Patient Details  Name: James Arellano MRN: [id number] Date of Birth: 10-Sep-1961 Referring Provider:   Flowsheet Row Cardiac Rehab from 08/31/2024 in [facility] Cardiac and Pulmonary Rehab  Referring Provider Dr. Lonni End    Encounter Date: 09/08/2024  Check In:  Session Check In - 09/08/24 1523       Check-In   Supervising physician immediately available to respond to emergencies See telemetry face sheet for immediately available ER MD    Location [facility]-Cardiac & Pulmonary Rehab    Staff Present Leita Franks RN,BSN;Joseph Rolinda [place] HARMAN Verlie Laird, [place], Exercise Physiologist;Margaret Best, MS, Exercise Physiologist    Virtual Visit No    Medication changes reported     No    Fall or balance concerns reported    No    Tobacco Cessation No Change    Warm-up and Cool-down Performed on first and last piece of equipment    Resistance Training Performed Yes    VAD Patient? No    PAD/SET Patient? No      Pain Assessment   Currently in Pain? No/denies             Social History   Tobacco Use  Smoking Status Never  Smokeless Tobacco Never    Goals Met:  Independence with exercise equipment Exercise tolerated well No report of concerns or symptoms today Strength training completed today  Goals Unmet:  Not Applicable  Comments: Pt able to follow exercise prescription today without complaint.  Will continue to monitor for progression.    Dr. Oneil Pinal is Medical Director for [facility] Cardiac Rehabilitation.  Dr. Fuad Aleskerov is Medical Director for [facility] Pulmonary Rehabilitation.

## 2024-09-12 ENCOUNTER — Encounter: Admitting: Emergency Medicine

## 2024-09-12 DIAGNOSIS — Z955 Presence of coronary angioplasty implant and graft: Secondary | ICD-10-CM | POA: Diagnosis not present

## 2024-09-12 NOTE — Progress Notes (Signed)
 Daily Session Note  Patient Details  Name: James Arellano MRN: [id number] Date of Birth: April 22, 1961 Referring Provider:   Flowsheet Row Cardiac Rehab from 08/31/2024 in [facility] Cardiac and Pulmonary Rehab  Referring Provider Dr. Lonni End    Encounter Date: 09/12/2024  Check In:  Session Check In - 09/12/24 1536       Check-In   Supervising physician immediately available to respond to emergencies See telemetry face sheet for immediately available ER MD    Location [facility]-Cardiac & Pulmonary Rehab    Staff Present Leita Franks RN,BSN;Maxon Conetta BS, Exercise Physiologist;Joseph Rolinda RCP,RRT,BSRT;Margaret Best, MS, Exercise Physiologist    Virtual Visit No    Medication changes reported     No    Fall or balance concerns reported    No    Tobacco Cessation No Change    Warm-up and Cool-down Performed on first and last piece of equipment    Resistance Training Performed Yes    VAD Patient? No    PAD/SET Patient? No      Pain Assessment   Currently in Pain? No/denies             Social History   Tobacco Use  Smoking Status Never  Smokeless Tobacco Never    Goals Met:  Independence with exercise equipment Exercise tolerated well No report of concerns or symptoms today Strength training completed today  Goals Unmet:  Not Applicable  Comments: Pt able to follow exercise prescription today without complaint.  Will continue to monitor for progression.    Dr. Oneil Pinal is Medical Director for [facility] Cardiac Rehabilitation.  Dr. Fuad Aleskerov is Medical Director for [facility] Pulmonary Rehabilitation.

## 2024-09-14 ENCOUNTER — Encounter

## 2024-09-14 DIAGNOSIS — Z955 Presence of coronary angioplasty implant and graft: Secondary | ICD-10-CM | POA: Diagnosis not present

## 2024-09-14 NOTE — Progress Notes (Signed)
 Daily Session Note  Patient Details  Name: James Arellano MRN: [id number] Date of Birth: [birth year] Referring Provider:   Flowsheet Row Cardiac Rehab from 08/31/2024 in [facility] Cardiac and Pulmonary Rehab  Referring Provider Dr. Lonni End    Encounter Date: 09/14/2024  Check In:  Session Check In - 09/14/24 1531       Check-In   Supervising physician immediately available to respond to emergencies See telemetry face sheet for immediately available ER MD    Location [facility]-Cardiac & Pulmonary Rehab    Staff Present Burnard Davenport [facility] Dyane BS, ACSM CEP, Exercise Physiologist;Joseph Rolinda RCP,RRT,BSRT;Meredith Tressa RN,BSN    Virtual Visit No    Medication changes reported     No    Fall or balance concerns reported    No    Tobacco Cessation No Change    Warm-up and Cool-down Performed on first and last piece of equipment    Resistance Training Performed Yes    VAD Patient? No    PAD/SET Patient? No      Pain Assessment   Currently in Pain? No/denies             Social History   Tobacco Use  Smoking Status Never  Smokeless Tobacco Never    Goals Met:  Independence with exercise equipment Exercise tolerated well No report of concerns or symptoms today Strength training completed today  Goals Unmet:  Not Applicable  Comments: Pt able to follow exercise prescription today without complaint.  Will continue to monitor for progression.    Dr. Oneil Pinal is Medical Director for [facility] Cardiac Rehabilitation.  Dr. Fuad Aleskerov is Medical Director for [facility] Pulmonary Rehabilitation.

## 2024-09-15 ENCOUNTER — Encounter: Admitting: *Deleted

## 2024-09-15 DIAGNOSIS — Z955 Presence of coronary angioplasty implant and graft: Secondary | ICD-10-CM | POA: Diagnosis not present

## 2024-09-15 NOTE — Progress Notes (Signed)
 Daily Session Note  Patient Details  Name: GULED GAHAN MRN: [id number] Date of Birth: May 31, 1961 Referring Provider:   Flowsheet Row Cardiac Rehab from 08/31/2024 in [facility] Cardiac and Pulmonary Rehab  Referring Provider Dr. Lonni End    Encounter Date: 09/15/2024  Check In:  Session Check In - 09/15/24 1537       Check-In   Supervising physician immediately available to respond to emergencies See telemetry face sheet for immediately available ER MD    Location [facility]-Cardiac & Pulmonary Rehab    Staff Present Hoy Rodney RN,BSN;Margaret Best, MS, Exercise Physiologist;Noah Tickle, BS, Exercise Physiologist;Joseph Rolinda RCP,RRT,BSRT    Virtual Visit No    Medication changes reported     No    Fall or balance concerns reported    No    Warm-up and Cool-down Performed on first and last piece of equipment    Resistance Training Performed Yes    VAD Patient? No    PAD/SET Patient? No      Pain Assessment   Currently in Pain? No/denies             Social History   Tobacco Use  Smoking Status Never  Smokeless Tobacco Never    Goals Met:  Independence with exercise equipment Exercise tolerated well No report of concerns or symptoms today Strength training completed today  Goals Unmet:  Not Applicable  Comments: Pt able to follow exercise prescription today without complaint.  Will continue to monitor for progression.    Dr. Oneil Pinal is Medical Director for [facility] Cardiac Rehabilitation.  Dr. Fuad Aleskerov is Medical Director for [facility] Pulmonary Rehabilitation.

## 2024-09-19 ENCOUNTER — Encounter: Payer: Self-pay | Admitting: Family Medicine

## 2024-09-19 ENCOUNTER — Ambulatory Visit: Admitting: Family Medicine

## 2024-09-19 VITALS — BP 110/78 | HR 61 | Ht 72.0 in | Wt 192.1 lb

## 2024-09-19 DIAGNOSIS — K2100 Gastro-esophageal reflux disease with esophagitis, without bleeding: Secondary | ICD-10-CM

## 2024-09-19 DIAGNOSIS — F3341 Major depressive disorder, recurrent, in partial remission: Secondary | ICD-10-CM

## 2024-09-19 DIAGNOSIS — I10 Essential (primary) hypertension: Secondary | ICD-10-CM

## 2024-09-19 DIAGNOSIS — H35033 Hypertensive retinopathy, bilateral: Secondary | ICD-10-CM

## 2024-09-19 DIAGNOSIS — E785 Hyperlipidemia, unspecified: Secondary | ICD-10-CM

## 2024-09-19 DIAGNOSIS — R55 Syncope and collapse: Secondary | ICD-10-CM

## 2024-09-19 DIAGNOSIS — K22719 Barrett's esophagus with dysplasia, unspecified: Secondary | ICD-10-CM | POA: Diagnosis not present

## 2024-09-19 DIAGNOSIS — I25118 Atherosclerotic heart disease of native coronary artery with other forms of angina pectoris: Secondary | ICD-10-CM

## 2024-09-19 MED ORDER — OMEPRAZOLE 40 MG PO CPDR: 40.0000 mg | DELAYED_RELEASE_CAPSULE | Freq: Every day | ORAL | Status: AC

## 2024-09-19 NOTE — Progress Notes (Unsigned)
 Established patient visit   Patient: James Arellano   DOB: 1960/11/25   63 y.o. Male  MRN: [id number] Visit Date: 09/19/2024  Today's healthcare provider: Rockie Agent, MD   Chief Complaint  Patient presents with  . Medical Management of Chronic Issues    Patient is present for follow up, patient has seen cardiology and now has 2 stents placed. Is feeling well today   . Hypertension    Patient says cardiology took him off of lisinopril , he has since resumed taking this due to BP running high at home. Checks BP 2-3 times daily    Subjective     HPI     Medical Management of Chronic Issues    Additional comments: Patient is present for follow up, patient has seen cardiology and now has 2 stents placed. Is feeling well today         Hypertension    Additional comments: Patient says cardiology took him off of lisinopril , he has since resumed taking this due to BP running high at home. Checks BP 2-3 times daily       Last edited by Cherry Chiquita HERO, CMA on 09/19/2024  3:16 PM.       Discussed the use of AI scribe software for clinical note transcription with the patient, who gave verbal consent to proceed.  History of Present Illness      Past Medical History:  Diagnosis Date  . CAD (coronary artery disease) 08/09/2024  . Depression   . GERD (gastroesophageal reflux disease)   . Hypertension About 10 yrs    Medications: Outpatient Medications Prior to Visit  Medication Sig  . ALPRAZolam  (XANAX ) 0.5 MG tablet TAKE 1 TABLET BY MOUTH TWICE  DAILY  . amLODipine  (NORVASC ) 5 MG tablet Take 1 tablet (5 mg total) by mouth daily.  . aspirin  EC 81 MG tablet Take 1 tablet (81 mg total) by mouth daily. Swallow whole.  SABRA atorvastatin (LIPITOR) 80 MG tablet Take 1 tablet (80 mg total) by mouth daily.  . buPROPion  (WELLBUTRIN  XL) 150 MG 24 hr tablet Take 3 tablets (450 mg total) by mouth daily.  SABRA ezetimibe (ZETIA) 10 MG tablet Take 1 tablet (10 mg total)  by mouth daily.  . lisinopril  (ZESTRIL ) 40 MG tablet Take 40 mg by mouth daily.  . nitroGLYCERIN  (NITROSTAT ) 0.4 MG SL tablet Place 1 tablet (0.4 mg total) under the tongue every 5 (five) minutes as needed for chest pain.  SABRA omeprazole (PRILOSEC) 40 MG capsule Take 1 capsule (40 mg total) by mouth 2 (two) times daily.  . prasugrel (EFFIENT) 10 MG TABS tablet Take 1 tablet (10 mg total) by mouth daily.  . vortioxetine  HBr (TRINTELLIX ) 20 MG TABS tablet Take 1 tablet (20 mg total) by mouth daily.   Facility-Administered Medications Prior to Visit  Medication Dose Route Frequency Provider  . diphenoxylate -atropine  (LOMOTIL ) 2.5-0.025 MG per tablet 1 tablet  1 tablet Oral QID PRN Bertrum Charlie CROME, MD  . promethazine  (PHENERGAN ) tablet 25 mg  25 mg Oral Q6H PRN Bertrum Charlie CROME, MD    Review of Systems  Last CBC Lab Results  Component Value Date   WBC 8.4 08/22/2024   HGB 13.3 08/22/2024   HCT 39.8 08/22/2024   MCV 98 (H) 08/22/2024   MCH 32.6 08/22/2024   RDW 12.7 08/22/2024   PLT 334 08/22/2024   Last metabolic panel Lab Results  Component Value Date   GLUCOSE 104 (H) 08/11/2024   NA  139 08/11/2024   K 3.8 08/11/2024   CL 105 08/11/2024   CO2 26 08/11/2024   BUN 28 (H) 08/11/2024   CREATININE 1.12 08/11/2024   GFRNONAA >60 08/11/2024   CALCIUM 8.9 08/11/2024   PROT 6.4 (L) 08/08/2024   ALBUMIN 3.9 08/08/2024   LABGLOB 2.6 08/19/2023   AGRATIO 2.0 09/25/2021   BILITOT 1.2 08/08/2024   ALKPHOS 43 08/08/2024   AST 14 (L) 08/08/2024   ALT 13 08/08/2024   ANIONGAP 8 08/11/2024   Last lipids Lab Results  Component Value Date   CHOL 217 (H) 08/09/2024   HDL 51 08/09/2024   LDLCALC 156 (H) 08/09/2024   TRIG 49 08/09/2024   CHOLHDL 4.3 08/09/2024  The 10-year ASCVD risk score (Arnett DK, et al., 2019) is: 9.6%  Last hemoglobin A1c Lab Results  Component Value Date   HGBA1C 4.8 08/08/2024   Last thyroid functions Lab Results  Component Value Date   TSH 1.360  09/25/2021   Last vitamin D No results found for: 25OHVITD2, 25OHVITD3, VD25OH Last vitamin B12 and Folate No results found for: VITAMINB12, FOLATE   {See past labs  Heme  Chem  Endocrine  Serology  Results Review (optional):1}   Objective    BP 110/78 (BP Location: Right Arm, Patient Position: Sitting, Cuff Size: Normal)   Pulse 61   Ht 6' (1.829 m)   Wt 192 lb 1.6 oz (87.1 kg)   SpO2 100%   BMI 26.05 kg/m  BP Readings from Last 3 Encounters:  09/19/24 110/78  08/22/24 100/70  08/11/24 113/75   Wt Readings from Last 3 Encounters:  09/19/24 192 lb 1.6 oz (87.1 kg)  08/31/24 199 lb 4.8 oz (90.4 kg)  08/22/24 202 lb 3.2 oz (91.7 kg)    {See vitals history (optional):1}    Physical Exam  ***  No results found for any visits on 09/19/24.  Assessment & Plan     Problem List Items Addressed This Visit     Barrett esophagus   Depression   Essential hypertension - Primary   Relevant Medications   lisinopril  (ZESTRIL ) 40 MG tablet   GERD with esophagitis   Hyperlipidemia LDL goal <70   Relevant Medications   lisinopril  (ZESTRIL ) 40 MG tablet   Hypertensive retinopathy of both eyes   Syncope    Assessment and Plan Assessment & Plan      No follow-ups on file.         Rockie Agent, MD  [facility] [phone] (phone) [phone] (fax)  [facility]

## 2024-09-20 ENCOUNTER — Other Ambulatory Visit: Payer: Self-pay | Admitting: Adult Health

## 2024-09-20 DIAGNOSIS — G4700 Insomnia, unspecified: Secondary | ICD-10-CM

## 2024-09-20 DIAGNOSIS — F411 Generalized anxiety disorder: Secondary | ICD-10-CM

## 2024-09-20 NOTE — Assessment & Plan Note (Signed)
  History of syncope Syncope related to hypotension and coronary artery disease. Recent hospitalization for syncope and unstable angina. Reports occasional dizziness, lightheadedness, and pressure moments since stent placement, which are improving and brief. - Continue to monitor blood pressure regularly. Follow up with cardiology

## 2024-09-20 NOTE — Assessment & Plan Note (Signed)
 Depression and anxiety Chronic depression and anxiety managed with Wellbutrin  and alprazolam . Reports using Wellbutrin  primarily on workdays for focus. - Continue Wellbutrin  450 mg daily. - Continue alprazolam  0.5 mg twice daily. - continue follow up with behavioral health specialist

## 2024-09-20 NOTE — Assessment & Plan Note (Signed)
 Gastroesophageal reflux disease with esophagitis Chronic condition  GERD with esophagitis managed with omeprazole. Recent adjustment to reduce dosage due to previous diagnosis of Barrett's esophagus, which was later re-evaluated and not confirmed. - Continue omeprazole 40 mg once daily.

## 2024-09-20 NOTE — Assessment & Plan Note (Signed)
 Essential hypertension Chronic condition  Hypertension is well-controlled with current medication regimen. Blood pressure readings at home are stable, with a recent reading of 110/78 mmHg. Lisinopril  was initially stopped due to hypotension but has been resumed without issues. Consideration to reduce lisinopril  dosage to prevent potential hypotension. - Reduced lisinopril  to 20 mg daily. - Monitor blood pressure at home and during cardiac rehab. - Report any symptoms of hypotension or adverse effects.

## 2024-09-20 NOTE — Assessment & Plan Note (Signed)
 Hyperlipidemia Chronic  Managed with atorvastatin and Zetia. Recent adjustments in medication due to coronary artery disease management. - Continue atorvastatin 80 mg daily. - Continue Zetia 10 mg daily.

## 2024-09-20 NOTE — Assessment & Plan Note (Signed)
 Chronic condition  Coronary artery disease with prior stent placement and associated chest pain Coronary artery disease with recent stent placement. Reports occasional pressure but no significant chest pain or shortness of breath during cardiac rehab. No dizziness or lightheadedness reported. Continues to participate in cardiac rehab with good progress. - Continue aspirin  81 mg daily. - Continue prasugrel 2 mg daily. - Continue nitroglycerin  as needed for chest pain. - Continue cardiac rehab.

## 2024-09-21 ENCOUNTER — Encounter: Admitting: Emergency Medicine

## 2024-09-21 DIAGNOSIS — Z955 Presence of coronary angioplasty implant and graft: Secondary | ICD-10-CM

## 2024-09-21 NOTE — Progress Notes (Signed)
 Cardiac Individual Treatment Plan  Patient Details  Name: JAMAAL BERNASCONI MRN: [id number] Date of Birth: 1961/01/03 Referring Provider:   Flowsheet Row Cardiac Rehab from 08/31/2024 in [facility] Cardiac and Pulmonary Rehab  Referring Provider Dr. Lonni End    Initial Encounter Date:  Flowsheet Row Cardiac Rehab from 08/31/2024 in [facility] Cardiac and Pulmonary Rehab  Date 08/31/24    Visit Diagnosis: Status post coronary artery stent placement  Patient's Home Medications on Admission:  Current Outpatient Medications:    ALPRAZolam  (XANAX ) 0.5 MG tablet, TAKE 1 TABLET BY MOUTH TWICE  DAILY, Disp: 180 tablet, Rfl: 0   amLODipine  (NORVASC ) 5 MG tablet, Take 1 tablet (5 mg total) by mouth daily., Disp: 90 tablet, Rfl: 3   aspirin  EC 81 MG tablet, Take 1 tablet (81 mg total) by mouth daily. Swallow whole., Disp: , Rfl:    atorvastatin (LIPITOR) 80 MG tablet, Take 1 tablet (80 mg total) by mouth daily., Disp: 90 tablet, Rfl: 3   buPROPion  (WELLBUTRIN  XL) 150 MG 24 hr tablet, Take 3 tablets (450 mg total) by mouth daily., Disp: 270 tablet, Rfl: 1   ezetimibe (ZETIA) 10 MG tablet, Take 1 tablet (10 mg total) by mouth daily., Disp: 90 tablet, Rfl: 3   lisinopril  (ZESTRIL ) 40 MG tablet, Take 20 mg by mouth daily., Disp: , Rfl:    nitroGLYCERIN  (NITROSTAT ) 0.4 MG SL tablet, Place 1 tablet (0.4 mg total) under the tongue every 5 (five) minutes as needed for chest pain., Disp: 25 tablet, Rfl: 3   omeprazole (PRILOSEC) 40 MG capsule, Take 1 capsule (40 mg total) by mouth daily., Disp: , Rfl:    prasugrel (EFFIENT) 10 MG TABS tablet, Take 1 tablet (10 mg total) by mouth daily., Disp: 90 tablet, Rfl: 3   vortioxetine  HBr (TRINTELLIX ) 20 MG TABS tablet, Take 1 tablet (20 mg total) by mouth daily., Disp: 90 tablet, Rfl: 3  Past Medical History: Past Medical History:  Diagnosis Date   CAD (coronary artery disease) 08/09/2024   Depression    GERD (gastroesophageal reflux disease)    Hypertension  About 10 yrs    Tobacco Use: Social History   Tobacco Use  Smoking Status Never  Smokeless Tobacco Never    Labs: Review Flowsheet  More data exists      Latest Ref Rng & Units 12/17/2018 02/23/2020 09/25/2021 08/08/2024 08/09/2024  Labs for ITP Cardiac and Pulmonary Rehab  Cholestrol 0 - 200 mg/dL 791  794  757  - 782   LDL (calc) 0 - 99 mg/dL 855  858  824  - 843   HDL-C >40 mg/dL 52  51  54  - 51   Trlycerides <150 mg/dL 61  74  76  - 49   Hemoglobin A1c 4.8 - 5.6 % - - - 4.8  -     Exercise Target Goals: Exercise Program Goal: Individual exercise prescription set using results from initial 6 min walk test and THRR while considering  patient's activity barriers and safety.   Exercise Prescription Goal: Initial exercise prescription builds to 30-45 minutes a day of aerobic activity, 2-3 days per week.  Home exercise guidelines will be given to patient during program as part of exercise prescription that the participant will acknowledge.   Education: Aerobic Exercise: - Group verbal and visual presentation on the components of exercise prescription. Introduces F.I.T.T principle from ACSM for exercise prescriptions.  Reviews F.I.T.T. principles of aerobic exercise including progression. Written material provided at class time. Flowsheet Row Cardiac  Rehab from 09/14/2024 in [facility] Cardiac and Pulmonary Rehab  Education need identified 08/31/24    Education: Resistance Exercise: - Group verbal and visual presentation on the components of exercise prescription. Introduces F.I.T.T principle from ACSM for exercise prescriptions  Reviews F.I.T.T. principles of resistance exercise including progression. Written material provided at class time.    Education: Exercise & Equipment Safety: - Individual verbal instruction and demonstration of equipment use and safety with use of the equipment. Flowsheet Row Cardiac Rehab from 09/14/2024 in [facility] Cardiac and Pulmonary Rehab  Date 08/31/24   Educator [facility]  Instruction Review Code 1- Verbalizes Understanding    Education: Exercise Physiology & General Exercise Guidelines: - Group verbal and written instruction with models to review the exercise physiology of the cardiovascular system and associated critical values. Provides general exercise guidelines with specific guidelines to those with heart or lung disease. Written material provided at class time. Flowsheet Row Cardiac Rehab from 09/14/2024 in [facility] Cardiac and Pulmonary Rehab  Education need identified 08/31/24    Education: Flexibility, Balance, Mind/Body Relaxation: - Group verbal and visual presentation with interactive activity on the components of exercise prescription. Introduces F.I.T.T principle from ACSM for exercise prescriptions. Reviews F.I.T.T. principles of flexibility and balance exercise training including progression. Also discusses the mind body connection.  Reviews various relaxation techniques to help reduce and manage stress (i.e. Deep breathing, progressive muscle relaxation, and visualization). Balance handout provided to take home. Written material provided at class time.   Activity Barriers & Risk Stratification:  Activity Barriers & Cardiac Risk Stratification - 08/31/24 1516       Activity Barriers & Cardiac Risk Stratification   Activity Barriers Other (comment)    Comments ruptured right bicep tendon; right rotator cuff tear    Cardiac Risk Stratification Moderate          6 Minute Walk:  6 Minute Walk     Row Name 08/31/24 1515         6 Minute Walk   Phase Initial     Distance 1350 feet     Walk Time 6 minutes     # of Rest Breaks 0     MPH 2.56     METS 3.5     RPE 10     Perceived Dyspnea  0     VO2 Peak 12.3     Symptoms No     Resting HR 74 bpm     Resting BP 116/72     Resting Oxygen Saturation  99 %     Exercise Oxygen Saturation  during 6 min walk 99 %     Max Ex. HR 105 bpm     Max Ex. BP 122/82     2 Minute  Post BP 114/78        Oxygen Initial Assessment:   Oxygen Re-Evaluation:   Oxygen Discharge (Final Oxygen Re-Evaluation):   Initial Exercise Prescription:  Initial Exercise Prescription - 08/31/24 1500       Date of Initial Exercise RX and Referring Provider   Date 08/31/24    Referring Provider Dr. Lonni End      Oxygen   Maintain Oxygen Saturation 88% or higher      Treadmill   MPH 2.6    Grade 1    Minutes 15    METs 3.35      Elliptical   Level 1    Speed 3    Minutes 15    METs 3.5  Rower   Level 10    Watts 50    Minutes 15    METs 3.5      Prescription Details   Duration Progress to 30 minutes of continuous aerobic without signs/symptoms of physical distress      Intensity   THRR 40-80% of Max Heartrate 107-141    Ratings of Perceived Exertion 11-13    Perceived Dyspnea 0-4      Progression   Progression Continue to progress workloads to maintain intensity without signs/symptoms of physical distress.      Resistance Training   Training Prescription Yes    Weight 10lb    Reps 10-15          Perform Capillary Blood Glucose checks as needed.  Exercise Prescription Changes:   Exercise Prescription Changes     Row Name 08/31/24 1500 09/15/24 1500           Response to Exercise   Blood Pressure (Admit) 116/72 108/56      Blood Pressure (Exercise) 122/82 140/80      Blood Pressure (Exit) 114/78 100/62      Heart Rate (Admit) 74 bpm 72 bpm      Heart Rate (Exercise) 105 bpm 129 bpm      Heart Rate (Exit) 80 bpm 104 bpm      Oxygen Saturation (Admit) 99 % --      Oxygen Saturation (Exercise) 99 % --      Oxygen Saturation (Exit) 99 % --      Rating of Perceived Exertion (Exercise) 10 13      Perceived Dyspnea (Exercise) 0 0      Symptoms none none      Comments results 1st week of exercise      Duration -- Progress to 30 minutes of  aerobic without signs/symptoms of physical distress      Intensity -- THRR unchanged         Progression   Progression -- Continue to progress workloads to maintain intensity without signs/symptoms of physical distress.      Average METs -- 3.4        Resistance Training   Training Prescription -- Yes      Weight -- 10lb      Reps -- 10-15        Interval Training   Interval Training -- No        Treadmill   MPH -- 3.2      Grade -- 1      Minutes -- 15      METs -- 3.89        Elliptical   Level -- 3      Speed -- 3      Minutes -- 15      METs -- 2.8        Oxygen   Maintain Oxygen Saturation -- 88% or higher         Exercise Comments:   Exercise Comments     Row Name 09/05/24 1538           Exercise Comments First full day of exercise!  Patient was oriented to gym and equipment including functions, settings, policies, and procedures.  Patient's individual exercise prescription and treatment plan were reviewed.  All starting workloads were established based on the results of the 6 minute walk test done at initial orientation visit.  The plan for exercise progression was also introduced and progression will be customized based on patient's performance  and goals.          Exercise Goals and Review:   Exercise Goals     Row Name 08/31/24 1518             Exercise Goals   Increase Physical Activity Yes       Intervention Develop an individualized exercise prescription for aerobic and resistive training based on initial evaluation findings, risk stratification, comorbidities and participant's personal goals.;Provide advice, education, support and counseling about physical activity/exercise needs.       Expected Outcomes Short Term: Attend rehab on a regular basis to increase amount of physical activity.;Long Term: Add in home exercise to make exercise part of routine and to increase amount of physical activity.;Long Term: Exercising regularly at least 3-5 days a week.       Increase Strength and Stamina Yes       Intervention Provide advice,  education, support and counseling about physical activity/exercise needs.;Develop an individualized exercise prescription for aerobic and resistive training based on initial evaluation findings, risk stratification, comorbidities and participant's personal goals.       Expected Outcomes Short Term: Increase workloads from initial exercise prescription for resistance, speed, and METs.;Long Term: Improve cardiorespiratory fitness, muscular endurance and strength as measured by increased METs and functional capacity ( );Short Term: Perform resistance training exercises routinely during rehab and add in resistance training at home       Able to understand and use rate of perceived exertion (RPE) scale Yes       Intervention Provide education and explanation on how to use RPE scale       Expected Outcomes Short Term: Able to use RPE daily in rehab to express subjective intensity level;Long Term:  Able to use RPE to guide intensity level when exercising independently       Able to understand and use Dyspnea scale Yes       Intervention Provide education and explanation on how to use Dyspnea scale       Expected Outcomes Long Term: Able to use Dyspnea scale to guide intensity level when exercising independently;Short Term: Able to use Dyspnea scale daily in rehab to express subjective sense of shortness of breath during exertion       Knowledge and understanding of Target Heart Rate Range (THRR) Yes       Intervention Provide education and explanation of THRR including how the numbers were predicted and where they are located for reference       Expected Outcomes Short Term: Able to state/look up THRR;Short Term: Able to use daily as guideline for intensity in rehab;Long Term: Able to use THRR to govern intensity when exercising independently       Able to check pulse independently Yes       Intervention Provide education and demonstration on how to check pulse in carotid and radial arteries.;Review the  importance of being able to check your own pulse for safety during independent exercise       Expected Outcomes Short Term: Able to explain why pulse checking is important during independent exercise;Long Term: Able to check pulse independently and accurately       Understanding of Exercise Prescription Yes       Intervention Provide education, explanation, and written materials on patient's individual exercise prescription       Expected Outcomes Short Term: Able to explain program exercise prescription;Long Term: Able to explain home exercise prescription to exercise independently  Exercise Goals Re-Evaluation :  Exercise Goals Re-Evaluation     Row Name 09/05/24 1538 09/15/24 1506           Exercise Goal Re-Evaluation   Exercise Goals Review Increase Physical Activity;Understanding of Exercise Prescription;Knowledge and understanding of Target Heart Rate Range (THRR);Able to understand and use rate of perceived exertion (RPE) scale;Increase Strength and Stamina;Able to understand and use Dyspnea scale;Able to check pulse independently Increase Physical Activity;Understanding of Exercise Prescription;Increase Strength and Stamina      Comments Reviewed RPE and dyspnea scale, THR and program prescription with pt today.  Pt voiced understanding and was given a copy of goals to take home. Criag is off to a good start in the program. He completed his first week of exercise in this review. He was able to work at a speed of 3.2 mph on the treadmill with 1% incline. He also worked at level 3 on the elliptical. We will continue to monitor his progress in the program.      Expected Outcomes Short: Use RPE daily to regulate intensity.  Long: Follow program prescription in THR. Short: Continue to follow current exercise prescription. Long: Continue exercise to improve strength and stamina.         Discharge Exercise Prescription (Final Exercise Prescription Changes):  Exercise Prescription  Changes - 09/15/24 1500       Response to Exercise   Blood Pressure (Admit) 108/56    Blood Pressure (Exercise) 140/80    Blood Pressure (Exit) 100/62    Heart Rate (Admit) 72 bpm    Heart Rate (Exercise) 129 bpm    Heart Rate (Exit) 104 bpm    Rating of Perceived Exertion (Exercise) 13    Perceived Dyspnea (Exercise) 0    Symptoms none    Comments 1st week of exercise    Duration Progress to 30 minutes of  aerobic without signs/symptoms of physical distress    Intensity THRR unchanged      Progression   Progression Continue to progress workloads to maintain intensity without signs/symptoms of physical distress.    Average METs 3.4      Resistance Training   Training Prescription Yes    Weight 10lb    Reps 10-15      Interval Training   Interval Training No      Treadmill   MPH 3.2    Grade 1    Minutes 15    METs 3.89      Elliptical   Level 3    Speed 3    Minutes 15    METs 2.8      Oxygen   Maintain Oxygen Saturation 88% or higher          Nutrition:  Target Goals: Understanding of nutrition guidelines, daily intake of sodium 1500mg , cholesterol 200mg , calories 30% from fat and 7% or less from saturated fats, daily to have 5 or more servings of fruits and vegetables.  Education: Nutrition 1 -Group instruction provided by verbal, written material, interactive activities, discussions, models, and posters to present general guidelines for heart healthy nutrition including macronutrients, label reading, and promoting whole foods over processed counterparts. Education serves as pensions consultant of discussion of heart healthy eating for all. Written material provided at class time.    Education: Nutrition 2 -Group instruction provided by verbal, written material, interactive activities, discussions, models, and posters to present general guidelines for heart healthy nutrition including sodium, cholesterol, and saturated fat. Providing guidance of habit forming to  improve blood  pressure, cholesterol, and body weight. Written material provided at class time.     Biometrics:  Pre Biometrics - 08/31/24 1518       Pre Biometrics   Height 6' 0.1 (1.831 m)    Weight 199 lb 4.8 oz (90.4 kg)    Waist Circumference 36 inches    Hip Circumference 40.5 inches    Waist to Hip Ratio 0.89 %    BMI (Calculated) 26.97    Single Leg Stand 11.3 seconds           Nutrition Therapy Plan and Nutrition Goals:   Nutrition Assessments:  MEDIFICTS Score Key: >=70 Need to make dietary changes  40-70 Heart Healthy Diet <= 40 Therapeutic Level Cholesterol Diet  Flowsheet Row Cardiac Rehab from 08/31/2024 in [facility] Cardiac and Pulmonary Rehab  Picture Your Plate Total Score on Admission 64   Picture Your Plate Scores: <59 Unhealthy dietary pattern with much room for improvement. 41-50 Dietary pattern unlikely to meet recommendations for good health and room for improvement. 51-60 More healthful dietary pattern, with some room for improvement.  >60 Healthy dietary pattern, although there may be some specific behaviors that could be improved.    Nutrition Goals Re-Evaluation:   Nutrition Goals Discharge (Final Nutrition Goals Re-Evaluation):   Psychosocial: Target Goals: Acknowledge presence or absence of significant depression and/or stress, maximize coping skills, provide positive support system. Participant is able to verbalize types and ability to use techniques and skills needed for reducing stress and depression.   Education: Stress, Anxiety, and Depression - Group verbal and visual presentation to define topics covered.  Reviews how body is impacted by stress, anxiety, and depression.  Also discusses healthy ways to reduce stress and to treat/manage anxiety and depression. Written material provided at class time.   Education: Sleep Hygiene -Provides group verbal and written instruction about how sleep can affect your health.  Define sleep  hygiene, discuss sleep cycles and impact of sleep habits. Review good sleep hygiene tips.   Initial Review & Psychosocial Screening:  Initial Psych Review & Screening - 08/25/24 1452       Initial Review   Current issues with Current Depression      Family Dynamics   Good Support System? Yes   wife     Barriers   Psychosocial barriers to participate in program There are no identifiable barriers or psychosocial needs.;The patient should benefit from training in stress management and relaxation.      Screening Interventions   Interventions Encouraged to exercise;Provide feedback about the scores to participant;To provide support and resources with identified psychosocial needs    Expected Outcomes Short Term goal: Utilizing psychosocial counselor, staff and physician to assist with identification of specific Stressors or current issues interfering with healing process. Setting desired goal for each stressor or current issue identified.;Long Term Goal: Stressors or current issues are controlled or eliminated.;Short Term goal: Identification and review with participant of any Quality of Life or Depression concerns found by scoring the questionnaire.;Long Term goal: The participant improves quality of Life and PHQ9 Scores as seen by post scores and/or verbalization of changes          Quality of Life Scores:   Quality of Life - 08/31/24 1519       Quality of Life   Select Quality of Life      Quality of Life Scores   Health/Function Pre 22.97 %    Socioeconomic Pre 19.19 %    Psych/Spiritual Pre 9 %  Family Pre 15.6 %    GLOBAL Pre 18.26 %         Scores of 19 and below usually indicate a poorer quality of life in these areas.  A difference of  2-3 points is a clinically meaningful difference.  A difference of 2-3 points in the total score of the Quality of Life Index has been associated with significant improvement in overall quality of life, self-image, physical symptoms, and  general health in studies assessing change in quality of life.  PHQ-9: Review Flowsheet  More data exists      09/19/2024 08/31/2024 07/20/2024 01/18/2024 08/19/2023  Depression screen PHQ 2/9  Decreased Interest 0 0 0 0 0  Down, Depressed, Hopeless 0 0 0 0 0  PHQ - 2 Score 0 0 0 0 0  Altered sleeping 0 0 0 - -  Tired, decreased energy 0 0 0 - -  Change in appetite 0 0 0 - -  Feeling bad or failure about yourself  0 0 0 - -  Trouble concentrating 0 0 0 - -  Moving slowly or fidgety/restless 0 0 0 - -  Suicidal thoughts 0 0 0 - -  PHQ-9 Score 0 0  0  - -    Details       Data saved with a previous flowsheet row definition        Interpretation of Total Score  Total Score Depression Severity:  1-4 = Minimal depression, 5-9 = Mild depression, 10-14 = Moderate depression, 15-19 = Moderately severe depression, 20-27 = Severe depression   Psychosocial Evaluation and Intervention:  Psychosocial Evaluation - 08/25/24 1453       Psychosocial Evaluation & Interventions   Interventions Stress management education;Relaxation education;Encouraged to exercise with the program and follow exercise prescription    Comments Marcey is looking forward to starting cardiac rehab to see how it will improve his daily life. He currently lives with his wife and states she is a great support for him. He reports that they both work full time, however they also enjoy taking on big home projects and that his is therapeutic for him. Marcey did report he has a ruptured bicep tendon and torn rotator cuff in his right arm, however states that this has minimal impact on his functional ability. He does admit to dealing with PTSD and depression, however he says these are well managed at this time.    Expected Outcomes Short: Attend cardiac rehab for education and exercise.Long: Develop and maintain positive self care habits.    Continue Psychosocial Services  Follow up required by staff          Psychosocial  Re-Evaluation:   Psychosocial Discharge (Final Psychosocial Re-Evaluation):   Vocational Rehabilitation: Provide vocational rehab assistance to qualifying candidates.   Vocational Rehab Evaluation & Intervention:  Vocational Rehab - 08/25/24 1451       Initial Vocational Rehab Evaluation & Intervention   Assessment shows need for Vocational Rehabilitation No          Education: Education Goals: Education classes will be provided on a variety of topics geared toward better understanding of heart health and risk factor modification. Participant will state understanding/return demonstration of topics presented as noted by education test scores.  Learning Barriers/Preferences:  Learning Barriers/Preferences - 08/25/24 1451       Learning Barriers/Preferences   Learning Barriers None    Learning Preferences None          General Cardiac Education Topics:  AED/CPR: -  Group verbal and written instruction with the use of models to demonstrate the basic use of the AED with the basic ABC's of resuscitation.   Test and Procedures: - Group verbal and visual presentation and models provide information about basic cardiac anatomy and function. Reviews the testing methods done to diagnose heart disease and the outcomes of the test results. Describes the treatment choices: Medical Management, Angioplasty, or Coronary Bypass Surgery for treating various heart conditions including Myocardial Infarction, Angina, Valve Disease, and Cardiac Arrhythmias. Written material provided at class time.   Medication Safety: - Group verbal and visual instruction to review commonly prescribed medications for heart and lung disease. Reviews the medication, class of the drug, and side effects. Includes the steps to properly store meds and maintain the prescription regimen. Written material provided at class time. Flowsheet Row Cardiac Rehab from 09/14/2024 in [facility] Cardiac and Pulmonary Rehab  Date  09/14/24  Educator lc  Instruction Review Code 1- Verbalizes Understanding    Intimacy: - Group verbal instruction through game format to discuss how heart and lung disease can affect sexual intimacy. Written material provided at class time.   Know Your Numbers and Heart Failure: - Group verbal and visual instruction to discuss disease risk factors for cardiac and pulmonary disease and treatment options.  Reviews associated critical values for Overweight/Obesity, Hypertension, Cholesterol, and Diabetes.  Discusses basics of heart failure: signs/symptoms and treatments.  Introduces Heart Failure Zone chart for action plan for heart failure. Written material provided at class time.   Infection Prevention: - Provides verbal and written material to individual with discussion of infection control including proper hand washing and proper equipment cleaning during exercise session. Flowsheet Row Cardiac Rehab from 09/14/2024 in [facility] Cardiac and Pulmonary Rehab  Date 08/31/24  Educator [facility]  Instruction Review Code 1- Verbalizes Understanding    Falls Prevention: - Provides verbal and written material to individual with discussion of falls prevention and safety. Flowsheet Row Cardiac Rehab from 09/14/2024 in [facility] Cardiac and Pulmonary Rehab  Date 08/31/24  Educator [facility]  Instruction Review Code 1- Verbalizes Understanding    Other: -Provides group and verbal instruction on various topics (see comments)   Knowledge Questionnaire Score:  Knowledge Questionnaire Score - 08/31/24 1521       Knowledge Questionnaire Score   Pre Score 24/26          Core Components/Risk Factors/Patient Goals at Admission:  Personal Goals and Risk Factors at Admission - 08/25/24 1450       Core Components/Risk Factors/Patient Goals on Admission    Weight Management Weight Maintenance;Yes    Intervention Weight Management: Develop a combined nutrition and exercise program designed to reach desired  caloric intake, while maintaining appropriate intake of nutrient and fiber, sodium and fats, and appropriate energy expenditure required for the weight goal.;Weight Management: Provide education and appropriate resources to help participant work on and attain dietary goals.    Expected Outcomes Weight Maintenance: Understanding of the daily nutrition guidelines, which includes 25-35% calories from fat, 7% or less cal from saturated fats, less than 200mg  cholesterol, less than 1.5gm of sodium, & 5 or more servings of fruits and vegetables daily;Short Term: Continue to assess and modify interventions until short term weight is achieved;Long Term: Adherence to nutrition and physical activity/exercise program aimed toward attainment of established weight goal;Understanding recommendations for meals to include 15-35% energy as protein, 25-35% energy from fat, 35-60% energy from carbohydrates, less than 200mg  of dietary cholesterol, 20-35 gm of total fiber daily;Understanding of  distribution of calorie intake throughout the day with the consumption of 4-5 meals/snacks    Hypertension Yes    Intervention Provide education on lifestyle modifcations including regular physical activity/exercise, weight management, moderate sodium restriction and increased consumption of fresh fruit, vegetables, and low fat dairy, alcohol moderation, and smoking cessation.;Monitor prescription use compliance.    Expected Outcomes Short Term: Continued assessment and intervention until BP is < 140/35mm HG in hypertensive participants. < 130/19mm HG in hypertensive participants with diabetes, heart failure or chronic kidney disease.;Long Term: Maintenance of blood pressure at goal levels.    Lipids Yes    Intervention Provide education and support for participant on nutrition & aerobic/resistive exercise along with prescribed medications to achieve LDL 70mg , HDL >40mg .    Expected Outcomes Short Term: Participant states understanding of  desired cholesterol values and is compliant with medications prescribed. Participant is following exercise prescription and nutrition guidelines.;Long Term: Cholesterol controlled with medications as prescribed, with individualized exercise RX and with personalized nutrition plan. Value goals: LDL < 70mg , HDL > 40 mg.          Education:Diabetes - Individual verbal and written instruction to review signs/symptoms of diabetes, desired ranges of glucose level fasting, after meals and with exercise. Acknowledge that pre and post exercise glucose checks will be done for 3 sessions at entry of program.   Core Components/Risk Factors/Patient Goals Review:    Core Components/Risk Factors/Patient Goals at Discharge (Final Review):    ITP Comments:  ITP Comments     Row Name 08/25/24 1444 08/31/24 1514 09/05/24 1537 09/21/24 1155     ITP Comments Initial phone call completed. Diagnosis can be found in [facility] 08/22/24. EP Orientation scheduled for 08/31/24 at 1:45pm. Completed and gym orientation for cardiac rehab. Initial ITP created and sent for review to Dr. Oneil Pinal, Medical Director. First full day of exercise!  Patient was oriented to gym and equipment including functions, settings, policies, and procedures.  Patient's individual exercise prescription and treatment plan were reviewed.  All starting workloads were established based on the results of the 6 minute walk test done at initial orientation visit.  The plan for exercise progression was also introduced and progression will be customized based on patient's performance and goals. 30 Day review completed. Medical Director ITP review done, changes made as directed, and signed approval by Medical Director.  New to program       Comments: 30 day review ITP

## 2024-09-21 NOTE — Progress Notes (Signed)
 Daily Session Note  Patient Details  Name: James Arellano MRN: [id number] Date of Birth: September 06, 1961 Referring Provider:   Flowsheet Row Cardiac Rehab from 08/31/2024 in [facility] Cardiac and Pulmonary Rehab  Referring Provider Dr. Lonni End    Encounter Date: 09/21/2024  Check In:  Session Check In - 09/21/24 1545       Check-In   Supervising physician immediately available to respond to emergencies See telemetry face sheet for immediately available ER MD    Location [facility]-Cardiac & Pulmonary Rehab    Staff Present Leita Franks RN,BSN;Meredith Tressa RN,BSN;Kelly Bollinger [facility] Dyane BS, ACSM CEP, Exercise Physiologist;Joseph Rolinda RCP,RRT,BSRT    Virtual Visit No    Medication changes reported     No    Fall or balance concerns reported    No    Tobacco Cessation No Change    Warm-up and Cool-down Performed on first and last piece of equipment    Resistance Training Performed Yes    VAD Patient? No    PAD/SET Patient? No      Pain Assessment   Currently in Pain? No/denies             Social History   Tobacco Use  Smoking Status Never  Smokeless Tobacco Never    Goals Met:  Independence with exercise equipment Exercise tolerated well No report of concerns or symptoms today Strength training completed today  Goals Unmet:  Not Applicable  Comments: Pt able to follow exercise prescription today without complaint.  Will continue to monitor for progression.    Dr. Oneil Pinal is Medical Director for [facility] Cardiac Rehabilitation.  Dr. Fuad Aleskerov is Medical Director for [facility] Pulmonary Rehabilitation.

## 2024-09-22 ENCOUNTER — Encounter: Admitting: Emergency Medicine

## 2024-09-22 DIAGNOSIS — Z955 Presence of coronary angioplasty implant and graft: Secondary | ICD-10-CM

## 2024-09-22 NOTE — Progress Notes (Signed)
 Daily Session Note  Patient Details  Name: James Arellano MRN: [id number] Date of Birth: Jun 29, 1961 Referring Provider:   Flowsheet Row Cardiac Rehab from 08/31/2024 in [facility] Cardiac and Pulmonary Rehab  Referring Provider Dr. Lonni End    Encounter Date: 09/22/2024  Check In:  Session Check In - 09/22/24 1601       Check-In   Supervising physician immediately available to respond to emergencies See telemetry face sheet for immediately available ER MD    Location [facility]-Cardiac & Pulmonary Rehab    Staff Present Leita Franks RN,BSN;Joseph [facility] BS, Exercise Physiologist;Noah Tickle, BS, Exercise Physiologist    Virtual Visit No    Medication changes reported     No    Fall or balance concerns reported    No    Tobacco Cessation No Change    Warm-up and Cool-down Performed on first and last piece of equipment    Resistance Training Performed Yes    VAD Patient? No    PAD/SET Patient? No      Pain Assessment   Currently in Pain? No/denies             Social History   Tobacco Use  Smoking Status Never  Smokeless Tobacco Never    Goals Met:  Independence with exercise equipment Exercise tolerated well No report of concerns or symptoms today Strength training completed today  Goals Unmet:  Not Applicable  Comments: Pt able to follow exercise prescription today without complaint.  Will continue to monitor for progression.    Dr. Oneil Pinal is Medical Director for [facility] Cardiac Rehabilitation.  Dr. Fuad Aleskerov is Medical Director for [facility] Pulmonary Rehabilitation.

## 2024-09-26 ENCOUNTER — Encounter: Admitting: Emergency Medicine

## 2024-09-26 DIAGNOSIS — Z955 Presence of coronary angioplasty implant and graft: Secondary | ICD-10-CM | POA: Diagnosis not present

## 2024-09-26 NOTE — Progress Notes (Signed)
 Daily Session Note  Patient Details  Name: James Arellano MRN: [id number] Date of Birth: 1961-11-01 Referring Provider:   Flowsheet Row Cardiac Rehab from 08/31/2024 in [facility] Cardiac and Pulmonary Rehab  Referring Provider Dr. Lonni End    Encounter Date: 09/26/2024  Check In:  Session Check In - 09/26/24 1549       Check-In   Supervising physician immediately available to respond to emergencies See telemetry face sheet for immediately available ER MD    Location [facility]-Cardiac & Pulmonary Rehab    Staff Present Fairy Plater RCP,RRT,BSRT;Pollie Poma RN,BSN;Maxon Burnell BS, Exercise Physiologist;Margaret Best, MS, Exercise Physiologist    Virtual Visit No    Medication changes reported     No    Fall or balance concerns reported    No    Tobacco Cessation No Change    Warm-up and Cool-down Performed on first and last piece of equipment    Resistance Training Performed Yes    VAD Patient? No    PAD/SET Patient? No      Pain Assessment   Currently in Pain? No/denies             Social History   Tobacco Use  Smoking Status Never  Smokeless Tobacco Never    Goals Met:  Independence with exercise equipment Exercise tolerated well No report of concerns or symptoms today Strength training completed today  Goals Unmet:  Not Applicable  Comments: Pt able to follow exercise prescription today without complaint.  Will continue to monitor for progression.    Dr. Oneil Pinal is Medical Director for [facility] Cardiac Rehabilitation.  Dr. Fuad Aleskerov is Medical Director for [facility] Pulmonary Rehabilitation.

## 2024-09-28 ENCOUNTER — Encounter: Admitting: Emergency Medicine

## 2024-09-28 ENCOUNTER — Other Ambulatory Visit (HOSPITAL_COMMUNITY): Payer: Self-pay

## 2024-09-28 DIAGNOSIS — Z955 Presence of coronary angioplasty implant and graft: Secondary | ICD-10-CM

## 2024-09-28 NOTE — Progress Notes (Signed)
 Daily Session Note  Patient Details  Name: James Arellano MRN: [id number] Date of Birth: 05-Aug-1961 Referring Provider:   Flowsheet Row Cardiac Rehab from 08/31/2024 in [facility] Cardiac and Pulmonary Rehab  Referring Provider Dr. Lonni End    Encounter Date: 09/28/2024  Check In:  Session Check In - 09/28/24 1535       Check-In   Supervising physician immediately available to respond to emergencies See telemetry face sheet for immediately available ER MD    Location [facility]-Cardiac & Pulmonary Rehab    Staff Present Leita Franks RN,BSN;Meredith Tressa RN,BSN;Margaret Best, MS, Exercise Physiologist    Virtual Visit No    Medication changes reported     No    Fall or balance concerns reported    No    Tobacco Cessation No Change    Warm-up and Cool-down Performed on first and last piece of equipment    Resistance Training Performed Yes    VAD Patient? No    PAD/SET Patient? No      Pain Assessment   Currently in Pain? No/denies             Social History   Tobacco Use  Smoking Status Never  Smokeless Tobacco Never    Goals Met:  Independence with exercise equipment Exercise tolerated well No report of concerns or symptoms today Strength training completed today  Goals Unmet:  Not Applicable  Comments: Pt able to follow exercise prescription today without complaint.  Will continue to monitor for progression.    Dr. Oneil Pinal is Medical Director for [facility] Cardiac Rehabilitation.  Dr. Fuad Aleskerov is Medical Director for [facility] Pulmonary Rehabilitation.

## 2024-10-02 ENCOUNTER — Other Ambulatory Visit: Payer: Self-pay | Admitting: Adult Health

## 2024-10-02 DIAGNOSIS — F411 Generalized anxiety disorder: Secondary | ICD-10-CM

## 2024-10-02 DIAGNOSIS — G4700 Insomnia, unspecified: Secondary | ICD-10-CM

## 2024-10-03 ENCOUNTER — Encounter: Attending: Internal Medicine | Admitting: Emergency Medicine

## 2024-10-03 DIAGNOSIS — Z955 Presence of coronary angioplasty implant and graft: Secondary | ICD-10-CM | POA: Diagnosis present

## 2024-10-03 NOTE — Progress Notes (Signed)
 Daily Session Note  Patient Details  Name: James Arellano MRN: [id number] Date of Birth: [birth year] Referring Provider:   Flowsheet Row Cardiac Rehab from 08/31/2024 in [facility] Cardiac and Pulmonary Rehab  Referring Provider Dr. Lonni End    Encounter Date: 10/03/2024  Check In:  Session Check In - 10/03/24 1545       Check-In   Supervising physician immediately available to respond to emergencies See telemetry face sheet for immediately available ER MD    Location [facility]-Cardiac & Pulmonary Rehab    Staff Present Leita Franks RN,BSN;Joseph [facility] BS, Exercise Physiologist;Margaret Best, MS, Exercise Physiologist    Virtual Visit No    Medication changes reported     No    Fall or balance concerns reported    No    Warm-up and Cool-down Performed on first and last piece of equipment    Resistance Training Performed Yes    VAD Patient? No    PAD/SET Patient? No      Pain Assessment   Currently in Pain? No/denies             Social History   Tobacco Use  Smoking Status Never  Smokeless Tobacco Never    Goals Met:  Independence with exercise equipment Exercise tolerated well No report of concerns or symptoms today Strength training completed today  Goals Unmet:  Not Applicable  Comments: Pt able to follow exercise prescription today without complaint.  Will continue to monitor for progression.    Dr. Oneil Pinal is Medical Director for [facility] Cardiac Rehabilitation.  Dr. Fuad Aleskerov is Medical Director for [facility] Pulmonary Rehabilitation.

## 2024-10-05 ENCOUNTER — Encounter

## 2024-10-05 DIAGNOSIS — Z955 Presence of coronary angioplasty implant and graft: Secondary | ICD-10-CM

## 2024-10-05 NOTE — Progress Notes (Signed)
 Daily Session Note  Patient Details  Name: James Arellano MRN: [id number] Date of Birth: [birth year] Referring Provider:   Flowsheet Row Cardiac Rehab from 08/31/2024 in [facility] Cardiac and Pulmonary Rehab  Referring Provider Dr. Lonni End    Encounter Date: 10/05/2024  Check In:  Session Check In - 10/05/24 1542       Check-In   Supervising physician immediately available to respond to emergencies See telemetry face sheet for immediately available ER MD    Location [facility]-Cardiac & Pulmonary Rehab    Staff Present Burnard Davenport RN,BSN,MPA;Joseph Rolinda RCP,RRT,BSRT;Laura Cates RN,BSN;Kaydince Towles [place] BS, ACSM CEP, Exercise Physiologist    Virtual Visit No    Medication changes reported     No    Fall or balance concerns reported    No    Tobacco Cessation No Change    Warm-up and Cool-down Performed on first and last piece of equipment    Resistance Training Performed Yes    VAD Patient? No    PAD/SET Patient? No      Pain Assessment   Currently in Pain? No/denies             Social History   Tobacco Use  Smoking Status Never  Smokeless Tobacco Never    Goals Met:  Independence with exercise equipment Exercise tolerated well No report of concerns or symptoms today Strength training completed today  Goals Unmet:  Not Applicable  Comments: Pt able to follow exercise prescription today without complaint.  Will continue to monitor for progression.    Dr. Oneil Pinal is Medical Director for [facility] Cardiac Rehabilitation.  Dr. Fuad Aleskerov is Medical Director for [facility] Pulmonary Rehabilitation.

## 2024-10-06 ENCOUNTER — Encounter: Admitting: *Deleted

## 2024-10-06 DIAGNOSIS — Z955 Presence of coronary angioplasty implant and graft: Secondary | ICD-10-CM | POA: Diagnosis not present

## 2024-10-06 NOTE — Progress Notes (Signed)
 Daily Session Note  Patient Details  Name: JAVARIE CRISP MRN: [id number] Date of Birth: 11/19/1960 Referring Provider:   Flowsheet Row Cardiac Rehab from 08/31/2024 in [facility] Cardiac and Pulmonary Rehab  Referring Provider Dr. Lonni End    Encounter Date: 10/06/2024  Check In:  Session Check In - 10/06/24 1548       Check-In   Supervising physician immediately available to respond to emergencies See telemetry face sheet for immediately available ER MD    Location [facility]-Cardiac & Pulmonary Rehab    Staff Present Othel Durand, RN, BSN, CCRP;Meredith Tressa RN,BSN;Joseph Hood RCP,RRT,BSRT;Noah Tickle, [place], Exercise Physiologist    Virtual Visit No    Medication changes reported     No    Fall or balance concerns reported    No    Warm-up and Cool-down Performed on first and last piece of equipment    Resistance Training Performed Yes    VAD Patient? No    PAD/SET Patient? No      Pain Assessment   Currently in Pain? No/denies             Social History   Tobacco Use  Smoking Status Never  Smokeless Tobacco Never    Goals Met:  Independence with exercise equipment Exercise tolerated well No report of concerns or symptoms today  Goals Unmet:  Not Applicable  Comments: Pt able to follow exercise prescription today without complaint.  Will continue to monitor for progression.    Dr. Oneil Pinal is Medical Director for [facility] Cardiac Rehabilitation.  Dr. Fuad Aleskerov is Medical Director for [facility] Pulmonary Rehabilitation.

## 2024-10-10 ENCOUNTER — Encounter: Admitting: Emergency Medicine

## 2024-10-10 DIAGNOSIS — Z955 Presence of coronary angioplasty implant and graft: Secondary | ICD-10-CM

## 2024-10-10 NOTE — Progress Notes (Signed)
 Daily Session Note  Patient Details  Name: TORRANCE STOCKLEY MRN: [id number] Date of Birth: [birth year] Referring Provider:   Flowsheet Row Cardiac Rehab from 08/31/2024 in [facility] Cardiac and Pulmonary Rehab  Referring Provider Dr. Lonni End    Encounter Date: 10/10/2024  Check In:  Session Check In - 10/10/24 1530       Check-In   Supervising physician immediately available to respond to emergencies See telemetry face sheet for immediately available ER MD    Location [facility]-Cardiac & Pulmonary Rehab    Staff Present Leita Franks RN,BSN;Joseph [facility] BS, Exercise Physiologist;Margaret Best, MS, Exercise Physiologist    Virtual Visit No    Medication changes reported     No    Fall or balance concerns reported    No    Tobacco Cessation No Change    Warm-up and Cool-down Performed on first and last piece of equipment    Resistance Training Performed Yes    VAD Patient? No    PAD/SET Patient? No      Pain Assessment   Currently in Pain? No/denies             Social History   Tobacco Use  Smoking Status Never  Smokeless Tobacco Never    Goals Met:  Independence with exercise equipment Exercise tolerated well No report of concerns or symptoms today Strength training completed today  Goals Unmet:  Not Applicable  Comments: Pt able to follow exercise prescription today without complaint.  Will continue to monitor for progression.    Dr. Oneil Pinal is Medical Director for [facility] Cardiac Rehabilitation.  Dr. Fuad Aleskerov is Medical Director for [facility] Pulmonary Rehabilitation.

## 2024-10-12 ENCOUNTER — Encounter: Admitting: Emergency Medicine

## 2024-10-12 DIAGNOSIS — Z955 Presence of coronary angioplasty implant and graft: Secondary | ICD-10-CM | POA: Diagnosis not present

## 2024-10-12 NOTE — Progress Notes (Signed)
 Daily Session Note  Patient Details  Name: James Arellano MRN: [id number] Date of Birth: 15-Jul-1961 Referring Provider:   Flowsheet Row Cardiac Rehab from 08/31/2024 in [facility] Cardiac and Pulmonary Rehab  Referring Provider Dr. Lonni End    Encounter Date: 10/12/2024  Check In:  Session Check In - 10/12/24 1546       Check-In   Supervising physician immediately available to respond to emergencies See telemetry face sheet for immediately available ER MD    Location [facility]-Cardiac & Pulmonary Rehab    Staff Present Leita Franks RN,BSN;Joseph Rolinda RCP,RRT,BSRT;Meredith Tressa RN,BSN;Kelly Bollinger [facility]    Virtual Visit No    Medication changes reported     No    Fall or balance concerns reported    No    Tobacco Cessation No Change    Warm-up and Cool-down Performed on first and last piece of equipment    Resistance Training Performed Yes    VAD Patient? No    PAD/SET Patient? No      Pain Assessment   Currently in Pain? No/denies             Social History   Tobacco Use  Smoking Status Never  Smokeless Tobacco Never    Goals Met:  Independence with exercise equipment Exercise tolerated well No report of concerns or symptoms today Strength training completed today  Goals Unmet:  Not Applicable  Comments: Pt able to follow exercise prescription today without complaint.  Will continue to monitor for progression.    Dr. Oneil Pinal is Medical Director for [facility] Cardiac Rehabilitation.  Dr. Fuad Aleskerov is Medical Director for [facility] Pulmonary Rehabilitation.

## 2024-10-13 ENCOUNTER — Encounter: Admitting: *Deleted

## 2024-10-13 DIAGNOSIS — Z955 Presence of coronary angioplasty implant and graft: Secondary | ICD-10-CM | POA: Diagnosis not present

## 2024-10-13 NOTE — Progress Notes (Signed)
 Daily Session Note  Patient Details  Name: James Arellano MRN: [id number] Date of Birth: Mar 05, 1961 Referring Provider:   Flowsheet Row Cardiac Rehab from 08/31/2024 in [facility] Cardiac and Pulmonary Rehab  Referring Provider Dr. Lonni End    Encounter Date: 10/13/2024  Check In:  Session Check In - 10/13/24 1543       Check-In   Supervising physician immediately available to respond to emergencies See telemetry face sheet for immediately available ER MD    Location [facility]-Cardiac & Pulmonary Rehab    Staff Present Hoy Rodney RN,BSN;Joseph Rolinda RCP,RRT,BSRT;Noah Tickle, [place], Exercise Physiologist;Maxon Conetta BS, Exercise Physiologist    Virtual Visit No    Medication changes reported     No    Fall or balance concerns reported    No    Warm-up and Cool-down Performed on first and last piece of equipment    Resistance Training Performed Yes    VAD Patient? No    PAD/SET Patient? No      Pain Assessment   Currently in Pain? No/denies             Tobacco Use History[1]  Goals Met:  Independence with exercise equipment Exercise tolerated well No report of concerns or symptoms today Strength training completed today  Goals Unmet:  Not Applicable  Comments: Pt able to follow exercise prescription today without complaint.  Will continue to monitor for progression.    Dr. Oneil Pinal is Medical Director for [facility] Cardiac Rehabilitation.  Dr. Fuad Aleskerov is Medical Director for [facility] Pulmonary Rehabilitation.    [1]  Social History Tobacco Use  Smoking Status Never  Smokeless Tobacco Never

## 2024-10-17 ENCOUNTER — Encounter: Admitting: Emergency Medicine

## 2024-10-17 DIAGNOSIS — Z955 Presence of coronary angioplasty implant and graft: Secondary | ICD-10-CM | POA: Diagnosis not present

## 2024-10-17 NOTE — Progress Notes (Signed)
 Daily Session Note  Patient Details  Name: James Arellano MRN: [id number] Date of Birth: 14-May-1961 Referring Provider:   Flowsheet Row Cardiac Rehab from 08/31/2024 in [facility] Cardiac and Pulmonary Rehab  Referring Provider Dr. Lonni End    Encounter Date: 10/17/2024  Check In:  Session Check In - 10/17/24 1548       Check-In   Supervising physician immediately available to respond to emergencies See telemetry face sheet for immediately available ER MD    Location [facility]-Cardiac & Pulmonary Rehab    Staff Present Leita Franks RN,BSN;Joseph [facility] BS, Exercise Physiologist;Jason Elnor RDN,LDN    Virtual Visit No    Medication changes reported     No    Fall or balance concerns reported    No    Tobacco Cessation No Change    Warm-up and Cool-down Performed on first and last piece of equipment    Resistance Training Performed Yes    VAD Patient? No    PAD/SET Patient? No      Pain Assessment   Currently in Pain? No/denies             Tobacco Use History[1]  Goals Met:  Independence with exercise equipment Exercise tolerated well No report of concerns or symptoms today Strength training completed today  Goals Unmet:  Not Applicable  Comments: Pt able to follow exercise prescription today without complaint.  Will continue to monitor for progression.    Dr. Oneil Pinal is Medical Director for [facility] Cardiac Rehabilitation.  Dr. Fuad Aleskerov is Medical Director for [facility] Pulmonary Rehabilitation.    [1]  Social History Tobacco Use  Smoking Status Never  Smokeless Tobacco Never

## 2024-10-19 ENCOUNTER — Encounter

## 2024-10-19 DIAGNOSIS — Z955 Presence of coronary angioplasty implant and graft: Secondary | ICD-10-CM

## 2024-10-19 NOTE — Progress Notes (Signed)
 Daily Session Note  Patient Details  Name: James Arellano MRN: [id number] Date of Birth: Oct 02, 1961 Referring Provider:   Flowsheet Row Cardiac Rehab from 08/31/2024 in [facility] Cardiac and Pulmonary Rehab  Referring Provider Dr. Lonni End    Encounter Date: 10/19/2024  Check In:  Session Check In - 10/19/24 1545       Check-In   Supervising physician immediately available to respond to emergencies See telemetry face sheet for immediately available ER MD    Location [facility]-Cardiac & Pulmonary Rehab    Staff Present Leita Franks RN,BSN;Joseph Rolinda RCP,RRT,BSRT;Kelly Bollinger RN,BSN,MPA;Meredith Tressa RN,BSN    Virtual Visit No    Medication changes reported     No    Fall or balance concerns reported    No    Tobacco Cessation No Change    Warm-up and Cool-down Performed on first and last piece of equipment    Resistance Training Performed Yes    VAD Patient? No    PAD/SET Patient? No      Pain Assessment   Currently in Pain? No/denies             Tobacco Use History[1]  Goals Met:  Independence with exercise equipment Exercise tolerated well No report of concerns or symptoms today Strength training completed today  Goals Unmet:  Not Applicable  Comments: Pt able to follow exercise prescription today without complaint.  Will continue to monitor for progression.    Dr. Oneil Pinal is Medical Director for [facility] Cardiac Rehabilitation.  Dr. Fuad Aleskerov is Medical Director for [facility] Pulmonary Rehabilitation.    [1]  Social History Tobacco Use  Smoking Status Never  Smokeless Tobacco Never

## 2024-10-20 ENCOUNTER — Encounter

## 2024-10-20 DIAGNOSIS — Z955 Presence of coronary angioplasty implant and graft: Secondary | ICD-10-CM

## 2024-10-20 NOTE — Progress Notes (Signed)
 Daily Session Note  Patient Details  Name: James Arellano MRN: [id number] Date of Birth: [birth year] Referring Provider:   Flowsheet Row Cardiac Rehab from 08/31/2024 in [facility] Cardiac and Pulmonary Rehab  Referring Provider Dr. Lonni End    Encounter Date: 10/20/2024  Check In:  Session Check In - 10/20/24 1548       Check-In   Supervising physician immediately available to respond to emergencies See telemetry face sheet for immediately available ER MD    Location [facility]-Cardiac & Pulmonary Rehab    Staff Present Hoy Rodney RN,BSN;Maxon Burnell BS, Exercise Physiologist;Joseph Hood RCP,RRT,BSRT;Noah Tickle, [place], Exercise Physiologist    Virtual Visit No    Medication changes reported     No    Fall or balance concerns reported    No    Warm-up and Cool-down Performed on first and last piece of equipment    Resistance Training Performed Yes    VAD Patient? No    PAD/SET Patient? No      Pain Assessment   Currently in Pain? No/denies             Tobacco Use History[1]  Goals Met:  Independence with exercise equipment Exercise tolerated well No report of concerns or symptoms today Strength training completed today  Goals Unmet:  Not Applicable  Comments: Pt able to follow exercise prescription today without complaint.  Will continue to monitor for progression.    Dr. Oneil Pinal is Medical Director for [facility] Cardiac Rehabilitation.  Dr. Fuad Aleskerov is Medical Director for [facility] Pulmonary Rehabilitation.    [1]  Social History Tobacco Use  Smoking Status Never  Smokeless Tobacco Never

## 2024-10-24 ENCOUNTER — Encounter: Admitting: Emergency Medicine

## 2024-10-24 DIAGNOSIS — Z955 Presence of coronary angioplasty implant and graft: Secondary | ICD-10-CM | POA: Diagnosis not present

## 2024-10-24 NOTE — Progress Notes (Signed)
 Daily Session Note  Patient Details  Name: James Arellano MRN: [id number] Date of Birth: 1961-05-12 Referring Provider:   Flowsheet Row Cardiac Rehab from 08/31/2024 in [facility] Cardiac and Pulmonary Rehab  Referring Provider Dr. Lonni End    Encounter Date: 10/24/2024  Check In:  Session Check In - 10/24/24 1550       Check-In   Supervising physician immediately available to respond to emergencies See telemetry face sheet for immediately available ER MD    Location [facility]-Cardiac & Pulmonary Rehab    Staff Present Leita Franks RN,BSN;Meredith Tressa RN,BSN;Margaret Best, MS, Exercise Physiologist;Kristen Coble RN,BC,MSN    Virtual Visit No    Medication changes reported     No    Fall or balance concerns reported    No    Tobacco Cessation No Change    Warm-up and Cool-down Performed on first and last piece of equipment    Resistance Training Performed Yes    VAD Patient? No    PAD/SET Patient? No      Pain Assessment   Currently in Pain? No/denies             Tobacco Use History[1]  Goals Met:  Independence with exercise equipment Exercise tolerated well No report of concerns or symptoms today Strength training completed today  Goals Unmet:  Not Applicable  Comments: Pt able to follow exercise prescription today without complaint.  Will continue to monitor for progression.    Dr. Oneil Pinal is Medical Director for [facility] Cardiac Rehabilitation.  Dr. Fuad Aleskerov is Medical Director for [facility] Pulmonary Rehabilitation.    [1]  Social History Tobacco Use  Smoking Status Never  Smokeless Tobacco Never

## 2024-10-25 ENCOUNTER — Encounter: Payer: Self-pay | Admitting: Medical

## 2024-10-25 ENCOUNTER — Ambulatory Visit: Attending: Medical | Admitting: Medical

## 2024-10-25 VITALS — BP 115/70 | HR 65 | Ht 72.0 in | Wt 181.4 lb

## 2024-10-25 DIAGNOSIS — I10 Essential (primary) hypertension: Secondary | ICD-10-CM

## 2024-10-25 DIAGNOSIS — Z79899 Other long term (current) drug therapy: Secondary | ICD-10-CM

## 2024-10-25 DIAGNOSIS — I25110 Atherosclerotic heart disease of native coronary artery with unstable angina pectoris: Secondary | ICD-10-CM

## 2024-10-25 DIAGNOSIS — E782 Mixed hyperlipidemia: Secondary | ICD-10-CM

## 2024-10-25 MED ORDER — PRALUENT 75 MG/ML ~~LOC~~ SOAJ: 75.0000 mg | SUBCUTANEOUS | 2 refills | Status: DC

## 2024-10-25 NOTE — Progress Notes (Signed)
 " Cardiology Office Note   Date:  10/25/2024  ID:  James Arellano, DOB [birth year], MRN [id number] PCP: Sharma Coyer, MD  [facility] Providers Cardiologist:  Evalene Lunger, MD   History of Present Illness James Arellano is a 63 y.o. male with a h/o depression/anxiety, HLD, family history of coronary atherosclerosis, coronary artery disease s/p DES x 2 ost-mid LAD, hypertension, suspected von Willebrand's disease who presents for follow-up of CAD.   [name] was previously seen in in clinic 07/22/2020 as a new patient for chest pain.  Coronary calcium  score was 1899, severe CAD with 70 to 99% mixed plaques in the proximal LAD, 25-49% mixed plaque in the proximal R1 segment, 1 to 24% plaque in the ostial proximal left circumflex segment, 50 to 69% calcified plaque proximal RCA.  CT FFR showed significant stenosis in the proximal LAD.  Patient was notified and cardiac catheterization was recommended.  However patient had vacation plans and cardiac medications were called in.  He was started on Toprol -XL 25 Mg daily, Imdur  30 Mg daily, Aspirin  81 Mg daily and Sublingual Nitroglycerin .  Lisinopril  was decreased to 20 Mg daily.    He was evaluated in clinic 08/08/24 reporting continued chest pain episodes improved with sublingual nitroglycerin .  Patient became hypotensive and was given IV fluids, aspirin  324 mg daily and was ultimately taken to the ER for further workup.  In the ER he denied any further chest pain.  Blood pressure 125/70, serum creatinine 1.32, hemoglobin 12.5.  CT of the chest negative for PE, 4.9 cm ascending aortic aneurysm noted.  Patient underwent cardiac cath and was treated with stenting to the ostial/mid LAD.  Stent across the LAD was placed due to proximal edge dissection after initial stent placement. Cardiac cath otherwise showed nonobstructive disease.  He was started on aspirin  and Effient  for at least 6 months. Echo showed normal EF and normal  diastolic parameters.  Patient did have intermittent chest pain during hospitalization requiring nitro.  He was started on amlodipine , Imdur , aspirin , Effient  and Praluent .  Patient was last seen October 2025 and reported occasional chest twinges.  Patient stopped Imdur  due to it headache.  Today, the patient is overall feeling well. He has been doing cardiac rehab and has not missed a session. He is running on a tread mill. He denies chest pain or SOB. He has lost at least 20lbs. He does heart health diet. He is feeling stressed over personal issues, but trying to deal with this. Blood test came our normal clopidogrel genotype.  Studies Reviewed EKG Interpretation Date/Time:  Tuesday October 25 2024 08:45:30 EST Ventricular Rate:  65 PR Interval:  168 QRS Duration:  98 QT Interval:  418 QTC Calculation: 434 R Axis:   -33  Text Interpretation: Normal sinus rhythm Left axis deviation Incomplete right bundle branch block When compared with ECG of 22-Aug-2024 13:46, No significant change was found Confirmed by Franchester, Shantanique Hodo ([id number]) on 10/25/2024 8:53:20 AM    LHC 08/2024 Conclusions: Severe single-vessel coronary artery disease with heterogeneous 80-90% proximal LAD stenosis (MLA 1.57 mm) and 50% mid LAD stenosis with component of myocardial bridging.  There is also mild-moderate, nonobstructive disease involving codominant LCx and RCA. Mildly elevated left ventricular filling pressure (LVEDP 18 mmHg). Successful OCT-guided PCI to ostial/mid LAD with overlapping Onyx Frontier 4.0 x 8 mm and 3.5 x 34 mm drug-eluting stents with 0% residual stenosis and TIMI-3 flow.  Stent across the ostium of the LAD was placed due to proximal  edge dissection after initial stent placement. Tiny nonflow limiting dissection involving the mid LMCA apparent only on OCT and not angiography, likely guide catheter induced.   Recommendations: Overnight observation. Dual antiplatelet therapy with aspirin  and  prasugrel  for at least 6 months.  Will send cytochrome P450 2C19 genotype; if the patient is an appropriate clopidogrel responder, would recommend indefinite clopidogrel therapy after completion of initial 6 months of DAPT. Gently postcatheterization hydration and close monitoring of renal function given 290 mL of contrast used for catheterization/intervention. Aggressive secondary prevention of coronary artery disease.   Lonni Hanson, MD [facility]     Echo 08/08/2024  1. Left ventricular ejection fraction, by estimation, is 55 to 60%. The  left ventricle has normal function. The left ventricle has no regional  wall motion abnormalities. Left ventricular diastolic parameters were  normal.   2. Right ventricular systolic function is normal. The right ventricular  size is normal. There is normal pulmonary artery systolic pressure.   3. The mitral valve is normal in structure. No evidence of mitral valve  regurgitation. No evidence of mitral stenosis.   4. The aortic valve is normal in structure. Aortic valve regurgitation is  mild. Aortic valve sclerosis/calcification is present, without any  evidence of aortic stenosis.   5. The inferior vena cava is dilated in size with >50% respiratory  variability, suggesting right atrial pressure of 8 mmHg.          Physical Exam VS:  BP 115/70 (BP Location: Left Arm, Patient Position: Sitting, Cuff Size: Normal)   Pulse 65 Comment: 67 oximeter  Ht 6' (1.829 m)   Wt 181 lb 6.4 oz (82.3 kg)   SpO2 99%   BMI 24.60 kg/m        Wt Readings from Last 3 Encounters:  10/25/24 181 lb 6.4 oz (82.3 kg)  09/19/24 192 lb 1.6 oz (87.1 kg)  08/31/24 199 lb 4.8 oz (90.4 kg)    GEN: Well nourished, well developed in no acute distress NECK: No JVD; No carotid bruits CARDIAC: RRR, no murmurs, rubs, gallops RESPIRATORY:  Clear to auscultation without rales, wheezing or rhonchi  ABDOMEN: Soft, non-tender, non-distended EXTREMITIES:  No edema; No  deformity   ASSESSMENT AND PLAN  CAD s/p PCI/DES x 2 ost/mid LAD Patient is overall doing well from a cardiac perspective. He is doing cardiac rehab and this is going well. He has lost about 20lbs with diet and exercise. He denies anginal issues. Continue DAPT with ASA and Effient . Continue statin, Zetia , SL NTG.   HLD LDL 156. 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 Continue Lipitor  80mg  daily and Zetia  10mg  daily. Patient was taking Praluent  in the past, he said he had 2 shots, then is not sure what happened after this. We will send in another prescription. Re-check lipid panel today.  HTN Blood pressure is 115/70. Continue amlodipine  5mg  daily and lisinopril  40mg  daily.     Dispo: follow-up in 3 months  Signed, Lashon Beringer VEAR Fishman, PA-C   "

## 2024-10-25 NOTE — Patient Instructions (Signed)
 Medication Instructions:  Your physician recommends the following medication changes.  RESTART TAKING: Praluent  75 mg injected into the skin every 14 days   *If you need a refill on your cardiac medications before your next appointment, please call your pharmacy*  Lab Work: Your provider would like for you to have following labs drawn today Lipid, direct LDL.     Testing/Procedures: No test ordered today   Follow-Up: At [facility], you and your health needs are our priority.  As part of our continuing mission to provide you with exceptional heart care, our providers are all part of one team.  This team includes your primary Cardiologist (physician) and Advanced Practice Providers or APPs (Physician Assistants and Nurse Practitioners) who all work together to provide you with the care you need, when you need it.  Your next appointment:   3 month(s)  Provider:   Timothy Gollan, MD or Cadence Franchester, PA-C

## 2024-10-26 ENCOUNTER — Telehealth: Payer: Self-pay

## 2024-10-26 ENCOUNTER — Other Ambulatory Visit (HOSPITAL_COMMUNITY): Payer: Self-pay

## 2024-10-26 ENCOUNTER — Ambulatory Visit: Payer: Self-pay | Admitting: Medical

## 2024-10-26 LAB — LIPID PANEL
Chol/HDL Ratio: 2.1 ratio (ref 0.0–5.0)
Cholesterol, Total: 122 mg/dL (ref 100–199)
HDL: 59 mg/dL
LDL Chol Calc (NIH): 51 mg/dL (ref 0–99)
Triglycerides: 54 mg/dL (ref 0–149)
VLDL Cholesterol Cal: 12 mg/dL (ref 5–40)

## 2024-10-26 LAB — LDL CHOLESTEROL, DIRECT: LDL Direct: 54 mg/dL (ref 0–99)

## 2024-10-26 NOTE — Telephone Encounter (Signed)
 Pharmacy Patient Advocate Encounter   Received notification from Fax that prior authorization for PRALUENT  is required/requested.   Insurance verification completed.   The patient is insured through [facility]   Per test claim:  REPATHA  is preferred by the insurance.  If suggested medication is appropriate, Please send in a new RX and discontinue this one. If not, please advise as to why it's not appropriate so that we may request a Prior Authorization. Please note, some preferred medications may still require a PA.  If the suggested medications have not been trialed and there are no contraindications to their use, the PA will not be submitted, as it will not be approved.

## 2024-10-28 ENCOUNTER — Other Ambulatory Visit (HOSPITAL_COMMUNITY): Payer: Self-pay

## 2024-10-28 ENCOUNTER — Other Ambulatory Visit: Payer: Self-pay | Admitting: Medical

## 2024-10-28 MED ORDER — REPATHA SURECLICK 140 MG/ML ~~LOC~~ SOAJ
140.0000 mg | SUBCUTANEOUS | 3 refills | Status: DC
  Filled 2024-10-28: qty 2, 28d supply, fill #0

## 2024-10-28 NOTE — Telephone Encounter (Signed)
 Pt made aware and verbalized understanding. Repatha  Rx sent to pharmacy

## 2024-10-31 ENCOUNTER — Other Ambulatory Visit (HOSPITAL_COMMUNITY): Payer: Self-pay

## 2024-10-31 ENCOUNTER — Encounter: Admitting: *Deleted

## 2024-10-31 DIAGNOSIS — Z955 Presence of coronary angioplasty implant and graft: Secondary | ICD-10-CM

## 2024-10-31 NOTE — Progress Notes (Signed)
 Daily Session Note  Patient Details  Name: James Arellano MRN: [id number] Date of Birth: Jan 16, 1961 Referring Provider:   Flowsheet Row Cardiac Rehab from 08/31/2024 in [facility] Cardiac and Pulmonary Rehab  Referring Provider Dr. Lonni End    Encounter Date: 10/31/2024  Check In:  Session Check In - 10/31/24 1538       Check-In   Supervising physician immediately available to respond to emergencies See telemetry face sheet for immediately available ER MD    Location [facility]-Cardiac & Pulmonary Rehab    Staff Present Leita Franks RN,BSN;Payslie Mccaig Jacques RN,BSN;Joseph Rolinda RCP,RRT,BSRT;Jason Elnor RDN,LDN    Virtual Visit No    Medication changes reported     No    Fall or balance concerns reported    No    Warm-up and Cool-down Performed on first and last piece of equipment    Resistance Training Performed Yes    VAD Patient? No    PAD/SET Patient? No      Pain Assessment   Currently in Pain? No/denies             Tobacco Use History[1]  Goals Met:  Independence with exercise equipment Exercise tolerated well No report of concerns or symptoms today Strength training completed today  Goals Unmet:  Not Applicable  Comments: Pt able to follow exercise prescription today without complaint.  Will continue to monitor for progression.    Dr. Oneil Pinal is Medical Director for [facility] Cardiac Rehabilitation.  Dr. Fuad Aleskerov is Medical Director for [facility] Pulmonary Rehabilitation.    [1]  Social History Tobacco Use  Smoking Status Never  Smokeless Tobacco Never

## 2024-10-31 NOTE — Telephone Encounter (Signed)
 Pharmacy Patient Advocate Encounter   Received notification from Physician's Office that prior authorization for REPATHA  is required/requested.   Insurance verification completed.   The patient is insured through [facility].   Per test claim: PA required; PA submitted to above mentioned insurance via Latent Key/confirmation #/EOC [id number] Status is pending

## 2024-11-01 NOTE — Telephone Encounter (Signed)
 PA CANCELLED BY PLAN, DRUG SHOULD BE COVERED

## 2024-11-02 ENCOUNTER — Other Ambulatory Visit (HOSPITAL_COMMUNITY): Payer: Self-pay

## 2024-11-02 ENCOUNTER — Encounter

## 2024-11-02 DIAGNOSIS — Z955 Presence of coronary angioplasty implant and graft: Secondary | ICD-10-CM

## 2024-11-02 MED ORDER — REPATHA SURECLICK 140 MG/ML ~~LOC~~ SOAJ: 140.0000 mg | SUBCUTANEOUS | 3 refills | Status: AC

## 2024-11-02 NOTE — Progress Notes (Signed)
 Daily Session Note  Patient Details  Name: James Arellano MRN: [id number] Date of Birth: 12/06/1960 Referring Provider:   Flowsheet Row Cardiac Rehab from 08/31/2024 in [facility] Cardiac and Pulmonary Rehab  Referring Provider Dr. Lonni End    Encounter Date: 11/02/2024  Check In:  Session Check In - 11/02/24 1533       Check-In   Supervising physician immediately available to respond to emergencies See telemetry face sheet for immediately available ER MD    Location [facility]-Cardiac & Pulmonary Rehab    Staff Present Burnard Davenport RN,BSN,MPA;Laura Cates RN,BSN;Joseph Rolinda NORWOOD HARMAN Cecilie Delores, [place], RRT, CPFT    Virtual Visit No    Medication changes reported     No    Fall or balance concerns reported    No    Tobacco Cessation No Change    Warm-up and Cool-down Performed on first and last piece of equipment    Resistance Training Performed Yes    VAD Patient? No    PAD/SET Patient? No      Pain Assessment   Currently in Pain? No/denies             Tobacco Use History[1]  Goals Met:  Proper associated with RPD/PD & O2 Sat Independence with exercise equipment Exercise tolerated well No report of concerns or symptoms today Strength training completed today  Goals Unmet:  Not Applicable  Comments: Pt able to follow exercise prescription today without complaint.  Will continue to monitor for progression.    Dr. Oneil Pinal is Medical Director for [facility] Cardiac Rehabilitation.  Dr. Fuad Aleskerov is Medical Director for [facility] Pulmonary Rehabilitation.    [1]  Social History Tobacco Use  Smoking Status Never  Smokeless Tobacco Never

## 2024-11-07 ENCOUNTER — Encounter: Attending: Internal Medicine | Admitting: Emergency Medicine

## 2024-11-07 DIAGNOSIS — Z955 Presence of coronary angioplasty implant and graft: Secondary | ICD-10-CM | POA: Insufficient documentation

## 2024-11-07 DIAGNOSIS — Z48812 Encounter for surgical aftercare following surgery on the circulatory system: Secondary | ICD-10-CM | POA: Insufficient documentation

## 2024-11-07 NOTE — Progress Notes (Signed)
 Daily Session Note  Patient Details  Name: James Arellano MRN: [id number] Date of Birth: [birth year] Referring Provider:   Flowsheet Row Cardiac Rehab from 08/31/2024 in [facility] Cardiac and Pulmonary Rehab  Referring Provider Dr. Lonni End    Encounter Date: 11/07/2024  Check In:  Session Check In - 11/07/24 1521       Check-In   Supervising physician immediately available to respond to emergencies See telemetry face sheet for immediately available ER MD    Location [facility]-Cardiac & Pulmonary Rehab    Staff Present Leita Franks RN,BSN;Joseph [facility] BS, Exercise Physiologist;Margaret Best, MS, Exercise Physiologist    Virtual Visit No    Medication changes reported     No    Fall or balance concerns reported    No    Tobacco Cessation No Change    Warm-up and Cool-down Performed on first and last piece of equipment    Resistance Training Performed Yes    VAD Patient? No    PAD/SET Patient? No      Pain Assessment   Currently in Pain? No/denies             Tobacco Use History[1]  Goals Met:  Independence with exercise equipment Exercise tolerated well No report of concerns or symptoms today Strength training completed today  Goals Unmet:  Not Applicable  Comments: Pt able to follow exercise prescription today without complaint.  Will continue to monitor for progression.    Dr. Oneil Pinal is Medical Director for [facility] Cardiac Rehabilitation.  Dr. Fuad Aleskerov is Medical Director for [facility] Pulmonary Rehabilitation.    [1]  Social History Tobacco Use  Smoking Status Never  Smokeless Tobacco Never

## 2024-11-09 ENCOUNTER — Other Ambulatory Visit (HOSPITAL_COMMUNITY): Payer: Self-pay

## 2024-11-09 ENCOUNTER — Encounter

## 2024-11-09 DIAGNOSIS — Z955 Presence of coronary angioplasty implant and graft: Secondary | ICD-10-CM

## 2024-11-09 NOTE — Progress Notes (Signed)
 Daily Session Note  Patient Details  Name: James Arellano MRN: [id number] Date of Birth: 03/28/1961 Referring Provider:   Flowsheet Row Cardiac Rehab from 08/31/2024 in [facility] Cardiac and Pulmonary Rehab  Referring Provider Dr. Lonni End    Encounter Date: 11/09/2024  Check In:  Session Check In - 11/09/24 1534       Check-In   Supervising physician immediately available to respond to emergencies See telemetry face sheet for immediately available ER MD    Location [facility]-Cardiac & Pulmonary Rehab    Staff Present Burnard Davenport RN,BSN,MPA;Laura Cates RN,BSN;Joseph [facility] BS, ACSM CEP, Exercise Physiologist;Jason Elnor RDN,LDN    Virtual Visit No    Medication changes reported     No    Fall or balance concerns reported    No    Tobacco Cessation No Change    Warm-up and Cool-down Performed on first and last piece of equipment    Resistance Training Performed Yes    VAD Patient? No    PAD/SET Patient? No      Pain Assessment   Currently in Pain? No/denies             Tobacco Use History[1]  Goals Met:  Independence with exercise equipment Exercise tolerated well No report of concerns or symptoms today Strength training completed today  Goals Unmet:  Not Applicable  Comments: Pt able to follow exercise prescription today without complaint.  Will continue to monitor for progression.    Dr. Oneil Pinal is Medical Director for [facility] Cardiac Rehabilitation.  Dr. Fuad Aleskerov is Medical Director for [facility] Pulmonary Rehabilitation.    [1]  Social History Tobacco Use  Smoking Status Never  Smokeless Tobacco Never

## 2024-11-10 ENCOUNTER — Encounter: Admitting: *Deleted

## 2024-11-10 VITALS — Ht 72.1 in | Wt 182.6 lb

## 2024-11-10 DIAGNOSIS — Z955 Presence of coronary angioplasty implant and graft: Secondary | ICD-10-CM

## 2024-11-10 NOTE — Progress Notes (Signed)
 Daily Session Note  Patient Details  Name: James Arellano MRN: [id number] Date of Birth: 1961/04/15 Referring Provider:   Flowsheet Row Cardiac Rehab from 08/31/2024 in [facility] Cardiac and Pulmonary Rehab  Referring Provider Dr. Lonni End    Encounter Date: 11/10/2024  Check In:  Session Check In - 11/10/24 1550       Check-In   Supervising physician immediately available to respond to emergencies See telemetry face sheet for immediately available ER MD    Location [facility]-Cardiac & Pulmonary Rehab    Staff Present Othel Durand, RN, BSN, CCRP;Joseph Hood RCP,RRT,BSRT;Meredith Craven RN,BSN;Noah [place], [place], Exercise Physiologist    Virtual Visit No    Medication changes reported     No    Fall or balance concerns reported    No    Warm-up and Cool-down Performed on first and last piece of equipment    Resistance Training Performed Yes    VAD Patient? No    PAD/SET Patient? No      Pain Assessment   Currently in Pain? No/denies           6 Minute Walk     Row Name 08/31/24 1515 11/10/24 1555       6 Minute Walk   Phase Initial Discharge    Distance 1350 feet 1785 feet    Distance % Change -- 32.2 %    Distance Feet Change -- 435 ft    Walk Time 6 minutes 6 minutes    # of Rest Breaks 0 0    MPH 2.56 3.38    METS 3.5 4.38    RPE 10 9    Perceived Dyspnea  0 0    VO2 Peak 12.3 15.33    Symptoms No No    Resting HR 74 bpm 66 bpm    Resting BP 116/72 102/62    Resting Oxygen Saturation  99 % 98 %    Exercise Oxygen Saturation  during 6 min walk 99 % 96 %    Max Ex. HR 105 bpm 100 bpm    Max Ex. BP 122/82 122/64    2 Minute Post BP 114/78 --          Tobacco Use History[1]  Goals Met:  Independence with exercise equipment Exercise tolerated well No report of concerns or symptoms today  Goals Unmet:  Not Applicable  Comments: Pt able to follow exercise prescription today without complaint.  Will continue to monitor for progression.    Dr. Oneil Pinal is Medical Director for [facility] Cardiac Rehabilitation.  Dr. Fuad Aleskerov is Medical Director for [facility] Pulmonary Rehabilitation.     [1]  Social History Tobacco Use  Smoking Status Never  Smokeless Tobacco Never

## 2024-11-10 NOTE — Patient Instructions (Signed)
 Discharge Patient Instructions  Patient Details  Name: James Arellano MRN: [id number] Date of Birth: 1961/08/01 Referring Provider:  Sharma Coyer, MD   Number of Visits: 22  Reason for Discharge:  Patient reached a stable level of exercise. Patient independent in their exercise. Patient has met program and personal goals.  Diagnosis:  Status post coronary artery stent placement  Initial Exercise Prescription:  Initial Exercise Prescription - 08/31/24 1500       Date of Initial Exercise RX and Referring Provider   Date 08/31/24    Referring Provider Dr. Lonni End      Oxygen   Maintain Oxygen Saturation 88% or higher      Treadmill   MPH 2.6    Grade 1    Minutes 15    METs 3.35      Elliptical   Level 1    Speed 3    Minutes 15    METs 3.5      Rower   Level 10    Watts 50    Minutes 15    METs 3.5      Prescription Details   Duration Progress to 30 minutes of continuous aerobic without signs/symptoms of physical distress      Intensity   THRR 40-80% of Max Heartrate 107-141    Ratings of Perceived Exertion 11-13    Perceived Dyspnea 0-4      Progression   Progression Continue to progress workloads to maintain intensity without signs/symptoms of physical distress.      Resistance Training   Training Prescription Yes    Weight 10lb    Reps 10-15          Discharge Exercise Prescription (Final Exercise Prescription Changes):  Exercise Prescription Changes - 11/07/24 1500       Home Exercise Plan   Plans to continue exercise at Home (comment)   [name] plans to continue to use his treadmill and elliptical that he has at home.   Frequency Add 3 additional days to program exercise sessions.    Initial Home Exercises Provided 11/07/24          Functional Capacity:  6 Minute Walk     Row Name 08/31/24 1515 11/10/24 1555       6 Minute Walk   Phase Initial Discharge    Distance 1350 feet 1785 feet    Distance %  Change -- 32.2 %    Distance Feet Change -- 435 ft    Walk Time 6 minutes 6 minutes    # of Rest Breaks 0 0    MPH 2.56 3.38    METS 3.5 4.38    RPE 10 9    Perceived Dyspnea  0 0    VO2 Peak 12.3 15.33    Symptoms No No    Resting HR 74 bpm 66 bpm    Resting BP 116/72 102/62    Resting Oxygen Saturation  99 % 98 %    Exercise Oxygen Saturation  during 6 min walk 99 % 96 %    Max Ex. HR 105 bpm 100 bpm    Max Ex. BP 122/82 122/64    2 Minute Post BP 114/78 --      Nutrition & Weight - Outcomes:  Pre Biometrics - 08/31/24 1518       Pre Biometrics   Height 6' 0.1 (1.831 m)    Weight 199 lb 4.8 oz (90.4 kg)    Waist Circumference 36 inches  Hip Circumference 40.5 inches    Waist to Hip Ratio 0.89 %    BMI (Calculated) 26.97    Single Leg Stand 11.3 seconds          Post Biometrics - 11/10/24 1558        Post  Biometrics   Height 6' 0.1 (1.831 m)    Weight 182 lb 9.6 oz (82.8 kg)    Waist Circumference 35 inches    Hip Circumference 38.5 inches    Waist to Hip Ratio 0.91 %    BMI (Calculated) 24.71    Single Leg Stand 30 seconds

## 2024-11-14 ENCOUNTER — Encounter: Admitting: Emergency Medicine

## 2024-11-14 ENCOUNTER — Encounter: Payer: Self-pay | Admitting: Cardiovascular Disease

## 2024-11-14 DIAGNOSIS — Z955 Presence of coronary angioplasty implant and graft: Secondary | ICD-10-CM

## 2024-11-14 NOTE — Progress Notes (Signed)
 Daily Session Note  Patient Details  Name: James Arellano MRN: [id number] Date of Birth: 1961-01-02 Referring Provider:   Flowsheet Row Cardiac Rehab from 08/31/2024 in [facility] Cardiac [facility] Pul[facility] Re[facility]  Referring Provider Dr. Lonni End    Encounter Date: 11/14/2024  Check In:  Session Check In - 11/14/24 1527       Check-In   Supervising physician immediately available to respond to emergencies See telemetry face sheet for immediately available ER MD    Location [facility]-Cardiac & Pulmonary Rehab    Staff Present Leita Franks RN,BSN;[facility] BS, Exercise Physiologist;Margaret Best, MS, Exercise Physiologist    Virtual Visit No    Medication changes reported     No    Fall or balance concerns reported    No    Tobacco Cessation No Change    Warm-up and Cool-down Performed on first and last piece of equipment    Resistance Training Performed Yes    VAD Patient? No    PAD/SET Patient? No      Pain Assessment   Currently in Pain? No/denies             Tobacco Use History[1]  Goals Met:  Independence with exercise equipment Exercise tolerated well No report of concerns or symptoms today Strength training completed today  Goals Unmet:  Not Applicable  Comments: Pt able to follow exercise prescription today without complaint.  Will continue to monitor for progression.    Dr. Oneil Pinal is Medical Director for [facility] Cardiac Rehabilitation.  Dr. Fuad Aleskerov is Medical Director for [facility] Pulmonary Rehabilitation.    [1]  Social History Tobacco Use  Smoking Status Never  Smokeless Tobacco Never

## 2024-11-16 ENCOUNTER — Encounter: Payer: Self-pay | Admitting: *Deleted

## 2024-11-16 ENCOUNTER — Encounter

## 2024-11-16 DIAGNOSIS — Z955 Presence of coronary angioplasty implant and graft: Secondary | ICD-10-CM

## 2024-11-16 NOTE — Progress Notes (Signed)
 Cardiac Individual Treatment Plan  Patient Details  Name: James Arellano MRN: [id number] Date of Birth: 06-Jul-1961 Referring Provider:   Flowsheet Row Cardiac Rehab from 08/31/2024 in [facility] Cardiac and Pulmonary Rehab  Referring Provider Dr. Lonni End    Initial Encounter Date:  Flowsheet Row Cardiac Rehab from 08/31/2024 in [facility] Cardiac and Pulmonary Rehab  Date 08/31/24    Visit Diagnosis: Status post coronary artery stent placement  Patient's Home Medications on Admission: Current Medications[1]  Past Medical History: Past Medical History:  Diagnosis Date   CAD (coronary artery disease) 08/09/2024   Depression    GERD (gastroesophageal reflux disease)    Hypertension About 10 yrs    Tobacco Use: Tobacco Use History[2]  Labs: Review Flowsheet  More data exists      Latest Ref Rng & Units 02/23/2020 09/25/2021 08/08/2024 08/09/2024 10/25/2024  Labs for ITP Cardiac and Pulmonary Rehab  Cholestrol 100 - 199 mg/dL 794  757  - 782  877   LDL (calc) 0 - 99 mg/dL 858  824  - 843  51   Direct LDL 0 - 99 mg/dL - - - - 54   HDL-C >60 mg/dL 51  54  - 51  59   Trlycerides 0 - 149 mg/dL 74  76  - 49  54   Hemoglobin A1c 4.8 - 5.6 % - - 4.8  - -     Exercise Target Goals: Exercise Program Goal: Individual exercise prescription set using results from initial 6 min walk test and THRR while considering  patients activity barriers and safety.   Exercise Prescription Goal: Initial exercise prescription builds to 30-45 minutes a day of aerobic activity, 2-3 days per week.  Home exercise guidelines will be given to patient during program as part of exercise prescription that the participant will acknowledge.   Education: Aerobic Exercise: - Group verbal and visual presentation on the components of exercise prescription. Introduces F.I.T.T principle from ACSM for exercise prescriptions.  Reviews F.I.T.T. principles of aerobic exercise including progression. Written material  provided at class time. Flowsheet Row Cardiac Rehab from 11/09/2024 in [facility] Cardiac and Pulmonary Rehab  Education need identified 08/31/24    Education: Resistance Exercise: - Group verbal and visual presentation on the components of exercise prescription. Introduces F.I.T.T principle from ACSM for exercise prescriptions  Reviews F.I.T.T. principles of resistance exercise including progression. Written material provided at class time.    Education: Exercise & Equipment Safety: - Individual verbal instruction and demonstration of equipment use and safety with use of the equipment. Flowsheet Row Cardiac Rehab from 11/09/2024 in [facility] Cardiac and Pulmonary Rehab  Date 08/31/24  Educator [facility]  Instruction Review Code 1- Verbalizes Understanding    Education: Exercise Physiology & General Exercise Guidelines: - Group verbal and written instruction with models to review the exercise physiology of the cardiovascular system and associated critical values. Provides general exercise guidelines with specific guidelines to those with heart or lung disease. Written material provided at class time. Flowsheet Row Cardiac Rehab from 11/09/2024 in [facility] Cardiac and Pulmonary Rehab  Education need identified 08/31/24    Education: Flexibility, Balance, Mind/Body Relaxation: - Group verbal and visual presentation with interactive activity on the components of exercise prescription. Introduces F.I.T.T principle from ACSM for exercise prescriptions. Reviews F.I.T.T. principles of flexibility and balance exercise training including progression. Also discusses the mind body connection.  Reviews various relaxation techniques to help reduce and manage stress (i.e. Deep breathing, progressive muscle relaxation, and visualization). Balance handout provided to  take home. Written material provided at class time.   Activity Barriers & Risk Stratification:  Activity Barriers & Cardiac Risk Stratification - 08/31/24 1516        Activity Barriers & Cardiac Risk Stratification   Activity Barriers Other (comment)    Comments ruptured right bicep tendon; right rotator cuff tear    Cardiac Risk Stratification Moderate          6 Minute Walk:  6 Minute Walk     Row Name 08/31/24 1515 11/10/24 1555       6 Minute Walk   Phase Initial Discharge    Distance 1350 feet 1785 feet    Distance % Change -- 32.2 %    Distance Feet Change -- 435 ft    Walk Time 6 minutes 6 minutes    # of Rest Breaks 0 0    MPH 2.56 3.38    METS 3.5 4.38    RPE 10 9    Perceived Dyspnea  0 0    VO2 Peak 12.3 15.33    Symptoms No No    Resting HR 74 bpm 66 bpm    Resting BP 116/72 102/62    Resting Oxygen Saturation  99 % 98 %    Exercise Oxygen Saturation  during 6 min walk 99 % 96 %    Max Ex. HR 105 bpm 100 bpm    Max Ex. BP 122/82 122/64    2 Minute Post BP 114/78 --       Oxygen Initial Assessment:   Oxygen Re-Evaluation:   Oxygen Discharge (Final Oxygen Re-Evaluation):   Initial Exercise Prescription:  Initial Exercise Prescription - 08/31/24 1500       Date of Initial Exercise RX and Referring Provider   Date 08/31/24    Referring Provider Dr. Lonni End      Oxygen   Maintain Oxygen Saturation 88% or higher      Treadmill   MPH 2.6    Grade 1    Minutes 15    METs 3.35      Elliptical   Level 1    Speed 3    Minutes 15    METs 3.5      Rower   Level 10    Watts 50    Minutes 15    METs 3.5      Prescription Details   Duration Progress to 30 minutes of continuous aerobic without signs/symptoms of physical distress      Intensity   THRR 40-80% of Max Heartrate 107-141    Ratings of Perceived Exertion 11-13    Perceived Dyspnea 0-4      Progression   Progression Continue to progress workloads to maintain intensity without signs/symptoms of physical distress.      Resistance Training   Training Prescription Yes    Weight 10lb    Reps 10-15          Perform Capillary  Blood Glucose checks as needed.  Exercise Prescription Changes:   Exercise Prescription Changes     Row Name 08/31/24 1500 09/15/24 1500 09/27/24 1500 10/12/24 1100 10/31/24 1300     Response to Exercise   Blood Pressure (Admit) 116/72 108/56 106/64 118/64 94/52   Blood Pressure (Exercise) 122/82 140/80 142/60 140/74 --   Blood Pressure (Exit) 114/78 100/62 108/58 102/56 98/56   Heart Rate (Admit) 74 bpm 72 bpm 79 bpm 89 bpm 67 bpm   Heart Rate (Exercise) 105 bpm 129 bpm 140 bpm  141 bpm 153 bpm   Heart Rate (Exit) 80 bpm 104 bpm 91 bpm 87 bpm 95 bpm   Oxygen Saturation (Admit) 99 % -- -- -- --   Oxygen Saturation (Exercise) 99 % -- -- -- --   Oxygen Saturation (Exit) 99 % -- -- -- --   Rating of Perceived Exertion (Exercise) 10 13 15 16 16    Perceived Dyspnea (Exercise) 0 0 -- -- --   Symptoms none none none none none   Comments results 1st week of exercise -- -- --   Duration -- Progress to 30 minutes of  aerobic without signs/symptoms of physical distress Progress to 30 minutes of  aerobic without signs/symptoms of physical distress Progress to 30 minutes of  aerobic without signs/symptoms of physical distress Progress to 30 minutes of  aerobic without signs/symptoms of physical distress   Intensity -- THRR unchanged THRR unchanged THRR unchanged THRR unchanged     Progression   Progression -- Continue to progress workloads to maintain intensity without signs/symptoms of physical distress. Continue to progress workloads to maintain intensity without signs/symptoms of physical distress. Continue to progress workloads to maintain intensity without signs/symptoms of physical distress. Continue to progress workloads to maintain intensity without signs/symptoms of physical distress.   Average METs -- 3.4 4.18 4.72 4.8     Resistance Training   Training Prescription -- Yes Yes Yes --   Weight -- 10lb 10lb 10lb 10lb   Reps -- 10-15 10-15 10-15 10-15     Interval Training   Interval  Training -- No No No No     Treadmill   MPH -- 3.2 4 4 4    Grade -- 1 1.5 2 2    Minutes -- 15 15 15 15    METs -- 3.89 4.89 5.17 5.17     Elliptical   Level -- 3 5 7 12    Speed -- 3 2 2 2    Minutes -- 15 15 15 15    METs -- 2.8 5 4.6 --     Oxygen   Maintain Oxygen Saturation -- 88% or higher 88% or higher 88% or higher 88% or higher    Row Name 11/07/24 1500 11/14/24 1100           Response to Exercise   Blood Pressure (Admit) -- 108/68      Blood Pressure (Exit) -- 102/60      Heart Rate (Admit) -- 85 bpm      Heart Rate (Exercise) -- 135 bpm      Heart Rate (Exit) -- 94 bpm      Oxygen Saturation (Admit) -- 98 %      Oxygen Saturation (Exercise) -- 96 %      Oxygen Saturation (Exit) -- 98 %      Rating of Perceived Exertion (Exercise) -- 15.3      Symptoms -- none      Duration -- Progress to 30 minutes of  aerobic without signs/symptoms of physical distress      Intensity -- THRR unchanged        Progression   Progression -- Continue to progress workloads to maintain intensity without signs/symptoms of physical distress.      Average METs -- 4.97        Resistance Training   Weight -- 10lb      Reps -- 10-15        Interval Training   Interval Training -- No        Treadmill  MPH -- 4      Grade -- 2      Minutes -- 15      METs -- 5.17        Elliptical   Level -- 15      Speed -- 4      Minutes -- 15      METs -- 6.6        Home Exercise Plan   Plans to continue exercise at Home (comment)  [name] plans to continue to use his treadmill and elliptical that he has at home. Home (comment)  [name] plans to continue to use his treadmill and elliptical that he has at home.      Frequency Add 3 additional days to program exercise sessions. Add 3 additional days to program exercise sessions.      Initial Home Exercises Provided 11/07/24 11/07/24        Oxygen   Maintain Oxygen Saturation -- 88% or higher         Exercise Comments:   Exercise  Comments     Row Name 09/05/24 1538           Exercise Comments First full day of exercise!  Patient was oriented to gym and equipment including functions, settings, policies, and procedures.  Patient's individual exercise prescription and treatment plan were reviewed.  All starting workloads were established based on the results of the 6 minute walk test done at initial orientation visit.  The plan for exercise progression was also introduced and progression will be customized based on patient's performance and goals.          Exercise Goals and Review:   Exercise Goals     Row Name 08/31/24 1518             Exercise Goals   Increase Physical Activity Yes       Intervention Develop an individualized exercise prescription for aerobic and resistive training based on initial evaluation findings, risk stratification, comorbidities and participant's personal goals.;Provide advice, education, support and counseling about physical activity/exercise needs.       Expected Outcomes Short Term: Attend rehab on a regular basis to increase amount of physical activity.;Long Term: Add in home exercise to make exercise part of routine and to increase amount of physical activity.;Long Term: Exercising regularly at least 3-5 days a week.       Increase Strength and Stamina Yes       Intervention Provide advice, education, support and counseling about physical activity/exercise needs.;Develop an individualized exercise prescription for aerobic and resistive training based on initial evaluation findings, risk stratification, comorbidities and participant's personal goals.       Expected Outcomes Short Term: Increase workloads from initial exercise prescription for resistance, speed, and METs.;Long Term: Improve cardiorespiratory fitness, muscular endurance and strength as measured by increased METs and functional capacity ( );Short Term: Perform resistance training exercises routinely during rehab and add  in resistance training at home       Able to understand and use rate of perceived exertion (RPE) scale Yes       Intervention Provide education and explanation on how to use RPE scale       Expected Outcomes Short Term: Able to use RPE daily in rehab to express subjective intensity level;Long Term:  Able to use RPE to guide intensity level when exercising independently       Able to understand and use Dyspnea scale Yes       Intervention Provide education  and explanation on how to use Dyspnea scale       Expected Outcomes Long Term: Able to use Dyspnea scale to guide intensity level when exercising independently;Short Term: Able to use Dyspnea scale daily in rehab to express subjective sense of shortness of breath during exertion       Knowledge and understanding of Target Heart Rate Range (THRR) Yes       Intervention Provide education and explanation of THRR including how the numbers were predicted and where they are located for reference       Expected Outcomes Short Term: Able to state/look up THRR;Short Term: Able to use daily as guideline for intensity in rehab;Long Term: Able to use THRR to govern intensity when exercising independently       Able to check pulse independently Yes       Intervention Provide education and demonstration on how to check pulse in carotid and radial arteries.;Review the importance of being able to check your own pulse for safety during independent exercise       Expected Outcomes Short Term: Able to explain why pulse checking is important during independent exercise;Long Term: Able to check pulse independently and accurately       Understanding of Exercise Prescription Yes       Intervention Provide education, explanation, and written materials on patient's individual exercise prescription       Expected Outcomes Short Term: Able to explain program exercise prescription;Long Term: Able to explain home exercise prescription to exercise independently           Exercise Goals Re-Evaluation :  Exercise Goals Re-Evaluation     Row Name 09/05/24 1538 09/15/24 1506 09/27/24 1521 10/12/24 1131 10/31/24 1359     Exercise Goal Re-Evaluation   Exercise Goals Review Increase Physical Activity;Understanding of Exercise Prescription;Knowledge and understanding of Target Heart Rate Range (THRR);Able to understand and use rate of perceived exertion (RPE) scale;Increase Strength and Stamina;Able to understand and use Dyspnea scale;Able to check pulse independently Increase Physical Activity;Understanding of Exercise Prescription;Increase Strength and Stamina Increase Physical Activity;Understanding of Exercise Prescription;Increase Strength and Stamina Increase Physical Activity;Understanding of Exercise Prescription;Increase Strength and Stamina Increase Physical Activity;Understanding of Exercise Prescription;Increase Strength and Stamina   Comments Reviewed RPE and dyspnea scale, THR and program prescription with pt today.  Pt voiced understanding and was given a copy of goals to take home. Sun is off to a good start in the program. He completed his first week of exercise in this review. He was able to work at a speed of 3.2 mph on the treadmill with 1% incline. He also worked at level 3 on the elliptical. We will continue to monitor his progress in the program. Drae is doing well in rehab. He was able to increase his workload on the treadmill to a speed of 4 mph and incline of 1.5%. He also increased to level 5 on the elliptical. We will continue to monitor his progress in the program. Humphrey is doing well in rehab. He increased his incline on the treadmill to 2% while maintaining a speed of 4 mph.. He also increased to level 7 on the elliptical. We will continue to monitor his progress in the program. Austyn continues to do well in rehab. He was able to increase from level 7 to 12 on the elliptical. He was also able to maintain his workload on the treadmill at  a speed of and 2% incline. We will continue to monitor his progress in the program.  Expected Outcomes Short: Use RPE daily to regulate intensity.  Long: Follow program prescription in THR. Short: Continue to follow current exercise prescription. Long: Continue exercise to improve strength and stamina. Short: Continue to progressively increase treadmill and elliptical workloads. Long: Continue exercise to improve strength and stamina. Short: Continue to progressively increase treadmill incline and elliptical workloads. Long: Continue exercise to improve strength and stamina. Short: Continue to increase elliptical workload. Long: Continue exercise to improve strength and stamina.    Row Name 11/07/24 1546 11/14/24 1136           Exercise Goal Re-Evaluation   Exercise Goals Review Understanding of Exercise Prescription;Able to understand and use Dyspnea scale;Knowledge and understanding of Target Heart Rate Range (THRR);Increase Strength and Stamina;Able to understand and use rate of perceived exertion (RPE) scale;Able to check pulse independently Increase Physical Activity;Increase Strength and Stamina;Understanding of Exercise Prescription      Comments Reviewed home exercise with pt today.  Pt plans to use his treadmill and elliptical for exercise.  Reviewed THR, pulse, RPE, sign and symptoms, pulse oximetery and when to call 911 or MD.  Also discussed weather considerations and indoor options.  Pt voiced understanding. Nichollas is doing well in rehab. He was recently able to increase from level 12 to 15 on the elliptical. He was also able to maintain a treadmill workload of and 2% incline. We will continue to monitor his progress in the program.      Expected Outcomes Short: Continue to exercise at home at least 2 additional days. Long: Continue exercise to improve strength and stamina. Short: Improve on post-6MWT. Long: Continue exercise to improve strength and stamina.         Discharge  Exercise Prescription (Final Exercise Prescription Changes):  Exercise Prescription Changes - 11/14/24 1100       Response to Exercise   Blood Pressure (Admit) 108/68    Blood Pressure (Exit) 102/60    Heart Rate (Admit) 85 bpm    Heart Rate (Exercise) 135 bpm    Heart Rate (Exit) 94 bpm    Oxygen Saturation (Admit) 98 %    Oxygen Saturation (Exercise) 96 %    Oxygen Saturation (Exit) 98 %    Rating of Perceived Exertion (Exercise) 15.3    Symptoms none    Duration Progress to 30 minutes of  aerobic without signs/symptoms of physical distress    Intensity THRR unchanged      Progression   Progression Continue to progress workloads to maintain intensity without signs/symptoms of physical distress.    Average METs 4.97      Resistance Training   Weight 10lb    Reps 10-15      Interval Training   Interval Training No      Treadmill   MPH 4    Grade 2    Minutes 15    METs 5.17      Elliptical   Level 15    Speed 4    Minutes 15    METs 6.6      Home Exercise Plan   Plans to continue exercise at Home (comment)   [name] plans to continue to use his treadmill and elliptical that he has at home.   Frequency Add 3 additional days to program exercise sessions.    Initial Home Exercises Provided 11/07/24      Oxygen   Maintain Oxygen Saturation 88% or higher          Nutrition:  Target Goals: Understanding  of nutrition guidelines, daily intake of sodium 1500mg , cholesterol 200mg , calories 30% from fat and 7% or less from saturated fats, daily to have 5 or more servings of fruits and vegetables.  Education: Nutrition 1 -Group instruction provided by verbal, written material, interactive activities, discussions, models, and posters to present general guidelines for heart healthy nutrition including macronutrients, label reading, and promoting whole foods over processed counterparts. Education serves as pensions consultant of discussion of heart healthy eating for all. Written  material provided at class time.    Education: Nutrition 2 -Group instruction provided by verbal, written material, interactive activities, discussions, models, and posters to present general guidelines for heart healthy nutrition including sodium, cholesterol, and saturated fat. Providing guidance of habit forming to improve blood pressure, cholesterol, and body weight. Written material provided at class time.     Biometrics:  Pre Biometrics - 08/31/24 1518       Pre Biometrics   Height 6' 0.1 (1.831 m)    Weight 199 lb 4.8 oz (90.4 kg)    Waist Circumference 36 inches    Hip Circumference 40.5 inches    Waist to Hip Ratio 0.89 %    BMI (Calculated) 26.97    Single Leg Stand 11.3 seconds          Post Biometrics - 11/10/24 1558        Post  Biometrics   Height 6' 0.1 (1.831 m)    Weight 182 lb 9.6 oz (82.8 kg)    Waist Circumference 35 inches    Hip Circumference 38.5 inches    Waist to Hip Ratio 0.91 %    BMI (Calculated) 24.71    Single Leg Stand 30 seconds          Nutrition Therapy Plan and Nutrition Goals:   Nutrition Assessments:  MEDIFICTS Score Key: >=70 Need to make dietary changes  40-70 Heart Healthy Diet <= 40 Therapeutic Level Cholesterol Diet  Flowsheet Row Cardiac Rehab from 08/31/2024 in [facility] Cardiac and Pulmonary Rehab  Picture Your Plate Total Score on Admission 64   Picture Your Plate Scores: <59 Unhealthy dietary pattern with much room for improvement. 41-50 Dietary pattern unlikely to meet recommendations for good health and room for improvement. 51-60 More healthful dietary pattern, with some room for improvement.  >60 Healthy dietary pattern, although there may be some specific behaviors that could be improved.    Nutrition Goals Re-Evaluation:  Nutrition Goals Re-Evaluation     Row Name 10/03/24 1621 11/07/24 1556           Goals   Comment Pt was able to set up an RD appt. Patient deferred RD appointment.          Nutrition Goals Discharge (Final Nutrition Goals Re-Evaluation):  Nutrition Goals Re-Evaluation - 11/07/24 1556       Goals   Comment Patient deferred RD appointment.          Psychosocial: Target Goals: Acknowledge presence or absence of significant depression and/or stress, maximize coping skills, provide positive support system. Participant is able to verbalize types and ability to use techniques and skills needed for reducing stress and depression.   Education: Stress, Anxiety, and Depression - Group verbal and visual presentation to define topics covered.  Reviews how body is impacted by stress, anxiety, and depression.  Also discusses healthy ways to reduce stress and to treat/manage anxiety and depression. Written material provided at class time. Flowsheet Row Cardiac Rehab from 11/09/2024 in [facility] Cardiac and Pulmonary Rehab  Date  10/05/24  Educator lc  Instruction Review Code 1- Verbalizes Understanding    Education: Sleep Hygiene -Provides group verbal and written instruction about how sleep can affect your health.  Define sleep hygiene, discuss sleep cycles and impact of sleep habits. Review good sleep hygiene tips.   Initial Review & Psychosocial Screening:  Initial Psych Review & Screening - 08/25/24 1452       Initial Review   Current issues with Current Depression      Family Dynamics   Good Support System? Yes   wife     Barriers   Psychosocial barriers to participate in program There are no identifiable barriers or psychosocial needs.;The patient should benefit from training in stress management and relaxation.      Screening Interventions   Interventions Encouraged to exercise;Provide feedback about the scores to participant;To provide support and resources with identified psychosocial needs    Expected Outcomes Short Term goal: Utilizing psychosocial counselor, staff and physician to assist with identification of specific Stressors or current issues  interfering with healing process. Setting desired goal for each stressor or current issue identified.;Long Term Goal: Stressors or current issues are controlled or eliminated.;Short Term goal: Identification and review with participant of any Quality of Life or Depression concerns found by scoring the questionnaire.;Long Term goal: The participant improves quality of Life and PHQ9 Scores as seen by post scores and/or verbalization of changes          Quality of Life Scores:   Quality of Life - 08/31/24 1519       Quality of Life   Select Quality of Life      Quality of Life Scores   Health/Function Pre 22.97 %    Socioeconomic Pre 19.19 %    Psych/Spiritual Pre 9 %    Family Pre 15.6 %    GLOBAL Pre 18.26 %         Scores of 19 and below usually indicate a poorer quality of life in these areas.  A difference of  2-3 points is a clinically meaningful difference.  A difference of 2-3 points in the total score of the Quality of Life Index has been associated with significant improvement in overall quality of life, self-image, physical symptoms, and general health in studies assessing change in quality of life.  PHQ-9: Review Flowsheet  More data exists      09/19/2024 08/31/2024 07/20/2024 01/18/2024 08/19/2023  Depression screen PHQ 2/9  Decreased Interest 0 0 0 0 0  Down, Depressed, Hopeless 0 0 0 0 0  PHQ - 2 Score 0 0 0 0 0  Altered sleeping 0 0 0 - -  Tired, decreased energy 0 0 0 - -  Change in appetite 0 0 0 - -  Feeling bad or failure about yourself  0 0 0 - -  Trouble concentrating 0 0 0 - -  Moving slowly or fidgety/restless 0 0 0 - -  Suicidal thoughts 0 0 0 - -  PHQ-9 Score 0 0  0  - -    Details       Data saved with a previous flowsheet row definition        Interpretation of Total Score  Total Score Depression Severity:  1-4 = Minimal depression, 5-9 = Mild depression, 10-14 = Moderate depression, 15-19 = Moderately severe depression, 20-27 = Severe  depression   Psychosocial Evaluation and Intervention:  Psychosocial Evaluation - 08/25/24 1453       Psychosocial Evaluation & Interventions  Interventions Stress management education;Relaxation education;Encouraged to exercise with the program and follow exercise prescription    Comments Marcey is looking forward to starting cardiac rehab to see how it will improve his daily life. He currently lives with his wife and states she is a great support for him. He reports that they both work full time, however they also enjoy taking on big home projects and that his is therapeutic for him. Marcey did report he has a ruptured bicep tendon and torn rotator cuff in his right arm, however states that this has minimal impact on his functional ability. He does admit to dealing with PTSD and depression, however he says these are well managed at this time.    Expected Outcomes Short: Attend cardiac rehab for education and exercise.Long: Develop and maintain positive self care habits.    Continue Psychosocial Services  Follow up required by staff          Psychosocial Re-Evaluation:  Psychosocial Re-Evaluation     Row Name 10/03/24 1617 11/07/24 1552           Psychosocial Re-Evaluation   Current issues with None Identified None Identified      Comments Chritopher is doing well in the program. He reports having no stress concerns at this time, and enjoys doing general housework with his wife to relieve stress. He states that he is having no sleep concerns at this time. Patient reports no issues with their current mental states, sleep, stress, depression or anxiety. Will follow up with patient in a few weeks for any changes.      Expected Outcomes Short: Continue to lean on support system, and household work to relieve stress. Long: Continue to attend heart track to manage stress. Short: Continue to exercise regularly to support mental health and notify staff of any changes. Long: maintain mental health and  well being through teaching of rehab or prescribed medications independently.      Interventions Encouraged to attend Cardiac Rehabilitation for the exercise Encouraged to attend Cardiac Rehabilitation for the exercise      Continue Psychosocial Services  Follow up required by staff Follow up required by staff         Psychosocial Discharge (Final Psychosocial Re-Evaluation):  Psychosocial Re-Evaluation - 11/07/24 1552       Psychosocial Re-Evaluation   Current issues with None Identified    Comments Patient reports no issues with their current mental states, sleep, stress, depression or anxiety. Will follow up with patient in a few weeks for any changes.    Expected Outcomes Short: Continue to exercise regularly to support mental health and notify staff of any changes. Long: maintain mental health and well being through teaching of rehab or prescribed medications independently.    Interventions Encouraged to attend Cardiac Rehabilitation for the exercise    Continue Psychosocial Services  Follow up required by staff          Vocational Rehabilitation: Provide vocational rehab assistance to qualifying candidates.   Vocational Rehab Evaluation & Intervention:  Vocational Rehab - 08/25/24 1451       Initial Vocational Rehab Evaluation & Intervention   Assessment shows need for Vocational Rehabilitation No          Education: Education Goals: Education classes will be provided on a variety of topics geared toward better understanding of heart health and risk factor modification. Participant will state understanding/return demonstration of topics presented as noted by education test scores.  Learning Barriers/Preferences:  Learning Barriers/Preferences - 08/25/24  1451       Learning Barriers/Preferences   Learning Barriers None    Learning Preferences None          General Cardiac Education Topics:  AED/CPR: - Group verbal and written instruction with the use of  models to demonstrate the basic use of the AED with the basic ABC's of resuscitation.   Test and Procedures: - Group verbal and visual presentation and models provide information about basic cardiac anatomy and function. Reviews the testing methods done to diagnose heart disease and the outcomes of the test results. Describes the treatment choices: Medical Management, Angioplasty, or Coronary Bypass Surgery for treating various heart conditions including Myocardial Infarction, Angina, Valve Disease, and Cardiac Arrhythmias. Written material provided at class time. Flowsheet Row Cardiac Rehab from 11/09/2024 in [facility] Cardiac and Pulmonary Rehab  Date 11/09/24  Educator kb  Instruction Review Code 1- Verbalizes Understanding    Medication Safety: - Group verbal and visual instruction to review commonly prescribed medications for heart and lung disease. Reviews the medication, class of the drug, and side effects. Includes the steps to properly store meds and maintain the prescription regimen. Written material provided at class time. Flowsheet Row Cardiac Rehab from 11/09/2024 in [facility] Cardiac and Pulmonary Rehab  Date 09/14/24  Educator lc  Instruction Review Code 1- Verbalizes Understanding    Intimacy: - Group verbal instruction through game format to discuss how heart and lung disease can affect sexual intimacy. Written material provided at class time.   Know Your Numbers and Heart Failure: - Group verbal and visual instruction to discuss disease risk factors for cardiac and pulmonary disease and treatment options.  Reviews associated critical values for Overweight/Obesity, Hypertension, Cholesterol, and Diabetes.  Discusses basics of heart failure: signs/symptoms and treatments.  Introduces Heart Failure Zone chart for action plan for heart failure. Written material provided at class time. Flowsheet Row Cardiac Rehab from 11/09/2024 in [facility] Cardiac and Pulmonary Rehab  Date 09/28/24  Educator mc   Instruction Review Code 1- Verbalizes Understanding    Infection Prevention: - Provides verbal and written material to individual with discussion of infection control including proper hand washing and proper equipment cleaning during exercise session. Flowsheet Row Cardiac Rehab from 11/09/2024 in [facility] Cardiac and Pulmonary Rehab  Date 08/31/24  Educator [facility]  Instruction Review Code 1- Verbalizes Understanding    Falls Prevention: - Provides verbal and written material to individual with discussion of falls prevention and safety. Flowsheet Row Cardiac Rehab from 11/09/2024 in [facility] Cardiac and Pulmonary Rehab  Date 08/31/24  Educator [facility]  Instruction Review Code 1- Verbalizes Understanding    Other: -Provides group and verbal instruction on various topics (see comments)   Knowledge Questionnaire Score:  Knowledge Questionnaire Score - 08/31/24 1521       Knowledge Questionnaire Score   Pre Score 24/26          Core Components/Risk Factors/Patient Goals at Admission:  Personal Goals and Risk Factors at Admission - 08/25/24 1450       Core Components/Risk Factors/Patient Goals on Admission    Weight Management Weight Maintenance;Yes    Intervention Weight Management: Develop a combined nutrition and exercise program designed to reach desired caloric intake, while maintaining appropriate intake of nutrient and fiber, sodium and fats, and appropriate energy expenditure required for the weight goal.;Weight Management: Provide education and appropriate resources to help participant work on and attain dietary goals.    Expected Outcomes Weight Maintenance: Understanding of the daily nutrition guidelines, which includes  25-35% calories from fat, 7% or less cal from saturated fats, less than 200mg  cholesterol, less than 1.5gm of sodium, & 5 or more servings of fruits and vegetables daily;Short Term: Continue to assess and modify interventions until short term weight is achieved;Long Term:  Adherence to nutrition and physical activity/exercise program aimed toward attainment of established weight goal;Understanding recommendations for meals to include 15-35% energy as protein, 25-35% energy from fat, 35-60% energy from carbohydrates, less than 200mg  of dietary cholesterol, 20-35 gm of total fiber daily;Understanding of distribution of calorie intake throughout the day with the consumption of 4-5 meals/snacks    Hypertension Yes    Intervention Provide education on lifestyle modifcations including regular physical activity/exercise, weight management, moderate sodium restriction and increased consumption of fresh fruit, vegetables, and low fat dairy, alcohol moderation, and smoking cessation.;Monitor prescription use compliance.    Expected Outcomes Short Term: Continued assessment and intervention until BP is < 140/65mm HG in hypertensive participants. < 130/48mm HG in hypertensive participants with diabetes, heart failure or chronic kidney disease.;Long Term: Maintenance of blood pressure at goal levels.    Lipids Yes    Intervention Provide education and support for participant on nutrition & aerobic/resistive exercise along with prescribed medications to achieve LDL 70mg , HDL >40mg .    Expected Outcomes Short Term: Participant states understanding of desired cholesterol values and is compliant with medications prescribed. Participant is following exercise prescription and nutrition guidelines.;Long Term: Cholesterol controlled with medications as prescribed, with individualized exercise RX and with personalized nutrition plan. Value goals: LDL < 70mg , HDL > 40 mg.          Education:Diabetes - Individual verbal and written instruction to review signs/symptoms of diabetes, desired ranges of glucose level fasting, after meals and with exercise. Acknowledge that pre and post exercise glucose checks will be done for 3 sessions at entry of program.   Core Components/Risk Factors/Patient  Goals Review:   Goals and Risk Factor Review     Row Name 10/03/24 1621 11/07/24 1547           Core Components/Risk Factors/Patient Goals Review   Personal Goals Review Weight Management/Obesity;Hypertension;Lipids Hypertension;Lipids      Review Juniel has recently been able to hit his target weight of 180lbs, and would like to lose about 10 more lbs. He also states that he continues to check his blood pressure at least twice a day. He continues to follow a heart healthy diet, and low sodium. Bonnie states he wants to reach a weight goal of 170 pounds. He has 5 more pounds to lose. His blood pressure have been within normal limits and he checks at home twice a day. His  cholesterol has been checked before Christmas and was in good ranges. His doctors want him to take Repatha  soon. He is going to [facility] his doctor about his lipid panel and see if he needs to take Repatha .      Expected Outcomes Short: lose 10lbs. Long: Continue to follow a heart healthy diet to manage lipid levels, as well as manage and achieve weight goals. Short: Talk to doctor about taking Repatha . Long: maintain medications independently.         Core Components/Risk Factors/Patient Goals at Discharge (Final Review):   Goals and Risk Factor Review - 11/07/24 1547       Core Components/Risk Factors/Patient Goals Review   Personal Goals Review Hypertension;Lipids    Review Vir states he wants to reach a weight goal of 170 pounds. He has 5 more  pounds to lose. His blood pressure have been within normal limits and he checks at home twice a day. His  cholesterol has been checked before Christmas and was in good ranges. His doctors want him to take Repatha  soon. He is going to [facility] his doctor about his lipid panel and see if he needs to take Repatha .    Expected Outcomes Short: Talk to doctor about taking Repatha . Long: maintain medications independently.          ITP Comments:  ITP Comments     Row Name  08/25/24 1444 08/31/24 1514 09/05/24 1537 09/21/24 1155 11/16/24 1040   ITP Comments Initial phone call completed. Diagnosis can be found in [facility] 08/22/24. EP Orientation scheduled for 08/31/24 at 1:45pm. Completed and gym orientation for cardiac rehab. Initial ITP created and sent for review to Dr. Oneil Pinal, Medical Director. First full day of exercise!  Patient was oriented to gym and equipment including functions, settings, policies, and procedures.  Patient's individual exercise prescription and treatment plan were reviewed.  All starting workloads were established based on the results of the 6 minute walk test done at initial orientation visit.  The plan for exercise progression was also introduced and progression will be customized based on patient's performance and goals. 30 Day review completed. Medical Director ITP review done, changes made as directed, and signed approval by Medical Director.  New to program 30 Day review completed. Medical Director ITP review done, changes made as directed, and signed approval by Medical Director.      Comments: 30 Day Review     [1]  Current Outpatient Medications:    ALPRAZolam  (XANAX ) 0.5 MG tablet, TAKE 1 TABLET BY MOUTH TWICE  DAILY, Disp: 180 tablet, Rfl: 0   amLODipine  (NORVASC ) 5 MG tablet, Take 1 tablet (5 mg total) by mouth daily., Disp: 90 tablet, Rfl: 3   aspirin  EC 81 MG tablet, Take 1 tablet (81 mg total) by mouth daily. Swallow whole., Disp: , Rfl:    atorvastatin  (LIPITOR ) 80 MG tablet, Take 1 tablet (80 mg total) by mouth daily., Disp: 90 tablet, Rfl: 3   buPROPion  (WELLBUTRIN  XL) 150 MG 24 hr tablet, Take 3 tablets (450 mg total) by mouth daily., Disp: 270 tablet, Rfl: 1   Evolocumab  (REPATHA  SURECLICK) 140 MG/ML SOAJ, Inject 140 mg into the skin every 14 (fourteen) days., Disp: 6 mL, Rfl: 3   ezetimibe  (ZETIA ) 10 MG tablet, Take 1 tablet (10 mg total) by mouth daily., Disp: 90 tablet, Rfl: 3   lisinopril  (ZESTRIL ) 40 MG tablet,  Take 20 mg by mouth daily., Disp: , Rfl:    nitroGLYCERIN  (NITROSTAT ) 0.4 MG SL tablet, Place 1 tablet (0.4 mg total) under the tongue every 5 (five) minutes as needed for chest pain., Disp: 25 tablet, Rfl: 3   omeprazole  (PRILOSEC) 40 MG capsule, Take 1 capsule (40 mg total) by mouth daily., Disp: , Rfl:    prasugrel  (EFFIENT ) 10 MG TABS tablet, Take 1 tablet (10 mg total) by mouth daily., Disp: 90 tablet, Rfl: 3   vortioxetine  HBr (TRINTELLIX ) 20 MG TABS tablet, Take 1 tablet (20 mg total) by mouth daily., Disp: 90 tablet, Rfl: 3 [2]  Social History Tobacco Use  Smoking Status Never  Smokeless Tobacco Never

## 2024-11-16 NOTE — Progress Notes (Signed)
 Daily Session Note  Patient Details  Name: James Arellano MRN: [id number] Date of Birth: 02/08/1961 Referring Provider:   Flowsheet Row Cardiac Rehab from 08/31/2024 in [facility] Cardiac and Pulmonary Rehab  Referring Provider Dr. Lonni End    Encounter Date: 11/16/2024  Check In:  Session Check In - 11/16/24 1552       Check-In   Supervising physician immediately available to respond to emergencies See telemetry face sheet for immediately available ER MD    Location [facility]-Cardiac & Pulmonary Rehab    Staff Present Burnard Davenport RN,BSN,MPA;Joseph [facility] Dyane BS, ACSM CEP, Exercise Physiologist;Meredith Tressa RN,BSN    Virtual Visit No    Medication changes reported     No    Fall or balance concerns reported    No    Tobacco Cessation No Change    Warm-up and Cool-down Performed on first and last piece of equipment    Resistance Training Performed Yes    VAD Patient? No    PAD/SET Patient? No      Pain Assessment   Currently in Pain? No/denies             Tobacco Use History[1]  Goals Met:  Proper associated with RPD/PD & O2 Sat Independence with exercise equipment Exercise tolerated well No report of concerns or symptoms today Strength training completed today  Goals Unmet:  Not Applicable  Comments: Pt able to follow exercise prescription today without complaint.  Will continue to monitor for progression.    Dr. Oneil Pinal is Medical Director for [facility] Cardiac Rehabilitation.  Dr. Fuad Aleskerov is Medical Director for [facility] Pulmonary Rehabilitation.    [1]  Social History Tobacco Use  Smoking Status Never  Smokeless Tobacco Never

## 2024-11-17 ENCOUNTER — Encounter: Admitting: *Deleted

## 2024-11-17 DIAGNOSIS — Z955 Presence of coronary angioplasty implant and graft: Secondary | ICD-10-CM

## 2024-11-17 NOTE — Progress Notes (Signed)
 Daily Session Note  Patient Details  Name: James Arellano MRN: [id number] Date of Birth: [birth year] Referring Provider:   Flowsheet Row Cardiac Rehab from 08/31/2024 in [facility] Cardiac and Pulmonary Rehab  Referring Provider Dr. Lonni End    Encounter Date: 11/17/2024  Check In:  Session Check In - 11/17/24 1548       Check-In   Supervising physician immediately available to respond to emergencies See telemetry face sheet for immediately available ER MD    Location [facility]-Cardiac & Pulmonary Rehab    Staff Present Othel Durand, RN, BSN, CCRP;Laureen Delores, BS, RRT, CPFT;Joseph [employer], BS, Exercise Physiologist    Virtual Visit No    Medication changes reported     No    Fall or balance concerns reported    No    Warm-up and Cool-down Performed on first and last piece of equipment    Resistance Training Performed Yes    VAD Patient? No    PAD/SET Patient? No      Pain Assessment   Currently in Pain? No/denies             Tobacco Use History[1]  Goals Met:  Independence with exercise equipment Exercise tolerated well No report of concerns or symptoms today  Goals Unmet:  Not Applicable  Comments: Pt able to follow exercise prescription today without complaint.  Will continue to monitor for progression.    Dr. Oneil Pinal is Medical Director for [facility] Cardiac Rehabilitation.  Dr. Fuad Aleskerov is Medical Director for [facility] Pulmonary Rehabilitation.    [1]  Social History Tobacco Use  Smoking Status Never  Smokeless Tobacco Never

## 2024-11-21 ENCOUNTER — Encounter: Payer: Self-pay | Admitting: Adult Health

## 2024-11-21 ENCOUNTER — Telehealth: Admitting: Adult Health

## 2024-11-21 DIAGNOSIS — F32A Depression, unspecified: Secondary | ICD-10-CM

## 2024-11-21 DIAGNOSIS — F411 Generalized anxiety disorder: Secondary | ICD-10-CM | POA: Diagnosis not present

## 2024-11-21 DIAGNOSIS — G4700 Insomnia, unspecified: Secondary | ICD-10-CM | POA: Diagnosis not present

## 2024-11-21 MED ORDER — BUSPIRONE HCL 10 MG PO TABS: 10.0000 mg | ORAL_TABLET | Freq: Three times a day (TID) | ORAL | 3 refills | Status: AC

## 2024-11-21 MED ORDER — ALPRAZOLAM 0.5 MG PO TABS: 0.5000 mg | ORAL_TABLET | Freq: Two times a day (BID) | ORAL | 0 refills | Status: AC

## 2024-11-21 MED ORDER — VORTIOXETINE HBR 20 MG PO TABS: 20.0000 mg | ORAL_TABLET | Freq: Every day | ORAL | 3 refills | Status: AC

## 2024-11-21 MED ORDER — BUPROPION HCL ER (XL) 150 MG PO TB24: 450.0000 mg | ORAL_TABLET | Freq: Every day | ORAL | 3 refills | Status: AC

## 2024-11-21 NOTE — Progress Notes (Signed)
 James Arellano [id number] 07/13/1961 64 y.o.  Virtual Visit via Video Note  I connected with pt @ on 11/21/24 at  3:30 PM EST by a video enabled telemedicine application and verified that I am speaking with the correct person using two identifiers.   I discussed the limitations of evaluation and management by telemedicine and the availability of in person appointments. The patient expressed understanding and agreed to proceed.  I discussed the assessment and treatment plan with the patient. The patient was provided an opportunity to ask questions and all were answered. The patient agreed with the plan and demonstrated an understanding of the instructions.   The patient was advised to call back or seek an in-person evaluation if the symptoms worsen or if the condition fails to improve as anticipated.  I provided 25 minutes of non-face-to-face time during this encounter.  The patient was located at home.  The provider was located at [facility].   James LOISE Sayers, NP   Subjective:   Patient ID:  James Arellano is a 64 y.o. (DOB 08/30/1961) male.  Chief Complaint: No chief complaint on file.   HPI James Arellano presents for follow-up of anxiety, depression and insomnia.  Describes mood today as ok. Pleasant. Denies tearfulness. Mood symptoms - reports some depression, anxiety and irritability with situational stressors - heart. Reports improving interest and motivation - getting better. Reports some worry, rumination and over thinking. Denies panic attacks. Reports mood is consistent. Stating I feel like I'm doing ok. Feels like medications continue to work well. Taking medications as prescribed. Energy levels stable. Active, exercising/rehab 5 times a week. Enjoys some usual interests and activities. Lives with partner - has 2 adult children.  Appetite adequate. Weight loss - 25 pounds. Sleeps better some nights than others. Averages 7 to 8 hours. Focus  and concentration stable. Completing tasks. Managing aspects of household. Work going well. Works for [employer]. Denies SI or HI.  Denies AH or VH. Denies self harm. Denies substance use.  Review of Systems:  Review of Systems  Musculoskeletal:  Negative for gait problem.  Neurological:  Negative for tremors.  Psychiatric/Behavioral:         Please refer to HPI    Medications: I have reviewed the patient's current medications.  Current Outpatient Medications  Medication Sig Dispense Refill   ALPRAZolam  (XANAX ) 0.5 MG tablet TAKE 1 TABLET BY MOUTH TWICE  DAILY 180 tablet 0   amLODipine  (NORVASC ) 5 MG tablet Take 1 tablet (5 mg total) by mouth daily. 90 tablet 3   aspirin  EC 81 MG tablet Take 1 tablet (81 mg total) by mouth daily. Swallow whole.     atorvastatin  (LIPITOR ) 80 MG tablet Take 1 tablet (80 mg total) by mouth daily. 90 tablet 3   buPROPion  (WELLBUTRIN  XL) 150 MG 24 hr tablet Take 3 tablets (450 mg total) by mouth daily. 270 tablet 1   Evolocumab  (REPATHA  SURECLICK) 140 MG/ML SOAJ Inject 140 mg into the skin every 14 (fourteen) days. 6 mL 3   ezetimibe  (ZETIA ) 10 MG tablet Take 1 tablet (10 mg total) by mouth daily. 90 tablet 3   lisinopril  (ZESTRIL ) 40 MG tablet Take 20 mg by mouth daily.     nitroGLYCERIN  (NITROSTAT ) 0.4 MG SL tablet Place 1 tablet (0.4 mg total) under the tongue every 5 (five) minutes as needed for chest pain. 25 tablet 3   omeprazole  (PRILOSEC) 40 MG capsule Take 1 capsule (40 mg total) by mouth daily.  prasugrel  (EFFIENT ) 10 MG TABS tablet Take 1 tablet (10 mg total) by mouth daily. 90 tablet 3   vortioxetine  HBr (TRINTELLIX ) 20 MG TABS tablet Take 1 tablet (20 mg total) by mouth daily. 90 tablet 3   No current facility-administered medications for this visit.    Medication Side Effects: None  Allergies: Allergies[1]  Past Medical History:  Diagnosis Date   CAD (coronary artery disease) 08/09/2024   Depression    GERD  (gastroesophageal reflux disease)    Hypertension About 10 yrs    Family History  Problem Relation Age of Onset   Dementia Mother    Heart disease Father    Heart disease Brother 30   Heart disease Brother 73   Peptic Ulcer Disease Brother        removal of part if his esophagus.   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  Tobacco Use   Smoking status: Never   Smokeless tobacco: Never  Substance and Sexual Activity   Alcohol use: Yes    Comment: wine ususally on the weekend   Drug use: No   Sexual activity: Not Currently  Other Topics Concern   Not on file  Social History Narrative   Not on file   Social Drivers of Health   Tobacco Use: Low Risk (10/25/2024)   Patient History    Smoking Tobacco Use: Never    Smokeless Tobacco Use: Never    Passive Exposure: Not on file  Financial Resource Strain: Low Risk (09/12/2024)   Overall Financial Resource Strain (CARDIA)    Difficulty of Paying Living Expenses: Not hard at all  Food Insecurity: Patient Declined (09/12/2024)   Epic    Worried About Programme Researcher, Broadcasting/film/video in the Last Year: Patient declined    Barista in the Last Year: Patient declined  Transportation Needs: Patient Declined (09/12/2024)   Epic    Lack of Transportation (Medical): Patient declined    Lack of Transportation (Non-Medical): Patient declined  Physical Activity: Sufficiently Active (09/12/2024)   Exercise Vital Sign    Days of Exercise per Week: 3 days    Minutes of Exercise per Session: 60 min  Stress: No Stress Concern Present (09/12/2024)   [employer] of Occupational Health - Occupational Stress Questionnaire    Feeling of Stress: Only a little   Social Connections: Unknown (09/12/2024)   Social Connection and Isolation Panel    Frequency of Communication with Friends and Family: Patient declined    Frequency of Social Gatherings with Friends and Family: Patient declined    Attends Religious Services: Patient declined    Active Member of Clubs or Organizations: Patient declined    Attends Banker Meetings: Not on file    Marital Status: Living with partner  Intimate Partner Violence: Not At Risk (08/08/2024)   Epic    Fear of Current or Ex-Partner: No    Emotionally Abused: No    Physically Abused: No    Sexually Abused: No  Depression (PHQ2-9): Low Risk (09/19/2024)   Depression (PHQ2-9)    PHQ-2 Score: 0  Alcohol Screen: Low Risk (09/12/2024)   Alcohol Screen    Last Alcohol Screening  Score (AUDIT): 2  Housing: Unknown (09/12/2024)   Epic    Unable to Pay for Housing in the Last Year: Patient declined    Number of Times Moved in the Last Year: 0    Homeless in the Last Year: Patient declined  Utilities: Not At Risk (08/08/2024)   Epic    Threatened with loss of utilities: No  Health Literacy: Adequate Health Literacy (01/18/2024)   B1300 Health Literacy    Frequency of need for help with medical instructions: Never    Past Medical History, Surgical history, Social history, and Family history were reviewed and updated as appropriate.   Please see review of systems for further details on the patient's review from today.   Objective:   Physical Exam:  There were no vitals taken for this visit.  Physical Exam Constitutional:      General: He is not in acute distress. Musculoskeletal:        General: No deformity.  Neurological:     Mental Status: He is alert and oriented to person, place, and time.     Coordination: Coordination normal.  Psychiatric:        Attention and Perception: Attention and perception normal. He does not perceive auditory or visual hallucinations.        Mood and Affect:  Mood normal. Mood is not anxious or depressed. Affect is not labile, blunt, angry or inappropriate.        Speech: Speech normal.        Behavior: Behavior normal.        Thought Content: Thought content normal. Thought content is not paranoid or delusional. Thought content does not include homicidal or suicidal ideation. Thought content does not include homicidal or suicidal plan.        Cognition and Memory: Cognition and memory normal.        Judgment: Judgment normal.     Comments: Insight intact     Lab Review:     Component Value Date/Time   NA 139 08/11/2024 0203   NA 140 08/19/2023 1431   K 3.8 08/11/2024 0203   CL 105 08/11/2024 0203   CO2 26 08/11/2024 0203   GLUCOSE 104 (H) 08/11/2024 0203   BUN 28 (H) 08/11/2024 0203   BUN 23 08/19/2023 1431   CREATININE 1.12 08/11/2024 0203   CALCIUM  8.9 08/11/2024 0203   PROT 6.4 (L) 08/08/2024 1135   PROT 7.4 08/19/2023 1431   ALBUMIN 3.9 08/08/2024 1135   ALBUMIN 4.8 08/19/2023 1431   AST 14 (L) 08/08/2024 1135   ALT 13 08/08/2024 1135   ALKPHOS 43 08/08/2024 1135   BILITOT 1.2 08/08/2024 1135   BILITOT 0.5 08/19/2023 1431   GFRNONAA >60 08/11/2024 0203   GFRAA 80 02/23/2020 0853       Component Value Date/Time   WBC 8.4 08/22/2024 1429   WBC 6.9 08/11/2024 0203   RBC 4.08 (L) 08/22/2024 1429   RBC 3.75 (L) 08/11/2024 0203   HGB 13.3 08/22/2024 1429   HCT 39.8 08/22/2024 1429   PLT 334 08/22/2024 1429   MCV 98 (H) 08/22/2024 1429   MCH 32.6 08/22/2024 1429   MCH 32.3 08/11/2024 0203   MCHC 33.4 08/22/2024 1429   MCHC 34.2 08/11/2024 0203   RDW 12.7 08/22/2024 1429   LYMPHSABS 1.4 08/11/2024 0203   LYMPHSABS 1.7 09/25/2021 1344   MONOABS 0.6 08/11/2024 0203   EOSABS 0.3 08/11/2024 0203   EOSABS 0.1 09/25/2021 1344   BASOSABS 0.1 08/11/2024 0203  BASOSABS 0.0 09/25/2021 1344    No results found for: POCLITH, LITHIUM   No results found for: PHENYTOIN, PHENOBARB, VALPROATE, CBMZ    .res Assessment: Plan:   Plan:  Add: Buspar  10mg  TID - has taken previously  Continue: Trintellix  20mg  daily   Wellbutrin  XL 150mg  daily - 3 daily - denies seizure history Xanax  0.5mg  as needed for anxiety  Patient advised to contact office with any questions, adverse effects, or acute worsening in signs and symptoms.  Discussed potential benefits, risks, and side effects of stimulants with patient to include increased heart rate, palpitations, insomnia, increased anxiety, increased irritability, or decreased appetite.  Instructed patient to contact office if experiencing any significant tolerability issues.  RTC 6 months  There are no diagnoses linked to this encounter.   Please see After Visit Summary for patient specific instructions.  Future Appointments  Date Time Provider Department Center  11/23/2024  3:30 PM [facility] None  11/24/2024  3:30 PM [facility] None  11/28/2024  3:30 PM [facility] None  11/30/2024  3:30 PM [facility] None  12/01/2024  3:30 PM [facility] None  12/05/2024  3:30 PM [facility] None  01/17/2025  3:20 PM Simmons-Robinson, Rockie, MD [facility]  01/23/2025  8:20 AM Perla Evalene PARAS, MD [facility] None    No orders of the defined types were placed in this encounter.     -------------------------------      [1] No Known Allergies

## 2024-11-23 ENCOUNTER — Encounter

## 2024-11-23 DIAGNOSIS — Z955 Presence of coronary angioplasty implant and graft: Secondary | ICD-10-CM

## 2024-11-23 NOTE — Progress Notes (Signed)
 Daily Session Note  Patient Details  Name: James Arellano MRN: [id number] Date of Birth: January 26, 1961 Referring Provider:   Flowsheet Row Cardiac Rehab from 08/31/2024 in [facility]  Referring Provider Dr. Lonni End    Encounter Date: 11/23/2024  Check In:  Session Check In - 11/23/24 1542       Check-In   Supervising physician immediately available to respond to emergencies See telemetry face sheet for immediately available ER MD    Location [facility]    Staff Present Burnard Davenport RN,BSN,MPA;Meredith Tressa RN,BSN;Joseph [facility] BS, ACSM CEP, Exercise Physiologist    Virtual Visit No    Medication changes reported     No    Fall or balance concerns reported    No    Tobacco Cessation No Change    Warm-up and Cool-down Performed on first and last piece of equipment    Resistance Training Performed Yes    VAD Patient? No    PAD/SET Patient? No      Pain Assessment   Currently in Pain? No/denies             Tobacco Use History[1]  Goals Met:  Independence with exercise equipment Exercise tolerated well No report of concerns or symptoms today Strength training completed today  Goals Unmet:  Not Applicable  Comments: Pt able to follow exercise prescription today without complaint.  Will continue to monitor for progression.    Dr. Oneil Pinal is Medical Director for [facility].  Dr. Fuad Aleskerov is Medical Director for [facility] Pulmonary Rehabilitation.    [1]  Social History Tobacco Use  Smoking Status Never  Smokeless Tobacco Never

## 2024-11-24 ENCOUNTER — Encounter: Admitting: *Deleted

## 2024-11-24 DIAGNOSIS — Z955 Presence of coronary angioplasty implant and graft: Secondary | ICD-10-CM

## 2024-11-24 NOTE — Progress Notes (Signed)
 Discharge Summary   James Arellano  [birth year]  [name] graduated today from  rehab with 36 sessions completed.  Details of the patient's exercise prescription and what He needs to do in order to continue the prescription and progress were discussed with patient.  Patient was given a copy of prescription and goals.  Patient verbalized understanding. [name] plans to continue to exercise by using his home treadmill and elliptical.   6 Minute Walk     Row Name 08/31/24 1515 11/10/24 1555       6 Minute Walk   Phase Initial Discharge    Distance 1350 feet 1785 feet    Distance % Change -- 32.2 %    Distance Feet Change -- 435 ft    Walk Time 6 minutes 6 minutes    # of Rest Breaks 0 0    MPH 2.56 3.38    METS 3.5 4.38    RPE 10 9    Perceived Dyspnea  0 0    VO2 Peak 12.3 15.33    Symptoms No No    Resting HR 74 bpm 66 bpm    Resting BP 116/72 102/62    Resting Oxygen Saturation  99 % 98 %    Exercise Oxygen Saturation  during 6 min walk 99 % 96 %    Max Ex. HR 105 bpm 100 bpm    Max Ex. BP 122/82 122/64    2 Minute Post BP 114/78 --

## 2024-11-24 NOTE — Progress Notes (Signed)
 Cardiac Individual Treatment Plan  Patient Details  Name: James Arellano MRN: [id number] Date of Birth: 1961/08/22 Referring Provider:   Flowsheet Row Cardiac Rehab from 08/31/2024 in [facility] Cardiac and Pulmonary Rehab  Referring Provider Dr. Lonni End    Initial Encounter Date:  Flowsheet Row Cardiac Rehab from 08/31/2024 in [facility] Cardiac and Pulmonary Rehab  Date 08/31/24    Visit Diagnosis: Status post coronary artery stent placement  Patient's Home Medications on Admission: Current Medications[1]  Past Medical History: Past Medical History:  Diagnosis Date   CAD (coronary artery disease) 08/09/2024   Depression    GERD (gastroesophageal reflux disease)    Hypertension About 10 yrs    Tobacco Use: Tobacco Use History[2]  Labs: Review Flowsheet  More data exists      Latest Ref Rng & Units 02/23/2020 09/25/2021 08/08/2024 08/09/2024 10/25/2024  Labs for ITP Cardiac and Pulmonary Rehab  Cholestrol 100 - 199 mg/dL 794  757  - 782  877   LDL (calc) 0 - 99 mg/dL 858  824  - 843  51   Direct LDL 0 - 99 mg/dL - - - - 54   HDL-C >60 mg/dL 51  54  - 51  59   Trlycerides 0 - 149 mg/dL 74  76  - 49  54   Hemoglobin A1c 4.8 - 5.6 % - - 4.8  - -     Exercise Target Goals: Exercise Program Goal: Individual exercise prescription set using results from initial 6 min walk test and THRR while considering  patients activity barriers and safety.   Exercise Prescription Goal: Initial exercise prescription builds to 30-45 minutes a day of aerobic activity, 2-3 days per week.  Home exercise guidelines will be given to patient during program as part of exercise prescription that the participant will acknowledge.   Education: Aerobic Exercise: - Group verbal and visual presentation on the components of exercise prescription. Introduces F.I.T.T principle from ACSM for exercise prescriptions.  Reviews F.I.T.T. principles of aerobic exercise including progression. Written material  provided at class time. Flowsheet Row Cardiac Rehab from 11/09/2024 in [facility] Cardiac and Pulmonary Rehab  Education need identified 08/31/24    Education: Resistance Exercise: - Group verbal and visual presentation on the components of exercise prescription. Introduces F.I.T.T principle from ACSM for exercise prescriptions  Reviews F.I.T.T. principles of resistance exercise including progression. Written material provided at class time.    Education: Exercise & Equipment Safety: - Individual verbal instruction and demonstration of equipment use and safety with use of the equipment. Flowsheet Row Cardiac Rehab from 11/09/2024 in [facility] Cardiac and Pulmonary Rehab  Date 08/31/24  Educator [facility]  Instruction Review Code 1- Verbalizes Understanding    Education: Exercise Physiology & General Exercise Guidelines: - Group verbal and written instruction with models to review the exercise physiology of the cardiovascular system and associated critical values. Provides general exercise guidelines with specific guidelines to those with heart or lung disease. Written material provided at class time. Flowsheet Row Cardiac Rehab from 11/09/2024 in [facility] Cardiac and Pulmonary Rehab  Education need identified 08/31/24    Education: Flexibility, Balance, Mind/Body Relaxation: - Group verbal and visual presentation with interactive activity on the components of exercise prescription. Introduces F.I.T.T principle from ACSM for exercise prescriptions. Reviews F.I.T.T. principles of flexibility and balance exercise training including progression. Also discusses the mind body connection.  Reviews various relaxation techniques to help reduce and manage stress (i.e. Deep breathing, progressive muscle relaxation, and visualization). Balance handout provided to  take home. Written material provided at class time.   Activity Barriers & Risk Stratification:  Activity Barriers & Cardiac Risk Stratification - 08/31/24 1516        Activity Barriers & Cardiac Risk Stratification   Activity Barriers Other (comment)    Comments ruptured right bicep tendon; right rotator cuff tear    Cardiac Risk Stratification Moderate          6 Minute Walk:  6 Minute Walk     Row Name 08/31/24 1515 11/10/24 1555       6 Minute Walk   Phase Initial Discharge    Distance 1350 feet 1785 feet    Distance % Change -- 32.2 %    Distance Feet Change -- 435 ft    Walk Time 6 minutes 6 minutes    # of Rest Breaks 0 0    MPH 2.56 3.38    METS 3.5 4.38    RPE 10 9    Perceived Dyspnea  0 0    VO2 Peak 12.3 15.33    Symptoms No No    Resting HR 74 bpm 66 bpm    Resting BP 116/72 102/62    Resting Oxygen Saturation  99 % 98 %    Exercise Oxygen Saturation  during 6 min walk 99 % 96 %    Max Ex. HR 105 bpm 100 bpm    Max Ex. BP 122/82 122/64    2 Minute Post BP 114/78 --       Oxygen Initial Assessment:   Oxygen Re-Evaluation:   Oxygen Discharge (Final Oxygen Re-Evaluation):   Initial Exercise Prescription:  Initial Exercise Prescription - 08/31/24 1500       Date of Initial Exercise RX and Referring Provider   Date 08/31/24    Referring Provider Dr. Lonni End      Oxygen   Maintain Oxygen Saturation 88% or higher      Treadmill   MPH 2.6    Grade 1    Minutes 15    METs 3.35      Elliptical   Level 1    Speed 3    Minutes 15    METs 3.5      Rower   Level 10    Watts 50    Minutes 15    METs 3.5      Prescription Details   Duration Progress to 30 minutes of continuous aerobic without signs/symptoms of physical distress      Intensity   THRR 40-80% of Max Heartrate 107-141    Ratings of Perceived Exertion 11-13    Perceived Dyspnea 0-4      Progression   Progression Continue to progress workloads to maintain intensity without signs/symptoms of physical distress.      Resistance Training   Training Prescription Yes    Weight 10lb    Reps 10-15          Perform Capillary  Blood Glucose checks as needed.  Exercise Prescription Changes:   Exercise Prescription Changes     Row Name 08/31/24 1500 09/15/24 1500 09/27/24 1500 10/12/24 1100 10/31/24 1300     Response to Exercise   Blood Pressure (Admit) 116/72 108/56 106/64 118/64 94/52   Blood Pressure (Exercise) 122/82 140/80 142/60 140/74 --   Blood Pressure (Exit) 114/78 100/62 108/58 102/56 98/56   Heart Rate (Admit) 74 bpm 72 bpm 79 bpm 89 bpm 67 bpm   Heart Rate (Exercise) 105 bpm 129 bpm 140 bpm  141 bpm 153 bpm   Heart Rate (Exit) 80 bpm 104 bpm 91 bpm 87 bpm 95 bpm   Oxygen Saturation (Admit) 99 % -- -- -- --   Oxygen Saturation (Exercise) 99 % -- -- -- --   Oxygen Saturation (Exit) 99 % -- -- -- --   Rating of Perceived Exertion (Exercise) 10 13 15 16 16    Perceived Dyspnea (Exercise) 0 0 -- -- --   Symptoms none none none none none   Comments results 1st week of exercise -- -- --   Duration -- Progress to 30 minutes of  aerobic without signs/symptoms of physical distress Progress to 30 minutes of  aerobic without signs/symptoms of physical distress Progress to 30 minutes of  aerobic without signs/symptoms of physical distress Progress to 30 minutes of  aerobic without signs/symptoms of physical distress   Intensity -- THRR unchanged THRR unchanged THRR unchanged THRR unchanged     Progression   Progression -- Continue to progress workloads to maintain intensity without signs/symptoms of physical distress. Continue to progress workloads to maintain intensity without signs/symptoms of physical distress. Continue to progress workloads to maintain intensity without signs/symptoms of physical distress. Continue to progress workloads to maintain intensity without signs/symptoms of physical distress.   Average METs -- 3.4 4.18 4.72 4.8     Resistance Training   Training Prescription -- Yes Yes Yes --   Weight -- 10lb 10lb 10lb 10lb   Reps -- 10-15 10-15 10-15 10-15     Interval Training   Interval  Training -- No No No No     Treadmill   MPH -- 3.2 4 4 4    Grade -- 1 1.5 2 2    Minutes -- 15 15 15 15    METs -- 3.89 4.89 5.17 5.17     Elliptical   Level -- 3 5 7 12    Speed -- 3 2 2 2    Minutes -- 15 15 15 15    METs -- 2.8 5 4.6 --     Oxygen   Maintain Oxygen Saturation -- 88% or higher 88% or higher 88% or higher 88% or higher    Row Name 11/07/24 1500 11/14/24 1100 11/23/24 1400         Response to Exercise   Blood Pressure (Admit) -- 108/68 102/60     Blood Pressure (Exit) -- 102/60 100/58     Heart Rate (Admit) -- 85 bpm 85 bpm     Heart Rate (Exercise) -- 135 bpm 128 bpm     Heart Rate (Exit) -- 94 bpm 92 bpm     Oxygen Saturation (Admit) -- 98 % 97 %     Oxygen Saturation (Exercise) -- 96 % 95 %     Oxygen Saturation (Exit) -- 98 % 96 %     Rating of Perceived Exertion (Exercise) -- 15.3 15     Symptoms -- none none     Duration -- Progress to 30 minutes of  aerobic without signs/symptoms of physical distress Progress to 30 minutes of  aerobic without signs/symptoms of physical distress     Intensity -- THRR unchanged THRR unchanged       Progression   Progression -- Continue to progress workloads to maintain intensity without signs/symptoms of physical distress. Continue to progress workloads to maintain intensity without signs/symptoms of physical distress.     Average METs -- 4.97 5.35       Resistance Training   Weight -- 10lb 10lb  Reps -- 10-15 10-15       Interval Training   Interval Training -- No No       Treadmill   MPH -- 4 4.2     Grade -- 2 1     Minutes -- 15 15     METs -- 5.17 4.8       Elliptical   Level -- 15 17     Speed -- 4 4     Minutes -- 15 15     METs -- 6.6 6.3       Home Exercise Plan   Plans to continue exercise at Home (comment)  James Arellano plans to continue to use his treadmill and elliptical that he has at home. Home (comment)  James Arellano plans to continue to use his treadmill and elliptical that he has at home. Home  (comment)  James Arellano plans to continue to use his treadmill and elliptical that he has at home.     Frequency Add 3 additional days to program exercise sessions. Add 3 additional days to program exercise sessions. Add 3 additional days to program exercise sessions.     Initial Home Exercises Provided 11/07/24 11/07/24 11/07/24       Oxygen   Maintain Oxygen Saturation -- 88% or higher 88% or higher        Exercise Comments:   Exercise Comments     Row Name 09/05/24 1538           Exercise Comments First full day of exercise!  Patient was oriented to gym and equipment including functions, settings, policies, and procedures.  Patient's individual exercise prescription and treatment plan were reviewed.  All starting workloads were established based on the results of the 6 minute walk test done at initial orientation visit.  The plan for exercise progression was also introduced and progression will be customized based on patient's performance and goals.          Exercise Goals and Review:   Exercise Goals     Row Name 08/31/24 1518             Exercise Goals   Increase Physical Activity Yes       Intervention Develop an individualized exercise prescription for aerobic and resistive training based on initial evaluation findings, risk stratification, comorbidities and participant's personal goals.;Provide advice, education, support and counseling about physical activity/exercise needs.       Expected Outcomes Short Term: Attend rehab on a regular basis to increase amount of physical activity.;Long Term: Add in home exercise to make exercise part of routine and to increase amount of physical activity.;Long Term: Exercising regularly at least 3-5 days a week.       Increase Strength and Stamina Yes       Intervention Provide advice, education, support and counseling about physical activity/exercise needs.;Develop an individualized exercise prescription for aerobic and resistive training  based on initial evaluation findings, risk stratification, comorbidities and participant's personal goals.       Expected Outcomes Short Term: Increase workloads from initial exercise prescription for resistance, speed, and METs.;Long Term: Improve cardiorespiratory fitness, muscular endurance and strength as measured by increased METs and functional capacity ( );Short Term: Perform resistance training exercises routinely during rehab and add in resistance training at home       Able to understand and use rate of perceived exertion (RPE) scale Yes       Intervention Provide education and explanation on how to use RPE scale  Expected Outcomes Short Term: Able to use RPE daily in rehab to express subjective intensity level;Long Term:  Able to use RPE to guide intensity level when exercising independently       Able to understand and use Dyspnea scale Yes       Intervention Provide education and explanation on how to use Dyspnea scale       Expected Outcomes Long Term: Able to use Dyspnea scale to guide intensity level when exercising independently;Short Term: Able to use Dyspnea scale daily in rehab to express subjective sense of shortness of breath during exertion       Knowledge and understanding of Target Heart Rate Range (THRR) Yes       Intervention Provide education and explanation of THRR including how the numbers were predicted and where they are located for reference       Expected Outcomes Short Term: Able to state/look up THRR;Short Term: Able to use daily as guideline for intensity in rehab;Long Term: Able to use THRR to govern intensity when exercising independently       Able to check pulse independently Yes       Intervention Provide education and demonstration on how to check pulse in carotid and radial arteries.;Review the importance of being able to check your own pulse for safety during independent exercise       Expected Outcomes Short Term: Able to explain why pulse checking  is important during independent exercise;Long Term: Able to check pulse independently and accurately       Understanding of Exercise Prescription Yes       Intervention Provide education, explanation, and written materials on patient's individual exercise prescription       Expected Outcomes Short Term: Able to explain program exercise prescription;Long Term: Able to explain home exercise prescription to exercise independently          Exercise Goals Re-Evaluation :  Exercise Goals Re-Evaluation     Row Name 09/05/24 1538 09/15/24 1506 09/27/24 1521 10/12/24 1131 10/31/24 1359     Exercise Goal Re-Evaluation   Exercise Goals Review Increase Physical Activity;Understanding of Exercise Prescription;Knowledge and understanding of Target Heart Rate Range (THRR);Able to understand and use rate of perceived exertion (RPE) scale;Increase Strength and Stamina;Able to understand and use Dyspnea scale;Able to check pulse independently Increase Physical Activity;Understanding of Exercise Prescription;Increase Strength and Stamina Increase Physical Activity;Understanding of Exercise Prescription;Increase Strength and Stamina Increase Physical Activity;Understanding of Exercise Prescription;Increase Strength and Stamina Increase Physical Activity;Understanding of Exercise Prescription;Increase Strength and Stamina   Comments Reviewed RPE and dyspnea scale, THR and program prescription with pt today.  Pt voiced understanding and was given a copy of goals to take home. James Arellano is off to a good start in the program. He completed his first week of exercise in this review. He was able to work at a speed of 3.2 mph on the treadmill with 1% incline. He also worked at level 3 on the elliptical. We will continue to monitor his progress in the program. James Arellano is doing well in rehab. He was able to increase his workload on the treadmill to a speed of 4 mph and incline of 1.5%. He also increased to level 5 on the elliptical.  We will continue to monitor his progress in the program. James Arellano is doing well in rehab. He increased his incline on the treadmill to 2% while maintaining a speed of 4 mph.. He also increased to level 7 on the elliptical. We will continue to monitor his progress  in the program. James Arellano continues to do well in rehab. He was able to increase from level 7 to 12 on the elliptical. He was also able to maintain his workload on the treadmill at a speed of and 2% incline. We will continue to monitor his progress in the program.   Expected Outcomes Short: Use RPE daily to regulate intensity.  Long: Follow program prescription in THR. Short: Continue to follow current exercise prescription. Long: Continue exercise to improve strength and stamina. Short: Continue to progressively increase treadmill and elliptical workloads. Long: Continue exercise to improve strength and stamina. Short: Continue to progressively increase treadmill incline and elliptical workloads. Long: Continue exercise to improve strength and stamina. Short: Continue to increase elliptical workload. Long: Continue exercise to improve strength and stamina.    Row Name 11/07/24 1546 11/14/24 1136 11/23/24 1414         Exercise Goal Re-Evaluation   Exercise Goals Review Understanding of Exercise Prescription;Able to understand and use Dyspnea scale;Knowledge and understanding of Target Heart Rate Range (THRR);Increase Strength and Stamina;Able to understand and use rate of perceived exertion (RPE) scale;Able to check pulse independently Increase Physical Activity;Increase Strength and Stamina;Understanding of Exercise Prescription Increase Physical Activity;Increase Strength and Stamina;Understanding of Exercise Prescription     Comments Reviewed home exercise with pt today.  Pt plans to use his treadmill and elliptical for exercise.  Reviewed THR, pulse, RPE, sign and symptoms, pulse oximetery and when to call 911 or MD.  Also discussed weather  considerations and indoor options.  Pt voiced understanding. James Arellano is doing well in rehab. He was recently able to increase from level 12 to 15 on the elliptical. He was also able to maintain a treadmill workload of and 2% incline. We will continue to monitor his progress in the program. James Arellano continues to do well in rehab. He recently completed his post and was able to improve by 32.2%. He was also able to increase from level 15 to 17 on the elliptical, and increase from 1% to 2% incline on the treadmill. We will continue to monitor his progress in the program.     Expected Outcomes Short: Continue to exercise at home at least 2 additional days. Long: Continue exercise to improve strength and stamina. Short: Improve on post-6MWT. Long: Continue exercise to improve strength and stamina. Short: Graduate. Long: Continue exercise to improve strength and stamina.        Discharge Exercise Prescription (Final Exercise Prescription Changes):  Exercise Prescription Changes - 11/23/24 1400       Response to Exercise   Blood Pressure (Admit) 102/60    Blood Pressure (Exit) 100/58    Heart Rate (Admit) 85 bpm    Heart Rate (Exercise) 128 bpm    Heart Rate (Exit) 92 bpm    Oxygen Saturation (Admit) 97 %    Oxygen Saturation (Exercise) 95 %    Oxygen Saturation (Exit) 96 %    Rating of Perceived Exertion (Exercise) 15    Symptoms none    Duration Progress to 30 minutes of  aerobic without signs/symptoms of physical distress    Intensity THRR unchanged      Progression   Progression Continue to progress workloads to maintain intensity without signs/symptoms of physical distress.    Average METs 5.35      Resistance Training   Weight 10lb    Reps 10-15      Interval Training   Interval Training No      Treadmill  MPH 4.2    Grade 1    Minutes 15    METs 4.8      Elliptical   Level 17    Speed 4    Minutes 15    METs 6.3      Home Exercise Plan   Plans to continue  exercise at Home (comment)   James Arellano plans to continue to use his treadmill and elliptical that he has at home.   Frequency Add 3 additional days to program exercise sessions.    Initial Home Exercises Provided 11/07/24      Oxygen   Maintain Oxygen Saturation 88% or higher          Nutrition:  Target Goals: Understanding of nutrition guidelines, daily intake of sodium 1500mg , cholesterol 200mg , calories 30% from fat and 7% or less from saturated fats, daily to have 5 or more servings of fruits and vegetables.  Education: Nutrition 1 -Group instruction provided by verbal, written material, interactive activities, discussions, models, and posters to present general guidelines for heart healthy nutrition including macronutrients, label reading, and promoting whole foods over processed counterparts. Education serves as pensions consultant of discussion of heart healthy eating for all. Written material provided at class time.    Education: Nutrition 2 -Group instruction provided by verbal, written material, interactive activities, discussions, models, and posters to present general guidelines for heart healthy nutrition including sodium, cholesterol, and saturated fat. Providing guidance of habit forming to improve blood pressure, cholesterol, and body weight. Written material provided at class time.     Biometrics:  Pre Biometrics - 08/31/24 1518       Pre Biometrics   Height 6' 0.1 (1.831 m)    Weight 199 lb 4.8 oz (90.4 kg)    Waist Circumference 36 inches    Hip Circumference 40.5 inches    Waist to Hip Ratio 0.89 %    BMI (Calculated) 26.97    Single Leg Stand 11.3 seconds          Post Biometrics - 11/10/24 1558        Post  Biometrics   Height 6' 0.1 (1.831 m)    Weight 182 lb 9.6 oz (82.8 kg)    Waist Circumference 35 inches    Hip Circumference 38.5 inches    Waist to Hip Ratio 0.91 %    BMI (Calculated) 24.71    Single Leg Stand 30 seconds          Nutrition  Therapy Plan and Nutrition Goals:   Nutrition Assessments:  MEDIFICTS Score Key: >=70 Need to make dietary changes  40-70 Heart Healthy Diet <= 40 Therapeutic Level Cholesterol Diet  Flowsheet Row Cardiac Rehab from 08/31/2024 in [facility] Cardiac and Pulmonary Rehab  Picture Your Plate Total Score on Admission 64   Picture Your Plate Scores: <59 Unhealthy dietary pattern with much room for improvement. 41-50 Dietary pattern unlikely to meet recommendations for good health and room for improvement. 51-60 More healthful dietary pattern, with some room for improvement.  >60 Healthy dietary pattern, although there may be some specific behaviors that could be improved.    Nutrition Goals Re-Evaluation:  Nutrition Goals Re-Evaluation     Row Name 10/03/24 1621 11/07/24 1556           Goals   Comment Pt was able to set up an RD appt. Patient deferred RD appointment.         Nutrition Goals Discharge (Final Nutrition Goals Re-Evaluation):  Nutrition Goals Re-Evaluation - 11/07/24 1556  Goals   Comment Patient deferred RD appointment.          Psychosocial: Target Goals: Acknowledge presence or absence of significant depression and/or stress, maximize coping skills, provide positive support system. Participant is able to verbalize types and ability to use techniques and skills needed for reducing stress and depression.   Education: Stress, Anxiety, and Depression - Group verbal and visual presentation to define topics covered.  Reviews how body is impacted by stress, anxiety, and depression.  Also discusses healthy ways to reduce stress and to treat/manage anxiety and depression. Written material provided at class time. Flowsheet Row Cardiac Rehab from 11/09/2024 in [facility] Cardiac and Pulmonary Rehab  Date 10/05/24  Educator lc  Instruction Review Code 1- Verbalizes Understanding    Education: Sleep Hygiene -Provides group verbal and written instruction about how sleep can  affect your health.  Define sleep hygiene, discuss sleep cycles and impact of sleep habits. Review good sleep hygiene tips.   Initial Review & Psychosocial Screening:  Initial Psych Review & Screening - 08/25/24 1452       Initial Review   Current issues with Current Depression      Family Dynamics   Good Support System? Yes   wife     Barriers   Psychosocial barriers to participate in program There are no identifiable barriers or psychosocial needs.;The patient should benefit from training in stress management and relaxation.      Screening Interventions   Interventions Encouraged to exercise;Provide feedback about the scores to participant;To provide support and resources with identified psychosocial needs    Expected Outcomes Short Term goal: Utilizing psychosocial counselor, staff and physician to assist with identification of specific Stressors or current issues interfering with healing process. Setting desired goal for each stressor or current issue identified.;Long Term Goal: Stressors or current issues are controlled or eliminated.;Short Term goal: Identification and review with participant of any Quality of Life or Depression concerns found by scoring the questionnaire.;Long Term goal: The participant improves quality of Life and PHQ9 Scores as seen by post scores and/or verbalization of changes          Quality of Life Scores:   Quality of Life - 08/31/24 1519       Quality of Life   Select Quality of Life      Quality of Life Scores   Health/Function Pre 22.97 %    Socioeconomic Pre 19.19 %    Psych/Spiritual Pre 9 %    Family Pre 15.6 %    GLOBAL Pre 18.26 %         Scores of 19 and below usually indicate a poorer quality of life in these areas.  A difference of  2-3 points is a clinically meaningful difference.  A difference of 2-3 points in the total score of the Quality of Life Index has been associated with significant improvement in overall quality of life,  self-image, physical symptoms, and general health in studies assessing change in quality of life.  PHQ-9: Review Flowsheet  More data exists      09/19/2024 08/31/2024 07/20/2024 01/18/2024 08/19/2023  Depression screen PHQ 2/9  Decreased Interest 0 0 0 0 0  Down, Depressed, Hopeless 0 0 0 0 0  PHQ - 2 Score 0 0 0 0 0  Altered sleeping 0 0 0 - -  Tired, decreased energy 0 0 0 - -  Change in appetite 0 0 0 - -  Feeling bad or failure about yourself  0 0  0 - -  Trouble concentrating 0 0 0 - -  Moving slowly or fidgety/restless 0 0 0 - -  Suicidal thoughts 0 0 0 - -  PHQ-9 Score 0 0  0  - -    Details       Data saved with a previous flowsheet row definition        Interpretation of Total Score  Total Score Depression Severity:  1-4 = Minimal depression, 5-9 = Mild depression, 10-14 = Moderate depression, 15-19 = Moderately severe depression, 20-27 = Severe depression   Psychosocial Evaluation and Intervention:  Psychosocial Evaluation - 08/25/24 1453       Psychosocial Evaluation & Interventions   Interventions Stress management education;Relaxation education;Encouraged to exercise with the program and follow exercise prescription    Comments James Arellano is looking forward to starting cardiac rehab to see how it will improve his daily life. He currently lives with his wife and states she is a great support for him. He reports that they both work full time, however they also enjoy taking on big home projects and that his is therapeutic for him. James Arellano did report he has a ruptured bicep tendon and torn rotator cuff in his right arm, however states that this has minimal impact on his functional ability. He does admit to dealing with PTSD and depression, however he says these are well managed at this time.    Expected Outcomes Short: Attend cardiac rehab for education and exercise.Long: Develop and maintain positive self care habits.    Continue Psychosocial Services  Follow up required by  staff          Psychosocial Re-Evaluation:  Psychosocial Re-Evaluation     Row Name 10/03/24 1617 11/07/24 1552           Psychosocial Re-Evaluation   Current issues with None Identified None Identified      Comments James Arellano is doing well in the program. He reports having no stress concerns at this time, and enjoys doing general housework with his wife to relieve stress. He states that he is having no sleep concerns at this time. Patient reports no issues with their current mental states, sleep, stress, depression or anxiety. Will follow up with patient in a few weeks for any changes.      Expected Outcomes Short: Continue to lean on support system, and household work to relieve stress. Long: Continue to attend heart track to manage stress. Short: Continue to exercise regularly to support mental health and notify staff of any changes. Long: maintain mental health and well being through teaching of rehab or prescribed medications independently.      Interventions Encouraged to attend Cardiac Rehabilitation for the exercise Encouraged to attend Cardiac Rehabilitation for the exercise      Continue Psychosocial Services  Follow up required by staff Follow up required by staff         Psychosocial Discharge (Final Psychosocial Re-Evaluation):  Psychosocial Re-Evaluation - 11/07/24 1552       Psychosocial Re-Evaluation   Current issues with None Identified    Comments Patient reports no issues with their current mental states, sleep, stress, depression or anxiety. Will follow up with patient in a few weeks for any changes.    Expected Outcomes Short: Continue to exercise regularly to support mental health and notify staff of any changes. Long: maintain mental health and well being through teaching of rehab or prescribed medications independently.    Interventions Encouraged to attend Cardiac Rehabilitation for the exercise  Continue Psychosocial Services  Follow up required by staff           Vocational Rehabilitation: Provide vocational rehab assistance to qualifying candidates.   Vocational Rehab Evaluation & Intervention:  Vocational Rehab - 08/25/24 1451       Initial Vocational Rehab Evaluation & Intervention   Assessment shows need for Vocational Rehabilitation No          Education: Education Goals: Education classes will be provided on a variety of topics geared toward better understanding of heart health and risk factor modification. Participant will state understanding/return demonstration of topics presented as noted by education test scores.  Learning Barriers/Preferences:  Learning Barriers/Preferences - 08/25/24 1451       Learning Barriers/Preferences   Learning Barriers None    Learning Preferences None          General Cardiac Education Topics:  AED/CPR: - Group verbal and written instruction with the use of models to demonstrate the basic use of the AED with the basic ABC's of resuscitation.   Test and Procedures: - Group verbal and visual presentation and models provide information about basic cardiac anatomy and function. Reviews the testing methods done to diagnose heart disease and the outcomes of the test results. Describes the treatment choices: Medical Management, Angioplasty, or Coronary Bypass Surgery for treating various heart conditions including Myocardial Infarction, Angina, Valve Disease, and Cardiac Arrhythmias. Written material provided at class time. Flowsheet Row Cardiac Rehab from 11/09/2024 in [facility] Cardiac and Pulmonary Rehab  Date 11/09/24  Educator kb  Instruction Review Code 1- Verbalizes Understanding    Medication Safety: - Group verbal and visual instruction to review commonly prescribed medications for heart and lung disease. Reviews the medication, class of the drug, and side effects. Includes the steps to properly store meds and maintain the prescription regimen. Written material provided at class  time. Flowsheet Row Cardiac Rehab from 11/09/2024 in [facility] Cardiac and Pulmonary Rehab  Date 09/14/24  Educator lc  Instruction Review Code 1- Verbalizes Understanding    Intimacy: - Group verbal instruction through game format to discuss how heart and lung disease can affect sexual intimacy. Written material provided at class time.   Know Your Numbers and Heart Failure: - Group verbal and visual instruction to discuss disease risk factors for cardiac and pulmonary disease and treatment options.  Reviews associated critical values for Overweight/Obesity, Hypertension, Cholesterol, and Diabetes.  Discusses basics of heart failure: signs/symptoms and treatments.  Introduces Heart Failure Zone chart for action plan for heart failure. Written material provided at class time. Flowsheet Row Cardiac Rehab from 11/09/2024 in [facility] Cardiac and Pulmonary Rehab  Date 09/28/24  Educator mc  Instruction Review Code 1- Verbalizes Understanding    Infection Prevention: - Provides verbal and written material to individual with discussion of infection control including proper hand washing and proper equipment cleaning during exercise session. Flowsheet Row Cardiac Rehab from 11/09/2024 in [facility] Cardiac and Pulmonary Rehab  Date 08/31/24  Educator [facility]  Instruction Review Code 1- Verbalizes Understanding    Falls Prevention: - Provides verbal and written material to individual with discussion of falls prevention and safety. Flowsheet Row Cardiac Rehab from 11/09/2024 in [facility] Cardiac and Pulmonary Rehab  Date 08/31/24  Educator [facility]  Instruction Review Code 1- Verbalizes Understanding    Other: -Provides group and verbal instruction on various topics (see comments)   Knowledge Questionnaire Score:  Knowledge Questionnaire Score - 08/31/24 1521       Knowledge Questionnaire Score   Pre  Score 24/26          Core Components/Risk Factors/Patient Goals at Admission:  Personal Goals and Risk Factors at  Admission - 08/25/24 1450       Core Components/Risk Factors/Patient Goals on Admission    Weight Management Weight Maintenance;Yes    Intervention Weight Management: Develop a combined nutrition and exercise program designed to reach desired caloric intake, while maintaining appropriate intake of nutrient and fiber, sodium and fats, and appropriate energy expenditure required for the weight goal.;Weight Management: Provide education and appropriate resources to help participant work on and attain dietary goals.    Expected Outcomes Weight Maintenance: Understanding of the daily nutrition guidelines, which includes 25-35% calories from fat, 7% or less cal from saturated fats, less than 200mg  cholesterol, less than 1.5gm of sodium, & 5 or more servings of fruits and vegetables daily;Short Term: Continue to assess and modify interventions until short term weight is achieved;Long Term: Adherence to nutrition and physical activity/exercise program aimed toward attainment of established weight goal;Understanding recommendations for meals to include 15-35% energy as protein, 25-35% energy from fat, 35-60% energy from carbohydrates, less than 200mg  of dietary cholesterol, 20-35 gm of total fiber daily;Understanding of distribution of calorie intake throughout the day with the consumption of 4-5 meals/snacks    Hypertension Yes    Intervention Provide education on lifestyle modifcations including regular physical activity/exercise, weight management, moderate sodium restriction and increased consumption of fresh fruit, vegetables, and low fat dairy, alcohol moderation, and smoking cessation.;Monitor prescription use compliance.    Expected Outcomes Short Term: Continued assessment and intervention until BP is < 140/53mm HG in hypertensive participants. < 130/49mm HG in hypertensive participants with diabetes, heart failure or chronic kidney disease.;Long Term: Maintenance of blood pressure at goal levels.     Lipids Yes    Intervention Provide education and support for participant on nutrition & aerobic/resistive exercise along with prescribed medications to achieve LDL 70mg , HDL >40mg .    Expected Outcomes Short Term: Participant states understanding of desired cholesterol values and is compliant with medications prescribed. Participant is following exercise prescription and nutrition guidelines.;Long Term: Cholesterol controlled with medications as prescribed, with individualized exercise RX and with personalized nutrition plan. Value goals: LDL < 70mg , HDL > 40 mg.          Education:Diabetes - Individual verbal and written instruction to review signs/symptoms of diabetes, desired ranges of glucose level fasting, after meals and with exercise. Acknowledge that pre and post exercise glucose checks will be done for 3 sessions at entry of program.   Core Components/Risk Factors/Patient Goals Review:   Goals and Risk Factor Review     Row Name 10/03/24 1621 11/07/24 1547           Core Components/Risk Factors/Patient Goals Review   Personal Goals Review Weight Management/Obesity;Hypertension;Lipids Hypertension;Lipids      Review James Arellano has recently been able to hit his target weight of 180lbs, and would like to lose about 10 more lbs. He also states that he continues to check his blood pressure at least twice a day. He continues to follow a heart healthy diet, and low sodium. James Arellano states he wants to reach a weight goal of 170 pounds. He has 5 more pounds to lose. His blood pressure have been within normal limits and he checks at home twice a day. His  cholesterol has been checked before Christmas and was in good ranges. His doctors want him to take Repatha  soon. He is going to [employer] his doctor  about his lipid panel and see if he needs to take Repatha .      Expected Outcomes Short: lose 10lbs. Long: Continue to follow a heart healthy diet to manage lipid levels, as well as manage and achieve  weight goals. Short: Talk to doctor about taking Repatha . Long: maintain medications independently.         Core Components/Risk Factors/Patient Goals at Discharge (Final Review):   Goals and Risk Factor Review - 11/07/24 1547       Core Components/Risk Factors/Patient Goals Review   Personal Goals Review Hypertension;Lipids    Review James Arellano states he wants to reach a weight goal of 170 pounds. He has 5 more pounds to lose. His blood pressure have been within normal limits and he checks at home twice a day. His  cholesterol has been checked before Christmas and was in good ranges. His doctors want him to take Repatha  soon. He is going to [facility] his doctor about his lipid panel and see if he needs to take Repatha .    Expected Outcomes Short: Talk to doctor about taking Repatha . Long: maintain medications independently.          ITP Comments:  ITP Comments     Row Name 08/25/24 1444 08/31/24 1514 09/05/24 1537 09/21/24 1155 11/16/24 1040   ITP Comments Initial phone call completed. Diagnosis can be found in [facility] 08/22/24. EP Orientation scheduled for 08/31/24 at 1:45pm. Completed and gym orientation for cardiac rehab. Initial ITP created and sent for review to Dr. Oneil Pinal, Medical Director. First full day of exercise!  Patient was oriented to gym and equipment including functions, settings, policies, and procedures.  Patient's individual exercise prescription and treatment plan were reviewed.  All starting workloads were established based on the results of the 6 minute walk test done at initial orientation visit.  The plan for exercise progression was also introduced and progression will be customized based on patient's performance and goals. 30 Day review completed. Medical Director ITP review done, changes made as directed, and signed approval by Medical Director.  New to program 30 Day review completed. Medical Director ITP review done, changes made as directed, and signed approval by  Medical Director.    Row Name 11/24/24 1538           ITP Comments James Arellano graduated today from  rehab with 36 sessions completed.  Details of the patient's exercise prescription and what He needs to do in order to continue the prescription and progress were discussed with patient.  Patient was given a copy of prescription and goals.  Patient verbalized understanding. James Arellano plans to continue to exercise by using his home treadmill and elliptical.          Comments: Discharge ITP    [1]  Current Outpatient Medications:    ALPRAZolam  (XANAX ) 0.5 MG tablet, Take 1 tablet (0.5 mg total) by mouth 2 (two) times daily., Disp: 180 tablet, Rfl: 0   amLODipine  (NORVASC ) 5 MG tablet, Take 1 tablet (5 mg total) by mouth daily., Disp: 90 tablet, Rfl: 3   aspirin  EC 81 MG tablet, Take 1 tablet (81 mg total) by mouth daily. Swallow whole., Disp: , Rfl:    atorvastatin  (LIPITOR ) 80 MG tablet, Take 1 tablet (80 mg total) by mouth daily., Disp: 90 tablet, Rfl: 3   buPROPion  (WELLBUTRIN  XL) 150 MG 24 hr tablet, Take 3 tablets (450 mg total) by mouth daily., Disp: 270 tablet, Rfl: 3   busPIRone  (BUSPAR ) 10 MG tablet, Take  1 tablet (10 mg total) by mouth 3 (three) times daily., Disp: 270 tablet, Rfl: 3   Evolocumab  (REPATHA  SURECLICK) 140 MG/ML SOAJ, Inject 140 mg into the skin every 14 (fourteen) days., Disp: 6 mL, Rfl: 3   ezetimibe  (ZETIA ) 10 MG tablet, Take 1 tablet (10 mg total) by mouth daily., Disp: 90 tablet, Rfl: 3   lisinopril  (ZESTRIL ) 40 MG tablet, Take 20 mg by mouth daily., Disp: , Rfl:    nitroGLYCERIN  (NITROSTAT ) 0.4 MG SL tablet, Place 1 tablet (0.4 mg total) under the tongue every 5 (five) minutes as needed for chest pain., Disp: 25 tablet, Rfl: 3   omeprazole  (PRILOSEC) 40 MG capsule, Take 1 capsule (40 mg total) by mouth daily., Disp: , Rfl:    prasugrel  (EFFIENT ) 10 MG TABS tablet, Take 1 tablet (10 mg total) by mouth daily., Disp: 90 tablet, Rfl: 3   vortioxetine  HBr (TRINTELLIX ) 20 MG  TABS tablet, Take 1 tablet (20 mg total) by mouth daily., Disp: 90 tablet, Rfl: 3 [2]  Social History Tobacco Use  Smoking Status Never  Smokeless Tobacco Never

## 2024-11-24 NOTE — Progress Notes (Signed)
 Daily Session Note  Patient Details  Name: James Arellano MRN: [id number] Date of Birth: 1961-04-02 Referring Provider:   Flowsheet Row Cardiac Rehab from 08/31/2024 in [facility] Cardiac and Pulmonary Rehab  Referring Provider Dr. Lonni End    Encounter Date: 11/24/2024  Check In:  Session Check In - 11/24/24 1535       Check-In   Supervising physician immediately available to respond to emergencies See telemetry face sheet for immediately available ER MD    Location [facility]-Cardiac & Pulmonary Rehab    Staff Present Hoy Rodney RN,BSN;Joseph [place] [facility] RCP,RRT,BSRT;Margaret Best, MS, Exercise Physiologist;Noah Tickle, BS, Exercise Physiologist    Virtual Visit No    Medication changes reported     No    Fall or balance concerns reported    No    Warm-up and Cool-down Performed on first and last piece of equipment    Resistance Training Performed Yes    VAD Patient? No    PAD/SET Patient? No      Pain Assessment   Currently in Pain? No/denies             Tobacco Use History[1]  Goals Met:  Independence with exercise equipment Exercise tolerated well No report of concerns or symptoms today Strength training completed today  Goals Unmet:  Not Applicable  Comments:  Issiah graduated today from  rehab with 36 sessions completed.  Details of the patient's exercise prescription and what He needs to do in order to continue the prescription and progress were discussed with patient.  Patient was given a copy of prescription and goals.  Patient verbalized understanding. [name] plans to continue to exercise by using his home treadmill and elliptical.     Dr. Oneil Pinal is Medical Director for [facility] Cardiac Rehabilitation.  Dr. Fuad Aleskerov is Medical Director for [facility] Pulmonary Rehabilitation.     [1]  Social History Tobacco Use  Smoking Status Never  Smokeless Tobacco Never

## 2024-11-28 ENCOUNTER — Encounter

## 2024-11-30 ENCOUNTER — Encounter

## 2024-12-01 ENCOUNTER — Encounter

## 2024-12-05 ENCOUNTER — Encounter

## 2025-01-17 ENCOUNTER — Ambulatory Visit: Admitting: Family Medicine

## 2025-01-23 ENCOUNTER — Ambulatory Visit: Admitting: Cardiovascular Disease

## 2025-05-22 ENCOUNTER — Telehealth: Admitting: Adult Health
